# Patient Record
Sex: Female | Born: 1965 | Hispanic: No | Smoking: Current some day smoker
Health system: Southern US, Community
[De-identification: ages and names within clinical notes are randomized; demographics above are authoritative.]

## PROBLEM LIST (undated history)

## (undated) DIAGNOSIS — I2510 Atherosclerotic heart disease of native coronary artery without angina pectoris: Secondary | ICD-10-CM

## (undated) DIAGNOSIS — J45909 Unspecified asthma, uncomplicated: Secondary | ICD-10-CM

## (undated) DIAGNOSIS — F329 Major depressive disorder, single episode, unspecified: Secondary | ICD-10-CM

## (undated) DIAGNOSIS — F32A Depression, unspecified: Secondary | ICD-10-CM

## (undated) DIAGNOSIS — E049 Nontoxic goiter, unspecified: Secondary | ICD-10-CM

## (undated) DIAGNOSIS — M51369 Other intervertebral disc degeneration, lumbar region without mention of lumbar back pain or lower extremity pain: Secondary | ICD-10-CM

## (undated) DIAGNOSIS — Z87898 Personal history of other specified conditions: Secondary | ICD-10-CM

## (undated) DIAGNOSIS — M940 Chondrocostal junction syndrome [Tietze]: Secondary | ICD-10-CM

## (undated) DIAGNOSIS — E079 Disorder of thyroid, unspecified: Secondary | ICD-10-CM

## (undated) DIAGNOSIS — M5136 Other intervertebral disc degeneration, lumbar region: Secondary | ICD-10-CM

## (undated) HISTORY — DX: Depression, unspecified: F32.A

## (undated) HISTORY — DX: Atherosclerotic heart disease of native coronary artery without angina pectoris: I25.10

## (undated) HISTORY — PX: APPENDECTOMY: SHX54

## (undated) HISTORY — PX: TUBAL LIGATION: SHX77

## (undated) HISTORY — DX: Nontoxic goiter, unspecified: E04.9

## (undated) HISTORY — PX: THYROIDECTOMY: SHX17

## (undated) HISTORY — DX: Major depressive disorder, single episode, unspecified: F32.9

## (undated) HISTORY — DX: Other intervertebral disc degeneration, lumbar region without mention of lumbar back pain or lower extremity pain: M51.369

## (undated) HISTORY — DX: Chondrocostal junction syndrome (tietze): M94.0

## (undated) HISTORY — DX: Unspecified asthma, uncomplicated: J45.909

## (undated) HISTORY — DX: Personal history of other specified conditions: Z87.898

## (undated) HISTORY — DX: Disorder of thyroid, unspecified: E07.9

## (undated) HISTORY — DX: Other intervertebral disc degeneration, lumbar region: M51.36

---

## 2002-10-26 DIAGNOSIS — Z86718 Personal history of other venous thrombosis and embolism: Secondary | ICD-10-CM

## 2002-10-26 HISTORY — DX: Personal history of other venous thrombosis and embolism: Z86.718

## 2002-10-30 HISTORY — PX: VAGINAL HYSTERECTOMY: SUR661

## 2017-12-07 ENCOUNTER — Ambulatory Visit: Payer: Self-pay | Admitting: Urology

## 2017-12-07 VITALS — BP 106/70 | HR 72 | Temp 98.9°F

## 2017-12-07 DIAGNOSIS — M542 Cervicalgia: Secondary | ICD-10-CM

## 2017-12-07 DIAGNOSIS — J4520 Mild intermittent asthma, uncomplicated: Secondary | ICD-10-CM

## 2017-12-07 DIAGNOSIS — G8929 Other chronic pain: Secondary | ICD-10-CM

## 2017-12-07 DIAGNOSIS — H538 Other visual disturbances: Secondary | ICD-10-CM

## 2017-12-07 DIAGNOSIS — F319 Bipolar disorder, unspecified: Secondary | ICD-10-CM

## 2017-12-07 MED ORDER — IPRATROPIUM-ALBUTEROL 0.5-2.5 (3) MG/3ML IN SOLN: 3.0000 mL | Freq: Four times a day (QID) | RESPIRATORY_TRACT | 0 refills | Status: DC | PRN

## 2017-12-07 MED ORDER — CYCLOBENZAPRINE HCL 10 MG PO TABS: 10.0000 mg | ORAL_TABLET | Freq: Three times a day (TID) | ORAL | 0 refills | Status: DC

## 2017-12-07 MED ORDER — ALBUTEROL SULFATE HFA 108 (90 BASE) MCG/ACT IN AERS: 2.0000 | INHALATION_SPRAY | Freq: Four times a day (QID) | RESPIRATORY_TRACT | 2 refills | Status: DC | PRN

## 2017-12-07 NOTE — Addendum Note (Signed)
Addended by: Michiel CowboyMCGOWAN, Carlee Vonderhaar A on: 12/07/2017 08:19 PM   Modules accepted: Orders

## 2017-12-07 NOTE — Progress Notes (Signed)
Patient: Diana Galvan Female    DOB: 26-Jun-1966   51 y.o.   MRN: [id number] Visit Date: 12/07/2017  Today's Provider: Michiel CowboySHANNON Rockie Schnoor, PA-C   Chief Complaint  Patient presents with  . Headache    continuous for the past few days; concerned about vision    . Nausea   Subjective:    HPI HA started three days ago.  It starts in the lower part of her skull over the top of her skull to behind her eye on the right.  She has been dizzy.  Onset was gradual.  She is having nausea.  It is an ache.  Vision is blurry.  She has a history of migraines  Visual troubles needs to wear readers.  No loss of vision.    Neck feels like it is crooked and needs to be straightened.  She has bilateral arm numbness when she wakes up in the morning.   This has been occurring for the last two to three years.  It has been getting worse over the last 6 months.   She has pain in the upper trapezius bilaterally.  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  Been told she has scoliosis, but she doesn't believe it.    Has a history of asthma.  Has an expired inhaler and a nebulizer at home.  She has used the inhaler twice last week.  She had to use the nebulizer in 09/2017.    Has a temporary denture.  Would like to see dentist.    Has a history of bi-polar disease  Recently relocated from [place].      Allergies not on file This SmartLink is deprecated. Use AVSMEDLIST instead to display the medication list for a patient.  Review of Systems  Constitutional: Negative.   HENT: Negative.   Eyes: Positive for visual disturbance.  Respiratory: Negative.   Cardiovascular: Negative.   Gastrointestinal: Positive for nausea.  Endocrine: Negative.   Genitourinary: Negative.   Musculoskeletal: Positive for back pain, neck pain and neck stiffness.  Neurological: Positive for dizziness and headaches.  Hematological: Negative.   Psychiatric/Behavioral: Negative.     Social History   Tobacco Use  . Smoking status:  Current Every Day Smoker    Packs/day: 0.20    Types: Cigarettes  . Smokeless tobacco: Never Used  Substance Use Topics  . Alcohol use: Yes    Alcohol/week: 0.6 oz    Types: 1 Glasses of wine per week   Objective:   BP 106/70 (BP Location: Left Arm, Patient Position: Sitting, Cuff Size: Normal)   Pulse 72   Temp 98.9 F (37.2 C) (Oral)   Physical Exam Constitutional: Well nourished. Alert and oriented, No acute distress. HEENT: [place] AT, moist mucus membranes. Trachea midline, no masses. Cardiovascular: No clubbing, cyanosis, or edema. Respiratory: Normal respiratory effort, no increased work of breathing. GI: Abdomen is soft, non tender, non distended, no abdominal masses. Liver and spleen not palpable.  No hernias appreciated.  Stool sample for occult testing is not indicated.   GU: No CVA tenderness.  No bladder fullness or masses.   Skin: No rashes, bruises or suspicious lesions. Lymph: No cervical or inguinal adenopathy. Neurologic: Grossly intact, no focal deficits, moving all 4 extremities.  No severe curvature in spine.  Traps tender bilaterally.   Psychiatric: Normal mood and affect.      Assessment & Plan:   1. Visual changes  - needs an eye exam   2. Neck pain  - start  Flexeril 10 mg tid  - appointment with Dr. Justice RocherFossier  3. Headache  - ? Due to neck issues vs visual issues vs migraines  - will reassess once labs are resulted, appointment with Dr. Justice RocherFossier and eye exam  4. Asthma  - refilled albuterol inhaler and nebulizer medications  5. Bi-polar disease  - needs appointment with Heather  Labs drawn today - CBC, TSH, lipids, CMP and HbgA1c     Jakyren Fluegge, PA-C   [facility] [place]

## 2017-12-08 LAB — TSH: TSH: 0.833 u[IU]/mL (ref 0.450–4.500)

## 2017-12-08 LAB — HEMOGLOBIN A1C
Est. average glucose Bld gHb Est-mCnc: 103 mg/dL
Hgb A1c MFr Bld: 5.2 % (ref 4.8–5.6)

## 2017-12-08 LAB — CBC WITH DIFFERENTIAL/PLATELET
BASOS ABS: 0 10*3/uL (ref 0.0–0.2)
BASOS: 0 %
EOS (ABSOLUTE): 0.2 10*3/uL (ref 0.0–0.4)
Eos: 2 %
HEMOGLOBIN: 10.8 g/dL — AB (ref 11.1–15.9)
Hematocrit: 33.3 % — ABNORMAL LOW (ref 34.0–46.6)
IMMATURE GRANS (ABS): 0 10*3/uL (ref 0.0–0.1)
IMMATURE GRANULOCYTES: 0 %
LYMPHS: 50 %
Lymphocytes Absolute: 3.5 10*3/uL — ABNORMAL HIGH (ref 0.7–3.1)
MCH: 28.7 pg (ref 26.6–33.0)
MCHC: 32.4 g/dL (ref 31.5–35.7)
MCV: 89 fL (ref 79–97)
MONOCYTES: 5 %
Monocytes Absolute: 0.4 10*3/uL (ref 0.1–0.9)
NEUTROS ABS: 3.1 10*3/uL (ref 1.4–7.0)
NEUTROS PCT: 43 %
PLATELETS: 353 10*3/uL (ref 150–379)
RBC: 3.76 x10E6/uL — ABNORMAL LOW (ref 3.77–5.28)
RDW: 13.7 % (ref 12.3–15.4)
WBC: 7.1 10*3/uL (ref 3.4–10.8)

## 2017-12-08 LAB — LIPID PANEL
CHOLESTEROL TOTAL: 181 mg/dL (ref 100–199)
Chol/HDL Ratio: 2.9 ratio (ref 0.0–4.4)
HDL: 63 mg/dL (ref >39)
LDL CALC: 98 mg/dL (ref 0–99)
TRIGLYCERIDES: 100 mg/dL (ref 0–149)
VLDL CHOLESTEROL CAL: 20 mg/dL (ref 5–40)

## 2017-12-08 LAB — COMPREHENSIVE METABOLIC PANEL
ALBUMIN: 4.7 g/dL (ref 3.5–5.5)
ALT: 19 IU/L (ref 0–32)
AST: 23 IU/L (ref 0–40)
Albumin/Globulin Ratio: 1.9 (ref 1.2–2.2)
Alkaline Phosphatase: 79 IU/L (ref 39–117)
BUN / CREAT RATIO: 21 (ref 9–23)
BUN: 15 mg/dL (ref 6–24)
Bilirubin Total: <0.2 mg/dL (ref 0.0–1.2)
CALCIUM: 9.3 mg/dL (ref 8.7–10.2)
CHLORIDE: 106 mmol/L (ref 96–106)
CO2: 23 mmol/L (ref 20–29)
CREATININE: 0.71 mg/dL (ref 0.57–1.00)
GFR, EST AFRICAN AMERICAN: 113 mL/min/{1.73_m2} (ref >59)
GFR, EST NON AFRICAN AMERICAN: 98 mL/min/{1.73_m2} (ref >59)
GLUCOSE: 86 mg/dL (ref 65–99)
Globulin, Total: 2.5 g/dL (ref 1.5–4.5)
Potassium: 4.6 mmol/L (ref 3.5–5.2)
Sodium: 144 mmol/L (ref 134–144)
TOTAL PROTEIN: 7.2 g/dL (ref 6.0–8.5)

## 2017-12-20 ENCOUNTER — Ambulatory Visit: Payer: Self-pay | Admitting: Pharmacy Technician

## 2017-12-20 DIAGNOSIS — Z79899 Other long term (current) drug therapy: Secondary | ICD-10-CM

## 2017-12-20 NOTE — Progress Notes (Signed)
Completed [facility] application and contract.  Patient agreed to all terms of the [facility] contract.    Patient approved to receive medication assistance at [facility], as long as eligibility criteria continues to be met.    Provided patient with community resource material based on her particular needs.    [facility]

## 2017-12-21 ENCOUNTER — Ambulatory Visit: Payer: Self-pay | Admitting: Ophthalmology

## 2017-12-27 ENCOUNTER — Ambulatory Visit: Payer: Self-pay | Admitting: Chiropractor

## 2017-12-28 ENCOUNTER — Ambulatory Visit: Payer: Self-pay | Admitting: Ophthalmology

## 2018-01-05 ENCOUNTER — Telehealth: Payer: Self-pay

## 2018-01-05 NOTE — Telephone Encounter (Signed)
-----   Message from Harle BattiestShannon A McGowan, PA-C sent at 01/04/2018  6:53 PM EST ----- Please let Mrs. Antionette CharHayward that she is anemic.  Does she have a history of anemia?  If not, she needs a return appointment.

## 2018-01-05 NOTE — Telephone Encounter (Signed)
Left mess to call 

## 2018-01-08 NOTE — Telephone Encounter (Signed)
Made pt aware of lab results but pt is a [facility] pt. Pt had several questions about anemia and her past dx. Did not make pt a f/u appt with [facility].

## 2018-01-09 ENCOUNTER — Ambulatory Visit: Payer: Self-pay

## 2018-01-10 ENCOUNTER — Ambulatory Visit: Payer: Self-pay | Admitting: Chiropractor

## 2018-01-18 ENCOUNTER — Ambulatory Visit: Payer: Self-pay | Admitting: Ophthalmology

## 2018-01-24 ENCOUNTER — Encounter: Payer: Self-pay | Admitting: Chiropractor

## 2018-01-24 ENCOUNTER — Ambulatory Visit: Payer: Self-pay | Admitting: Ophthalmology

## 2018-01-24 ENCOUNTER — Ambulatory Visit: Payer: Self-pay | Admitting: Chiropractor

## 2018-01-24 ENCOUNTER — Ambulatory Visit: Payer: Self-pay | Admitting: Internal Medicine

## 2018-01-24 VITALS — BP 111/73 | HR 68 | Temp 98.3°F | Wt 245.8 lb

## 2018-01-24 DIAGNOSIS — M9906 Segmental and somatic dysfunction of lower extremity: Secondary | ICD-10-CM | POA: Insufficient documentation

## 2018-01-24 DIAGNOSIS — M9903 Segmental and somatic dysfunction of lumbar region: Secondary | ICD-10-CM | POA: Insufficient documentation

## 2018-01-24 DIAGNOSIS — M62830 Muscle spasm of back: Secondary | ICD-10-CM | POA: Insufficient documentation

## 2018-01-24 DIAGNOSIS — M9901 Segmental and somatic dysfunction of cervical region: Secondary | ICD-10-CM | POA: Insufficient documentation

## 2018-01-24 DIAGNOSIS — D649 Anemia, unspecified: Secondary | ICD-10-CM

## 2018-01-24 DIAGNOSIS — M25511 Pain in right shoulder: Secondary | ICD-10-CM

## 2018-01-24 NOTE — Progress Notes (Signed)
   Subjective:    Patient ID: Diana Galvan, female    DOB: 1966-12-06, 52 y.o.   MRN: [id number]  HPI   Pt is here for a f/u. She is concerned about weight gain and states that she has gained 25 lbs since Oct. 2018.   She also stated that she is having right shoulder pain. She had an xray done about a year ago and they found a bone spur.   She has a previous diagnosis of diverticulitis.   She had a colonoscopy done and had a polyp removed. She also has hemorrhoids.   Pt has urinary incontinence that was not fixed with her hysterectomy.  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 She has a family history of congestive heart failure.  Allergies as of 01/24/2018   Not on File     Medication List        Accurate as of 01/24/18 11:16 AM. Always use your most recent med list.          albuterol 108 (90 Base) MCG/ACT inhaler Commonly known as:  PROVENTIL HFA;VENTOLIN HFA Inhale 2 puffs into the lungs every 6 (six) hours as needed for wheezing or shortness of breath.   cyclobenzaprine 10 MG tablet Commonly known as:  FLEXERIL Take 1 tablet (10 mg total) by mouth 3 (three) times daily.   ipratropium-albuterol 0.5-2.5 (3) MG/3ML Soln Commonly known as:  DUONEB Take 3 mLs by nebulization every 6 (six) hours as needed.      Patient Active Problem List   Diagnosis Date Noted  . Segmental dysfunction of cervical region 01/24/2018  . Muscle spasm of back 01/24/2018  . Segmental dysfunction of lumbar region 01/24/2018  . Segmental dysfunction of lower extremity 01/24/2018     Review of Systems     Objective:   Physical Exam  Constitutional: She is oriented to person, place, and time.  Cardiovascular: Normal rate, regular rhythm and normal heart sounds.  Pulmonary/Chest: Effort normal and breath sounds normal.  Neurological: She is alert and oriented to person, place, and time.   BP 111/73   Pulse 68   Temp 98.3 F (36.8 C)   Wt 245 lb 12.8 oz (111.5 kg)       Assessment & Plan:   Labs ordered today: CBC, B12, TIBC, iron serum, folic acid, reticulocyte (retic), UA  Get [place] records from [place] (she recently moved here).   Order x-ray for right shoulder pain. Pt needs to fill out charity care application. Referral for orthopedic (Dr. Justice RocherFossier).

## 2018-01-24 NOTE — Progress Notes (Signed)
S: Patient presents with right sided neck pain. Patient states shes had it for years, however much worse over the last year. Pain is made worse by laying on back, looking down, and having sex. Made better by aromatherapy (doterra oils). Pain is achey and sharp. Pain radiates into right shoulder along the upper trap. Pain is an 8/10. Pain is constant and severe.   Patient also has right hip pain. Patient states she has also had this for years, made worse in the last year. Pain made worse by walking, sitting, standing up, twisting. Patient states sometimes she can twist and the pain will bring her to her knees. Pain made better by stretching. Pain is sharp. Pain does not radiate. Pain is a 3/10 usually, however becomes 10/10 when she moves wrong. Pain is constant and can be severe.   No loss of bowel or bladder control.   O:  Posture - Right head tilt, left high shoulder, right high hip, anterior head carriage, increased lumbar lordosis.   ROM - Csp and Lsp AROM WNL no pain.   Segmental - C2 L, C6R, T2 R, T8 R, L5 R, SI R, Hip R  TTF - Csp L, Tsp R, Lsp R paraspinals.   Pain - Pain at all segments listed above and in muscles listed above.   Ortho - SLR, braggards, kemps, Csp & Lsp valsalva, yeomans, hibbs, max foraminal csp compression, Csp distraction all negative. Skipper ClichePatrick FABERE positive on right.  Neuro -  Strength C5-T1 and L4-S1 were 5 Reflex C5-T1 and L4-S1 were 2 Sensory - C5-T1 and L4-S1 normal bilateral.  A: Prognosis is guarded. Patient's first visit. 1x EOW as available in clinic.  P: SMT at levels listed above. Ext. Manipulation of right hip. ROF completed prior to Tx. PARNB.

## 2018-01-25 LAB — URINALYSIS
BILIRUBIN UA: NEGATIVE
Glucose, UA: NEGATIVE
Ketones, UA: NEGATIVE
Leukocytes, UA: NEGATIVE
Nitrite, UA: NEGATIVE
PH UA: 6 (ref 5.0–7.5)
Protein, UA: NEGATIVE
Specific Gravity, UA: 1.023 (ref 1.005–1.030)
Urobilinogen, Ur: 0.2 mg/dL (ref 0.2–1.0)

## 2018-01-31 ENCOUNTER — Other Ambulatory Visit: Payer: Self-pay

## 2018-02-02 ENCOUNTER — Telehealth: Payer: Self-pay

## 2018-02-02 NOTE — Telephone Encounter (Signed)
Called to r/s pt lab appt for 2/13. Lm for pt to call back to confirm

## 2018-02-07 ENCOUNTER — Ambulatory Visit: Payer: Self-pay | Admitting: Chiropractor

## 2018-02-07 ENCOUNTER — Encounter: Payer: Self-pay | Admitting: Chiropractor

## 2018-02-07 ENCOUNTER — Other Ambulatory Visit: Payer: Self-pay

## 2018-02-07 DIAGNOSIS — M9906 Segmental and somatic dysfunction of lower extremity: Secondary | ICD-10-CM

## 2018-02-07 DIAGNOSIS — M9903 Segmental and somatic dysfunction of lumbar region: Secondary | ICD-10-CM

## 2018-02-07 DIAGNOSIS — M9901 Segmental and somatic dysfunction of cervical region: Secondary | ICD-10-CM

## 2018-02-07 DIAGNOSIS — M62830 Muscle spasm of back: Secondary | ICD-10-CM

## 2018-02-07 DIAGNOSIS — J45909 Unspecified asthma, uncomplicated: Secondary | ICD-10-CM

## 2018-02-07 MED ORDER — IPRATROPIUM-ALBUTEROL 0.5-2.5 (3) MG/3ML IN SOLN: 3.0000 mL | Freq: Four times a day (QID) | RESPIRATORY_TRACT | 1 refills | Status: DC | PRN

## 2018-02-07 MED ORDER — ALBUTEROL SULFATE HFA 108 (90 BASE) MCG/ACT IN AERS: 2.0000 | INHALATION_SPRAY | Freq: Four times a day (QID) | RESPIRATORY_TRACT | 2 refills | Status: DC | PRN

## 2018-02-07 NOTE — Progress Notes (Signed)
S: Patient presents with right sided neck pain. Patient states it is 50% better since LPV. Patient states shes had it for years, however much worse over the last year. Pain is made worse by laying on back, looking down, and having sex. Made better by aromatherapy (doterra oils). Pain is achey and sharp. Pain radiates into right shoulder along the upper trap. Pain is an 4/10. Pain is constant and severe.   Patient also has right hip pain. Patient states she has also had this for years, made worse in the last year. Patient states it is considerably better since LPV. Pain made worse by walking, sitting, standing up, twisting. Patient states sometimes she can twist and the pain will bring her to her knees. Pain made better by stretching. Pain is sharp. Pain does not radiate. Pain is a 2/10 usually, however becomes 6/10 when she moves wrong. Pain is constant and can be severe.   No loss of bowel or bladder control.   Patient states when she sweats or goes under how water, the exposed skin becomes extremely itchy. Patient also states that since she moved to this area, whenever she eats her tongue burns.  O:  Posture - Right head tilt, left high shoulder, right high hip, anterior head carriage, increased lumbar lordosis.   ROM - Csp and Lsp AROM WNL no pain.   Segmental - C2 L, C6R, T2 R, T8 R, L5 R, SI R, Hip R  TTF - Csp L, Tsp R, Lsp R paraspinals.   Pain - Pain at all segments listed above and in muscles listed above.   Ortho - SLR, braggards, kemps, Csp & Lsp valsalva, yeomans, hibbs, max foraminal csp compression, Csp distraction all negative. Skipper ClichePatrick FABERE positive on right.  Neuro -  Strength C5-T1 and L4-S1 were 5 Reflex C5-T1 and L4-S1 were 2 Sensory - C5-T1 and L4-S1 normal bilateral.  A: Prognosis is good. Patient's first visit. 1x EOW as available in clinic.  P: SMT at levels listed above. Ext. Manipulation of right hip. ROF completed prior to Tx. PARNB. Referred patient to Dr.  Candelaria Stagershaplin for skin itching and tongue burning.

## 2018-02-14 ENCOUNTER — Ambulatory Visit: Payer: Self-pay | Admitting: Internal Medicine

## 2018-02-14 ENCOUNTER — Ambulatory Visit: Payer: Self-pay | Admitting: Ophthalmology

## 2018-02-21 ENCOUNTER — Telehealth: Payer: Self-pay

## 2018-02-21 NOTE — Telephone Encounter (Signed)
Lm with next avail am appts for dr hatch and dr Candelaria Stagerschaplin. Nothing avail until 4/3. Asked for a call back to schedule

## 2018-03-05 ENCOUNTER — Telehealth: Payer: Self-pay | Admitting: Pharmacy Technician

## 2018-03-05 NOTE — Telephone Encounter (Signed)
Patient failed to provide 2019 financial documentation.  No additional medication assistance will be provided by [facility] without the required proof of income documentation.  Patient notified by letter.  [place] J. Geza Beranek Care Manager [facility] 

## 2018-03-29 ENCOUNTER — Ambulatory Visit: Payer: Self-pay

## 2018-04-05 ENCOUNTER — Ambulatory Visit: Payer: Self-pay

## 2018-04-12 ENCOUNTER — Ambulatory Visit: Payer: Self-pay

## 2018-08-22 DIAGNOSIS — F319 Bipolar disorder, unspecified: Secondary | ICD-10-CM | POA: Insufficient documentation

## 2018-08-22 DIAGNOSIS — Z87898 Personal history of other specified conditions: Secondary | ICD-10-CM | POA: Insufficient documentation

## 2018-08-22 DIAGNOSIS — Z972 Presence of dental prosthetic device (complete) (partial): Secondary | ICD-10-CM | POA: Insufficient documentation

## 2018-08-22 DIAGNOSIS — Z91419 Personal history of unspecified adult abuse: Secondary | ICD-10-CM | POA: Insufficient documentation

## 2018-08-22 DIAGNOSIS — F1991 Other psychoactive substance use, unspecified, in remission: Secondary | ICD-10-CM | POA: Insufficient documentation

## 2018-08-22 LAB — HM HIV SCREENING LAB: HM HIV Screening: NEGATIVE

## 2018-09-03 DIAGNOSIS — A539 Syphilis, unspecified: Secondary | ICD-10-CM | POA: Insufficient documentation

## 2018-10-25 ENCOUNTER — Other Ambulatory Visit: Payer: Self-pay

## 2018-10-25 DIAGNOSIS — Z0000 Encounter for general adult medical examination without abnormal findings: Secondary | ICD-10-CM

## 2018-12-05 ENCOUNTER — Ambulatory Visit: Payer: Self-pay | Admitting: Ophthalmology

## 2019-03-28 ENCOUNTER — Other Ambulatory Visit: Payer: Self-pay

## 2019-03-28 ENCOUNTER — Encounter: Payer: Self-pay | Admitting: Gerontology

## 2019-03-28 ENCOUNTER — Ambulatory Visit: Payer: Self-pay | Admitting: Gerontology

## 2019-03-28 DIAGNOSIS — Z8719 Personal history of other diseases of the digestive system: Secondary | ICD-10-CM

## 2019-03-28 DIAGNOSIS — K6289 Other specified diseases of anus and rectum: Secondary | ICD-10-CM

## 2019-03-28 DIAGNOSIS — R05 Cough: Secondary | ICD-10-CM

## 2019-03-28 DIAGNOSIS — R059 Cough, unspecified: Secondary | ICD-10-CM

## 2019-03-28 DIAGNOSIS — J4520 Mild intermittent asthma, uncomplicated: Secondary | ICD-10-CM

## 2019-03-28 DIAGNOSIS — M25511 Pain in right shoulder: Secondary | ICD-10-CM

## 2019-03-28 DIAGNOSIS — Z0000 Encounter for general adult medical examination without abnormal findings: Secondary | ICD-10-CM

## 2019-03-28 MED ORDER — BENZONATATE 100 MG PO CAPS: 100.0000 mg | ORAL_CAPSULE | Freq: Three times a day (TID) | ORAL | 0 refills | Status: DC | PRN

## 2019-03-28 MED ORDER — WITCH HAZEL-GLYCERIN EX PADS: 1.0000 "application " | MEDICATED_PAD | CUTANEOUS | 0 refills | Status: DC | PRN

## 2019-03-28 MED ORDER — CYCLOBENZAPRINE HCL 10 MG PO TABS: 10.0000 mg | ORAL_TABLET | Freq: Every day | ORAL | 0 refills | Status: DC

## 2019-03-28 NOTE — Progress Notes (Signed)
Established Patient Office Visit  Subjective:  Patient ID: Diana Galvan, female    DOB: October 25, 1966  Age: 53 y.o. MRN: [id number]  CC:  Chief Complaint  Patient presents with  . Follow-up    asthma, cough, shortness of breath    [facility] for follow up for cough and asthma. She states that she's being having non productive cough that has being going on for 2 weeks, and it keeps her up at night. She reports that she has not taking any otc medication. She states that her temperature was 98.69F during telephone visit. She denies chest pain, palpitation, chest tightness and wheezing. She reports having intermittent shortness of breath with activity such as walking 3 blocks. She states that she uses nebulizer treatment, but  has not being using albuterol inhaler because she read that steroid should not be used during a pandemic.    Right shoulder pain: She states that she continues to experience constant 6/10 non radiating sharp pain to right shoulder. She states that it has being going on for over 5 years. She states that she was out of 10 mg Flexeril  because she didn't follow up with her appointment and the prescription expired.  Rectal pain: She states that she had a history of hemmorrhoid and had colonoscopy 2-3 years ago with the removal of a benign polyps. Information was not found. She reports that 3 weeks ago she had 3 episodes of bright red blood after having a bowel movement. She states that since then she has being having intermittent 5/10 dull rectal pain when sitting. Per patient, she was informed after the colonoscopy that she has internal and external hemorrhoid. Currently, she denies constipation, diarrhea, abdominal pain, rectal bleed or dark tarry stool , and no further concern.   Past Medical History:  Diagnosis Date  . Asthma   . Depression     Past Surgical History:  Procedure Laterality Date  . VAGINAL HYSTERECTOMY  10/30/2002     Family History  Problem Relation Age of Onset  . Mental illness Mother   . Congestive Heart Failure Paternal Aunt     Social History   Socioeconomic History  . Marital status: Divorced    Spouse name: Not on file  . Number of children: Not on file  . Years of education: Not on file  . Highest education level: Not on file  Occupational History  . Not on file  Social Needs  . Financial resource strain: Not on file  . Food insecurity:    Worry: Not on file    Inability: Not on file  . Transportation needs:    Medical: Not on file    Non-medical: Not on file  Tobacco Use  . Smoking status: Current Some Day Smoker    Packs/day: 0.20    Types: Cigarettes  . Smokeless tobacco: Never Used  Substance and Sexual Activity  . Alcohol use: Yes    Alcohol/week: 1.0 standard drinks    Types: 1 Glasses of wine per week  . Drug use: No  . Sexual activity: Yes    Partners: Male  Lifestyle  . Physical activity:    Days per week: Not on file    Minutes per session: Not on file  . Stress: Not on file  Relationships  . Social connections:    Talks on phone: Not on file    Gets together: Not on file    Attends religious service: Not on file  Active member of club or organization: Not on file    Attends meetings of clubs or organizations: Not on file    Relationship status: Not on file  . Intimate partner violence:    Fear of current or ex partner: Not on file    Emotionally abused: Not on file    Physically abused: Not on file    Forced sexual activity: Not on file  Other Topics Concern  . Not on file  Social History Narrative  . Not on file    Outpatient Medications Prior to Visit  Medication Sig Dispense Refill  . albuterol (PROVENTIL HFA;VENTOLIN HFA) 108 (90 Base) MCG/ACT inhaler Inhale 2 puffs into the lungs every 6 (six) hours as needed for wheezing or shortness of breath. 1 Inhaler 2  . ipratropium-albuterol (DUONEB) 0.5-2.5 (3) MG/3ML SOLN Take 3 mLs by  nebulization every 6 (six) hours as needed. 360 mL 1  . cyclobenzaprine (FLEXERIL) 10 MG tablet Take 1 tablet (10 mg total) by mouth 3 (three) times daily. (Patient not taking: Reported on 03/28/2019) 30 tablet 0   No facility-administered medications prior to visit.     No Known Allergies  ROS Review of Systems  Constitutional: Negative for chills and fever.  HENT: Positive for sinus pressure. Negative for rhinorrhea, sneezing, sore throat and tinnitus.   Respiratory: Positive for cough (non productive cough for 2 weeks) and shortness of breath (intermittent with activity). Negative for chest tightness and wheezing.   Cardiovascular: Negative.   Musculoskeletal: Positive for arthralgias (chronic right shoulder pain).  Skin: Negative.   Neurological: Negative.   Psychiatric/Behavioral: Negative.       Objective:    Physical Exam   No vital signs and Physical exam was done, visit was via Telephone.  There were no vitals taken for this visit. Wt Readings from Last 3 Encounters:  01/24/18 245 lb 12.8 oz (111.5 kg)     Health Maintenance Due  Topic Date Due  . HIV Screening  11/14/1980  . TETANUS/TDAP  11/14/1984  . PAP SMEAR-Modifier  11/14/1986  . MAMMOGRAM  11/15/2015  . COLONOSCOPY  11/15/2015    There are no preventive care reminders to display for this patient.  Lab Results  Component Value Date   TSH 0.833 12/07/2017   Lab Results  Component Value Date   WBC 7.1 12/07/2017   HGB 10.8 (L) 12/07/2017   HCT 33.3 (L) 12/07/2017   MCV 89 12/07/2017   PLT 353 12/07/2017   Lab Results  Component Value Date   NA 144 12/07/2017   K 4.6 12/07/2017   CO2 23 12/07/2017   GLUCOSE 86 12/07/2017   BUN 15 12/07/2017   CREATININE 0.71 12/07/2017   BILITOT <0.2 12/07/2017   ALKPHOS 79 12/07/2017   AST 23 12/07/2017   ALT 19 12/07/2017   PROT 7.2 12/07/2017   ALBUMIN 4.7 12/07/2017   CALCIUM 9.3 12/07/2017   Lab Results  Component Value Date   CHOL 181  12/07/2017   Lab Results  Component Value Date   HDL 63 12/07/2017   Lab Results  Component Value Date   LDLCALC 98 12/07/2017   Lab Results  Component Value Date   TRIG 100 12/07/2017   Lab Results  Component Value Date   CHOLHDL 2.9 12/07/2017   Lab Results  Component Value Date   HGBA1C 5.2 12/07/2017      Assessment & Plan:      1. Cough - She will continue on benzonatate, she will notify  provider for worsening symptoms. - benzonatate (TESSALON PERLES) 100 MG capsule; Take 1 capsule (100 mg total) by mouth 3 (three) times daily as needed for cough.  Dispense: 20 capsule; Refill: 0  2. Preventative health care - Routine labs will be collected next week - Urinalysis; Future - TSH; Future - Lipid panel; Future - HgB A1c; Future - Comp Met (CMET); Future - CBC w/Diff; Future - Lipid panel; Future - B12; Future  3. Rectal pain - She was encouraged to complete charity care application for Ambulatory referral to Gastroenterology  4. Mild intermittent asthma without complication - She was encouraged to use Albuterol as needed.  5. Right shoulder pain, unspecified chronicity - She will follow up with Dr Vickki Hearing [facility] - cyclobenzaprine (FLEXERIL) 10 MG tablet; Take 1 tablet (10 mg total) by mouth at bedtime.  Dispense: 30 tablet; Refill: 0  6. History of hemorrhoids -She will continue witch hazel, and notify provider for worsening symptoms.  She was educated on medication side effect and advised to notify clinic. - witch hazel-glycerin (TUCKS) pad; Apply 1 application topically as needed for itching or hemorrhoids.  Dispense: 40 each; Refill: 0  Follow-up: Return in about 2 weeks (around 04/11/2019), or if symptoms worsen or fail to improve.    Junaid Wurzer Jerold Coombe, NP

## 2019-03-29 ENCOUNTER — Encounter: Payer: Self-pay | Admitting: Gastroenterology

## 2019-04-01 ENCOUNTER — Other Ambulatory Visit: Payer: Self-pay

## 2019-04-01 ENCOUNTER — Encounter: Payer: Self-pay | Admitting: *Deleted

## 2019-04-01 ENCOUNTER — Ambulatory Visit: Payer: Self-pay | Admitting: Gastroenterology

## 2019-04-02 ENCOUNTER — Other Ambulatory Visit: Payer: Self-pay

## 2019-04-02 DIAGNOSIS — Z8719 Personal history of other diseases of the digestive system: Secondary | ICD-10-CM

## 2019-04-02 DIAGNOSIS — J45909 Unspecified asthma, uncomplicated: Secondary | ICD-10-CM

## 2019-04-02 MED ORDER — IPRATROPIUM-ALBUTEROL 0.5-2.5 (3) MG/3ML IN SOLN: 3.0000 mL | Freq: Four times a day (QID) | RESPIRATORY_TRACT | 1 refills | Status: DC | PRN

## 2019-04-02 MED ORDER — ALBUTEROL SULFATE HFA 108 (90 BASE) MCG/ACT IN AERS: 2.0000 | INHALATION_SPRAY | Freq: Four times a day (QID) | RESPIRATORY_TRACT | 2 refills | Status: DC | PRN

## 2019-04-02 MED ORDER — WITCH HAZEL-GLYCERIN EX PADS: 1.0000 "application " | MEDICATED_PAD | CUTANEOUS | 0 refills | Status: DC | PRN

## 2019-04-02 NOTE — Progress Notes (Signed)
Pt asked for medication to be sent to different pharmacy.

## 2019-04-03 ENCOUNTER — Other Ambulatory Visit: Payer: Self-pay

## 2019-05-01 ENCOUNTER — Other Ambulatory Visit: Payer: Self-pay

## 2019-05-01 DIAGNOSIS — Z0000 Encounter for general adult medical examination without abnormal findings: Secondary | ICD-10-CM

## 2019-05-03 LAB — CBC WITH DIFFERENTIAL/PLATELET
Basophils Absolute: 0 10*3/uL (ref 0.0–0.2)
Basos: 0 %
EOS (ABSOLUTE): 0.1 10*3/uL (ref 0.0–0.4)
Eos: 1 %
Hematocrit: 35 % (ref 34.0–46.6)
Hemoglobin: 11.5 g/dL (ref 11.1–15.9)
Immature Grans (Abs): 0 10*3/uL (ref 0.0–0.1)
Immature Granulocytes: 0 %
Lymphocytes Absolute: 4 10*3/uL — ABNORMAL HIGH (ref 0.7–3.1)
Lymphs: 43 %
MCH: 28.7 pg (ref 26.6–33.0)
MCHC: 32.9 g/dL (ref 31.5–35.7)
MCV: 87 fL (ref 79–97)
Monocytes Absolute: 0.4 10*3/uL (ref 0.1–0.9)
Monocytes: 4 %
Neutrophils Absolute: 4.8 10*3/uL (ref 1.4–7.0)
Neutrophils: 52 %
Platelets: 409 10*3/uL (ref 150–450)
RBC: 4.01 x10E6/uL (ref 3.77–5.28)
RDW: 13.1 % (ref 11.7–15.4)
WBC: 9.3 10*3/uL (ref 3.4–10.8)

## 2019-05-03 LAB — COMPREHENSIVE METABOLIC PANEL
ALT: 13 IU/L (ref 0–32)
AST: 18 IU/L (ref 0–40)
Albumin/Globulin Ratio: 1.8 (ref 1.2–2.2)
Albumin: 4.6 g/dL (ref 3.8–4.9)
Alkaline Phosphatase: 87 IU/L (ref 39–117)
BUN/Creatinine Ratio: 21 (ref 9–23)
BUN: 20 mg/dL (ref 6–24)
Bilirubin Total: 0.2 mg/dL (ref 0.0–1.2)
CO2: 22 mmol/L (ref 20–29)
Calcium: 9.4 mg/dL (ref 8.7–10.2)
Chloride: 103 mmol/L (ref 96–106)
Creatinine, Ser: 0.96 mg/dL (ref 0.57–1.00)
GFR calc Af Amer: 78 mL/min/{1.73_m2} (ref >59)
GFR calc non Af Amer: 68 mL/min/{1.73_m2} (ref >59)
Globulin, Total: 2.5 g/dL (ref 1.5–4.5)
Glucose: 87 mg/dL (ref 65–99)
Potassium: 3.7 mmol/L (ref 3.5–5.2)
Sodium: 140 mmol/L (ref 134–144)
Total Protein: 7.1 g/dL (ref 6.0–8.5)

## 2019-05-03 LAB — URINALYSIS
Bilirubin, UA: NEGATIVE
Glucose, UA: NEGATIVE
Ketones, UA: NEGATIVE
Leukocytes,UA: NEGATIVE
Nitrite, UA: NEGATIVE
Protein,UA: NEGATIVE
Specific Gravity, UA: >=1.03 — AB (ref 1.005–1.030)
Urobilinogen, Ur: 1 mg/dL (ref 0.2–1.0)
pH, UA: 5 (ref 5.0–7.5)

## 2019-05-03 LAB — TSH: TSH: 2.69 u[IU]/mL (ref 0.450–4.500)

## 2019-05-03 LAB — LIPID PANEL
Chol/HDL Ratio: 3.5 ratio (ref 0.0–4.4)
Cholesterol, Total: 176 mg/dL (ref 100–199)
HDL: 51 mg/dL (ref >39)
LDL Calculated: 109 mg/dL — ABNORMAL HIGH (ref 0–99)
Triglycerides: 78 mg/dL (ref 0–149)
VLDL Cholesterol Cal: 16 mg/dL (ref 5–40)

## 2019-05-03 LAB — VITAMIN B12: Vitamin B-12: 898 pg/mL (ref 232–1245)

## 2019-05-03 LAB — HEMOGLOBIN A1C
Est. average glucose Bld gHb Est-mCnc: 108 mg/dL
Hgb A1c MFr Bld: 5.4 % (ref 4.8–5.6)

## 2019-05-09 ENCOUNTER — Ambulatory Visit: Payer: Self-pay | Admitting: Gerontology

## 2019-05-09 ENCOUNTER — Other Ambulatory Visit: Payer: Self-pay

## 2019-05-09 DIAGNOSIS — J45909 Unspecified asthma, uncomplicated: Secondary | ICD-10-CM

## 2019-05-09 DIAGNOSIS — R059 Cough, unspecified: Secondary | ICD-10-CM

## 2019-05-09 DIAGNOSIS — R05 Cough: Secondary | ICD-10-CM

## 2019-05-09 DIAGNOSIS — R899 Unspecified abnormal finding in specimens from other organs, systems and tissues: Secondary | ICD-10-CM

## 2019-05-09 MED ORDER — PREDNISONE 5 MG PO TABS: 20.0000 mg | ORAL_TABLET | Freq: Every day | ORAL | 0 refills | Status: DC

## 2019-05-09 MED ORDER — MONTELUKAST SODIUM 10 MG PO TABS: 10.0000 mg | ORAL_TABLET | Freq: Every day | ORAL | 1 refills | Status: DC

## 2019-05-09 MED ORDER — FLUTICASONE-SALMETEROL 250-50 MCG/DOSE IN AEPB: 1.0000 | INHALATION_SPRAY | Freq: Every day | RESPIRATORY_TRACT | 1 refills | Status: DC

## 2019-05-09 MED ORDER — BENZONATATE 100 MG PO CAPS: 100.0000 mg | ORAL_CAPSULE | Freq: Three times a day (TID) | ORAL | 0 refills | Status: DC | PRN

## 2019-05-09 NOTE — Progress Notes (Signed)
Established Patient Office Visit  Subjective:  Patient ID: Diana Galvan, female    DOB: Feb 27, 1966  Age: 53 y.o. MRN: [id number]  CC:  Chief Complaint  Patient presents with  . Follow-up    cough, voice is hoarse  Patient consents to telephone visit and 2 patient identifier was used to identify patient.  [facility] follow up for cough that has being going on since 03/15/19. She reports that sometimes she coughs up minimal amount of whitish phlegm, and most of the time her cough is dry. She reports experiencing mild intermittent wheezing and shortness of breath when walking more than 1 block, though she states that she has been sedentary for the past 2 months. She has a history of Asthma and states that she has being using her albuterol every 6 hours. She reports that she was using Advair 250 which relieves her symptoms, but she lost her insurance and has not used it for the past 4 years, and no record of medication was found. She checked her temperature during visit and it was 97.83F. She denies nasal and sinus congestion, rhinorrhea, fever, chills, sore throat, and no alteration in taste and sense of smell.   Her lab results done on 05/01/19 was reviewed, LDL was 109 mg/dl, and 2+ RBC in urine. She reports making life style modifications. She denies chest pain, palpitation and no further concerns.     Past Medical History:  Diagnosis Date  . Asthma   . Depression     Past Surgical History:  Procedure Laterality Date  . VAGINAL HYSTERECTOMY  10/30/2002    Family History  Problem Relation Age of Onset  . Mental illness Mother   . Congestive Heart Failure Paternal Aunt     Social History   Socioeconomic History  . Marital status: Divorced    Spouse name: Not on file  . Number of children: Not on file  . Years of education: Not on file  . Highest education level: Not on file  Occupational History  . Not on file  Social Needs  .  Financial resource strain: Not on file  . Food insecurity:    Worry: Not on file    Inability: Not on file  . Transportation needs:    Medical: Not on file    Non-medical: Not on file  Tobacco Use  . Smoking status: Current Some Day Smoker    Packs/day: 0.20    Types: Cigarettes  . Smokeless tobacco: Never Used  Substance and Sexual Activity  . Alcohol use: Yes    Alcohol/week: 1.0 standard drinks    Types: 1 Glasses of wine per week  . Drug use: No  . Sexual activity: Yes    Partners: Male  Lifestyle  . Physical activity:    Days per week: Not on file    Minutes per session: Not on file  . Stress: Not on file  Relationships  . Social connections:    Talks on phone: Not on file    Gets together: Not on file    Attends religious service: Not on file    Active member of club or organization: Not on file    Attends meetings of clubs or organizations: Not on file    Relationship status: Not on file  . Intimate partner violence:    Fear of current or ex partner: Not on file    Emotionally abused: Not on file    Physically abused: Not on file  Forced sexual activity: Not on file  Other Topics Concern  . Not on file  Social History Narrative  . Not on file    Outpatient Medications Prior to Visit  Medication Sig Dispense Refill  . albuterol (PROVENTIL HFA;VENTOLIN HFA) 108 (90 Base) MCG/ACT inhaler Inhale 2 puffs into the lungs every 6 (six) hours as needed for wheezing or shortness of breath. 1 Inhaler 2  . cyclobenzaprine (FLEXERIL) 10 MG tablet Take 1 tablet (10 mg total) by mouth at bedtime. 30 tablet 0  . ipratropium-albuterol (DUONEB) 0.5-2.5 (3) MG/3ML SOLN Take 3 mLs by nebulization every 6 (six) hours as needed. 360 mL 1  . witch hazel-glycerin (TUCKS) pad Apply 1 application topically as needed for itching or hemorrhoids. 40 each 0  . benzonatate (TESSALON PERLES) 100 MG capsule Take 1 capsule (100 mg total) by mouth 3 (three) times daily as needed for cough. 20  capsule 0   No facility-administered medications prior to visit.     No Known Allergies  ROS Review of Systems  Constitutional: Negative.   HENT: Negative.   Respiratory: Positive for cough, shortness of breath and wheezing (intermittent slight wheezing). Negative for chest tightness.   Cardiovascular: Negative.   Gastrointestinal: Negative.   Neurological: Negative.   Psychiatric/Behavioral: Negative.       Objective:    Physical Exam No vital sign and PE was done. There were no vitals taken for this visit. Wt Readings from Last 3 Encounters:  01/24/18 245 lb 12.8 oz (111.5 kg)     Health Maintenance Due  Topic Date Due  . HIV Screening  11/14/1980  . TETANUS/TDAP  11/14/1984  . PAP SMEAR-Modifier  11/14/1986  . MAMMOGRAM  11/15/2015  . COLONOSCOPY  11/15/2015    There are no preventive care reminders to display for this patient.  Lab Results  Component Value Date   TSH 2.690 05/01/2019   Lab Results  Component Value Date   WBC 9.3 05/01/2019   HGB 11.5 05/01/2019   HCT 35.0 05/01/2019   MCV 87 05/01/2019   PLT 409 05/01/2019   Lab Results  Component Value Date   NA 140 05/01/2019   K 3.7 05/01/2019   CO2 22 05/01/2019   GLUCOSE 87 05/01/2019   BUN 20 05/01/2019   CREATININE 0.96 05/01/2019   BILITOT 0.2 05/01/2019   ALKPHOS 87 05/01/2019   AST 18 05/01/2019   ALT 13 05/01/2019   PROT 7.1 05/01/2019   ALBUMIN 4.6 05/01/2019   CALCIUM 9.4 05/01/2019   Lab Results  Component Value Date   CHOL 176 05/01/2019   Lab Results  Component Value Date   HDL 51 05/01/2019   Lab Results  Component Value Date   LDLCALC 109 (H) 05/01/2019   Lab Results  Component Value Date   TRIG 78 05/01/2019   Lab Results  Component Value Date   CHOLHDL 3.5 05/01/2019   Lab Results  Component Value Date   HGBA1C 5.4 05/01/2019      Assessment & Plan:     1. Cough Ddx COVID 19? She was advised to go to the emergency room for worsening symptoms. -  She was advised to continue using Muccinex during the day. - benzonatate (TESSALON PERLES) 100 MG capsule; Take 1 capsule (100 mg total) by mouth 3 (three) times daily as needed for cough.  Dispense: 20 capsule; Refill: 0  2. Asthma, unspecified asthma severity, unspecified whether complicated, unspecified whether persistent - Ddx  Moderate Persistent Asthma exacerbation? COVID 19? -  montelukast (SINGULAIR) 10 MG tablet; Take 1 tablet (10 mg total) by mouth at bedtime.  Dispense: 30 tablet; Refill: 1 - predniSONE (DELTASONE) 5 MG tablet; Take 4 tablets (20 mg total) by mouth daily with breakfast.  Dispense: 10 tablet; Refill: 0 - Fluticasone-Salmeterol (ADVAIR) 250-50 MCG/DOSE AEPB; Inhale 1 puff into the lungs daily.  Dispense: 60 each; Refill: 1  3. Abnormal laboratory test -  Her LDL was 109 mg/dl and goal is <185 mg/dl.She was advised to continue on low fat low cholesterol diet, exercise as tolerated. - Urinalysis will be rechecked in 1 month, presence of hematuria, will refer to Urology.  Follow-up: Return in about 2 weeks (around 05/23/2019), or if symptoms worsen or fail to improve.    Lawanna Cecere Trellis Paganini, NP

## 2019-05-29 ENCOUNTER — Other Ambulatory Visit: Payer: Self-pay

## 2019-05-30 ENCOUNTER — Ambulatory Visit: Payer: Self-pay | Admitting: Gerontology

## 2019-05-30 ENCOUNTER — Other Ambulatory Visit: Payer: Self-pay

## 2019-05-30 DIAGNOSIS — J45909 Unspecified asthma, uncomplicated: Secondary | ICD-10-CM

## 2019-05-31 LAB — URINALYSIS
Bilirubin, UA: NEGATIVE
Glucose, UA: NEGATIVE
Ketones, UA: NEGATIVE
Leukocytes,UA: NEGATIVE
Nitrite, UA: NEGATIVE
Protein,UA: NEGATIVE
Specific Gravity, UA: >=1.03 — AB (ref 1.005–1.030)
Urobilinogen, Ur: 1 mg/dL (ref 0.2–1.0)
pH, UA: 5 (ref 5.0–7.5)

## 2019-06-06 ENCOUNTER — Ambulatory Visit: Payer: Self-pay | Admitting: Gerontology

## 2019-06-06 ENCOUNTER — Other Ambulatory Visit: Payer: Self-pay

## 2019-06-06 DIAGNOSIS — R0609 Other forms of dyspnea: Secondary | ICD-10-CM | POA: Insufficient documentation

## 2019-06-06 DIAGNOSIS — R3129 Other microscopic hematuria: Secondary | ICD-10-CM

## 2019-06-06 DIAGNOSIS — M79601 Pain in right arm: Secondary | ICD-10-CM | POA: Insufficient documentation

## 2019-06-06 DIAGNOSIS — R05 Cough: Secondary | ICD-10-CM | POA: Insufficient documentation

## 2019-06-06 DIAGNOSIS — R059 Cough, unspecified: Secondary | ICD-10-CM

## 2019-06-06 NOTE — Progress Notes (Signed)
Established Patient Office Visit  Subjective:  Patient ID: Diana Galvan, female    DOB: September 19, 1966  Age: 53 y.o. MRN: [id number]  CC:  Chief Complaint  Patient presents with  . Follow-up    pain in arm after labs drawn; cough  Patient consents to telephone visit and two patient identifiers was used to identify patient.  [facility] cough and lab review. She reports that she's going through some hard time because she's looking for an apartment after her fiance left with out notifying her. She also c/o having intermittent 4/10 sharp pain to right shoulder that radiates to her arm, and per patient, she states it might be due to her diagnosis of bone spur two years ago. She states that the pain started after blood sample was drawn from her right arm on 05/30/19.  She reports that the pain wakes her up at night and she has not taking any medication because she packed up her belongins in her car. She denies paresthesia, motor weakness, chest pain and palpitation.  She reports that she continues to experience intermittent shortness of breath when walking more than 2 blocks, but using Advair and albuterol, relieve symptoms. She denies wheezing, fever, chills and she states that her cough has greatly improved, though she experiences occasional cough with minimal amount of whitish phlegm. 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 Otherwise she has no further concern.  Past Medical History:  Diagnosis Date  . Asthma   . Depression     Past Surgical History:  Procedure Laterality Date  . VAGINAL HYSTERECTOMY  10/30/2002    Family History  Problem Relation Age of Onset  . Mental illness Mother   . Congestive Heart Failure Paternal Aunt     Social History   Socioeconomic History  . Marital status: Divorced    Spouse name: Not on file  . Number of children: Not on file  . Years of education: Not on file  .  Highest education level: Not on file  Occupational History  . Not on file  Social Needs  . Financial resource strain: Not on file  . Food insecurity    Worry: Not on file    Inability: Not on file  . Transportation needs    Medical: Not on file    Non-medical: Not on file  Tobacco Use  . Smoking status: Current Some Day Smoker    Packs/day: 0.20    Types: Cigarettes  . Smokeless tobacco: Never Used  Substance and Sexual Activity  . Alcohol use: Yes    Alcohol/week: 1.0 standard drinks    Types: 1 Glasses of wine per week  . Drug use: No  . Sexual activity: Yes    Partners: Male  Lifestyle  . Physical activity    Days per week: Not on file    Minutes per session: Not on file  . Stress: Not on file  Relationships  . Social Herbalist on phone: Not on file    Gets together: Not on file    Attends religious service: Not on file    Active member of club or organization: Not on file    Attends meetings of clubs or organizations: Not on file    Relationship status: Not on file  . Intimate partner violence    Fear of current or ex partner: Not on file    Emotionally abused: Not on file  Physically abused: Not on file    Forced sexual activity: Not on file  Other Topics Concern  . Not on file  Social History Narrative  . Not on file    Outpatient Medications Prior to Visit  Medication Sig Dispense Refill  . albuterol (PROVENTIL HFA;VENTOLIN HFA) 108 (90 Base) MCG/ACT inhaler Inhale 2 puffs into the lungs every 6 (six) hours as needed for wheezing or shortness of breath. 1 Inhaler 2  . benzonatate (TESSALON PERLES) 100 MG capsule Take 1 capsule (100 mg total) by mouth 3 (three) times daily as needed for cough. 20 capsule 0  . cyclobenzaprine (FLEXERIL) 10 MG tablet Take 1 tablet (10 mg total) by mouth at bedtime. 30 tablet 0  . Fluticasone-Salmeterol (ADVAIR) 250-50 MCG/DOSE AEPB Inhale 1 puff into the lungs daily. 60 each 1  . ipratropium-albuterol (DUONEB)  0.5-2.5 (3) MG/3ML SOLN Take 3 mLs by nebulization every 6 (six) hours as needed. 360 mL 1  . montelukast (SINGULAIR) 10 MG tablet Take 1 tablet (10 mg total) by mouth at bedtime. 30 tablet 1  . witch hazel-glycerin (TUCKS) pad Apply 1 application topically as needed for itching or hemorrhoids. 40 each 0  . predniSONE (DELTASONE) 5 MG tablet Take 4 tablets (20 mg total) by mouth daily with breakfast. 10 tablet 0   No facility-administered medications prior to visit.     No Known Allergies  ROS Review of Systems  Constitutional: Negative.   HENT: Negative.   Respiratory: Positive for cough (intermittent cough) and shortness of breath (intermittent sob with walking 2 blocks). Negative for wheezing.   Cardiovascular: Negative.   Musculoskeletal: Positive for arthralgias (acute right arm pain).  Skin: Negative.   Neurological: Negative.   Psychiatric/Behavioral: The patient is not nervous/anxious.       Objective:    Physical Exam No vital sign or PE done There were no vitals taken for this visit. Wt Readings from Last 3 Encounters:  01/24/18 245 lb 12.8 oz (111.5 kg)     Health Maintenance Due  Topic Date Due  . HIV Screening  11/14/1980  . TETANUS/TDAP  11/14/1984  . PAP SMEAR-Modifier  11/14/1986  . MAMMOGRAM  11/15/2015  . COLONOSCOPY  11/15/2015    There are no preventive care reminders to display for this patient.  Lab Results  Component Value Date   TSH 2.690 05/01/2019   Lab Results  Component Value Date   WBC 9.3 05/01/2019   HGB 11.5 05/01/2019   HCT 35.0 05/01/2019   MCV 87 05/01/2019   PLT 409 05/01/2019   Lab Results  Component Value Date   NA 140 05/01/2019   K 3.7 05/01/2019   CO2 22 05/01/2019   GLUCOSE 87 05/01/2019   BUN 20 05/01/2019   CREATININE 0.96 05/01/2019   BILITOT 0.2 05/01/2019   ALKPHOS 87 05/01/2019   AST 18 05/01/2019   ALT 13 05/01/2019   PROT 7.1 05/01/2019   ALBUMIN 4.6 05/01/2019   CALCIUM 9.4 05/01/2019   Lab  Results  Component Value Date   CHOL 176 05/01/2019   Lab Results  Component Value Date   HDL 51 05/01/2019   Lab Results  Component Value Date   LDLCALC 109 (H) 05/01/2019   Lab Results  Component Value Date   TRIG 78 05/01/2019   Lab Results  Component Value Date   CHOLHDL 3.5 05/01/2019   Lab Results  Component Value Date   HGBA1C 5.4 05/01/2019      Assessment & Plan:  1. Right arm pain - She was advised to use ice, and heat packs, take otc tylenol and notify clinic or go to the ED for worsening pain.  2. Hematuria, microscopic - She was advised to complete charity care application for  Ambulatory referral to Urology  3. Cough - Her cough is improving,she was advised to increase fluid intake and notify clinic for worsening cough.  4. Dyspnea on exertion - She will continue on current treatment regimen and notify the clinic or go to the ED for worsening symptoms.   Follow-up: Return in about 6 weeks (around 07/16/2019), or if symptoms worsen or fail to improve.    Umaiza Matusik Trellis PaganiniE Ivannia Willhelm, NP

## 2019-06-11 ENCOUNTER — Other Ambulatory Visit: Payer: Self-pay

## 2019-06-11 ENCOUNTER — Ambulatory Visit: Payer: Self-pay | Admitting: Licensed Clinical Social Worker

## 2019-06-11 DIAGNOSIS — Z789 Other specified health status: Secondary | ICD-10-CM

## 2019-06-11 NOTE — BH Specialist Note (Signed)
Clinician asked the patient what kinds of resources does she need assistance with. She explained that in order for her to get a pair of eye glasses through [employer] [facility], that they would need a valid eye glasses prescription to have on file and she could pick out her glasses to order them. She explained that in terms of eligibility not only for [facility] and [facility] through [facility]; they need a copy of her driver's license, a food stamp award letter, and unemployment letter. She explained that she may need something from [employer] in terms that she will be residing in a residence in the next week. She explained to the patient that she would need to speak with Carlyon Shadow, NP and Leda Min CMA in regards to if they can accommodate writing a letter for her.   Patient stated she needed assistance with eye glasses and charity care.

## 2019-07-01 ENCOUNTER — Emergency Department: Payer: Self-pay

## 2019-07-01 ENCOUNTER — Emergency Department
Admission: EM | Admit: 2019-07-01 | Discharge: 2019-07-01 | Disposition: A | Discharge status: Home / Self Care | Payer: Self-pay | Attending: Emergency Medicine | Admitting: Emergency Medicine

## 2019-07-01 ENCOUNTER — Other Ambulatory Visit: Payer: Self-pay

## 2019-07-01 DIAGNOSIS — R079 Chest pain, unspecified: Secondary | ICD-10-CM | POA: Insufficient documentation

## 2019-07-01 DIAGNOSIS — R0789 Other chest pain: Secondary | ICD-10-CM

## 2019-07-01 DIAGNOSIS — F17210 Nicotine dependence, cigarettes, uncomplicated: Secondary | ICD-10-CM | POA: Insufficient documentation

## 2019-07-01 DIAGNOSIS — J45909 Unspecified asthma, uncomplicated: Secondary | ICD-10-CM | POA: Insufficient documentation

## 2019-07-01 LAB — CBC
HCT: 35 % — ABNORMAL LOW (ref 36.0–46.0)
Hemoglobin: 11.3 g/dL — ABNORMAL LOW (ref 12.0–15.0)
MCH: 28.8 pg (ref 26.0–34.0)
MCHC: 32.3 g/dL (ref 30.0–36.0)
MCV: 89.1 fL (ref 80.0–100.0)
Platelets: 368 10*3/uL (ref 150–400)
RBC: 3.93 MIL/uL (ref 3.87–5.11)
RDW: 13.3 % (ref 11.5–15.5)
WBC: 6.6 10*3/uL (ref 4.0–10.5)
nRBC: 0 % (ref 0.0–0.2)

## 2019-07-01 LAB — FIBRIN DERIVATIVES D-DIMER (ARMC ONLY): Fibrin derivatives D-dimer (ARMC): 492.56 ng/mL (FEU) (ref 0.00–499.00)

## 2019-07-01 LAB — TROPONIN I (HIGH SENSITIVITY)
Troponin I (High Sensitivity): <2 ng/L (ref <18)
Troponin I (High Sensitivity): <2 ng/L (ref <18)

## 2019-07-01 LAB — BASIC METABOLIC PANEL
Anion gap: 8 (ref 5–15)
BUN: 15 mg/dL (ref 6–20)
CO2: 27 mmol/L (ref 22–32)
Calcium: 9.4 mg/dL (ref 8.9–10.3)
Chloride: 109 mmol/L (ref 98–111)
Creatinine, Ser: 0.77 mg/dL (ref 0.44–1.00)
GFR calc Af Amer: >60 mL/min (ref >60)
GFR calc non Af Amer: >60 mL/min (ref >60)
Glucose, Bld: 98 mg/dL (ref 70–99)
Potassium: 3.6 mmol/L (ref 3.5–5.1)
Sodium: 144 mmol/L (ref 135–145)

## 2019-07-01 MED ORDER — FAMOTIDINE 20 MG PO TABS
20.0000 mg | ORAL_TABLET | Freq: Once | ORAL | Status: AC
  Administered 2019-07-01: 20 mg via ORAL
  Filled 2019-07-01: qty 1

## 2019-07-01 MED ORDER — ASPIRIN 81 MG PO CHEW
324.0000 mg | CHEWABLE_TABLET | Freq: Once | ORAL | Status: AC
  Administered 2019-07-01: 324 mg via ORAL
  Filled 2019-07-01: qty 4

## 2019-07-01 NOTE — Discharge Instructions (Addendum)

## 2019-07-01 NOTE — ED Triage Notes (Signed)
Chest pain to center that radiates into bilateral ribs, intermittently "spasms" X 2 days. Pt alert and oriented X4, active, cooperative, pt in NAD. RR even and unlabored, color WNL.  Hx of costochondritis, but reports this feels different.

## 2019-07-01 NOTE — ED Provider Notes (Signed)
[facility] Emergency Department Provider Note   ____________________________________________   First MD Initiated Contact with Patient 07/01/19 1851     (approximate)  I have reviewed the triage vital signs and the nursing notes.   HISTORY  Chief Complaint Chest Pain    HPI Diana SheffieldDonna Michele' Antionette Galvan is a 53 y.o. female here for evaluation of chest discomfort.   Patient has noted since the weekend that she feels like she is having spasms over the left side of her lower chest.  They come and go.  Not associate with exercise.  Sometimes last seconds sometimes last minutes.  No shortness of breath.  No history of heart disease except for first-degree AV block that she describes.  No history of coronary disease.  Reports no early onset family history except for a cousin potentially who had heart disease early  She does smoke.  Denies abdominal pain.  No right side abdominal pain.  No nausea vomiting.  No sweating.  Pain seems to just come and go on its own.  She reports is not really "pain" rather just feels like a spasm not a heaviness or pressure and it does not radiate  No leg swelling.  No shortness of breath.  Does report a previous history of a blood clot in the left leg which he attributes to having had surgery.  She is had no other clots.  She reports it was years ago.  She denies any leg swelling or calf pain or tenderness.  Past Medical History:  Diagnosis Date   Asthma    Depression     Patient Active Problem List   Diagnosis Date Noted   Right arm pain 06/06/2019   Hematuria, microscopic 06/06/2019   Cough 06/06/2019   Dyspnea on exertion 06/06/2019   Segmental dysfunction of cervical region 01/24/2018   Muscle spasm of back 01/24/2018   Segmental dysfunction of lumbar region 01/24/2018   Segmental dysfunction of lower extremity 01/24/2018    Past Surgical History:  Procedure Laterality Date   THYROIDECTOMY     VAGINAL  HYSTERECTOMY  10/30/2002    Prior to Admission medications   Medication Sig Start Date End Date Taking? Authorizing Provider  albuterol (PROVENTIL HFA;VENTOLIN HFA) 108 (90 Base) MCG/ACT inhaler Inhale 2 puffs into the lungs every 6 (six) hours as needed for wheezing or shortness of breath. 04/02/19   Iloabachie, Chioma E, NP  benzonatate (TESSALON PERLES) 100 MG capsule Take 1 capsule (100 mg total) by mouth 3 (three) times daily as needed for cough. 05/09/19   Iloabachie, Chioma E, NP  cyclobenzaprine (FLEXERIL) 10 MG tablet Take 1 tablet (10 mg total) by mouth at bedtime. 03/28/19   Iloabachie, Chioma E, NP  Fluticasone-Salmeterol (ADVAIR) 250-50 MCG/DOSE AEPB Inhale 1 puff into the lungs daily. 05/09/19   Iloabachie, Chioma E, NP  ipratropium-albuterol (DUONEB) 0.5-2.5 (3) MG/3ML SOLN Take 3 mLs by nebulization every 6 (six) hours as needed. 04/02/19   Iloabachie, Chioma E, NP  montelukast (SINGULAIR) 10 MG tablet Take 1 tablet (10 mg total) by mouth at bedtime. 05/09/19   Iloabachie, Chioma E, NP  witch hazel-glycerin (TUCKS) pad Apply 1 application topically as needed for itching or hemorrhoids. 04/02/19   Iloabachie, Chioma E, NP    Allergies Patient has no known allergies.  Family History  Problem Relation Age of Onset   Mental illness Mother    Congestive Heart Failure Paternal Aunt     Social History Social History   Tobacco Use  Smoking status: Current Some Day Smoker    Packs/day: 0.20    Types: Cigarettes   Smokeless tobacco: Never Used  Substance Use Topics   Alcohol use: Yes    Alcohol/week: 1.0 standard drinks    Types: 1 Glasses of wine per week   Drug use: No    Review of Systems Constitutional: No fever/chills Eyes: No visual changes. ENT: No sore throat. Cardiovascular: Denies chest pain.  See HPI, she describes as "spasm" Respiratory: Denies shortness of breath. Gastrointestinal: No abdominal pain.   Genitourinary: Negative for dysuria. Musculoskeletal:  Negative for back pain. Skin: Negative for rash. Neurological: Negative for headaches, areas of focal weakness or numbness.    ____________________________________________   PHYSICAL EXAM:  VITAL SIGNS: ED Triage Vitals [07/01/19 1518]  Enc Vitals Group     BP 103/63     Pulse Rate 82     Resp 18     Temp 98.1 F (36.7 C)     Temp Source Oral     SpO2 99 %     Weight 230 lb (104.3 kg)     Height 5\' 7"  (1.702 m)     Head Circumference      Peak Flow      Pain Score 4     Pain Loc      Pain Edu?      Excl. in GC?     Constitutional: Alert and oriented. Well appearing and in no acute distress. Eyes: Conjunctivae are normal. Head: Atraumatic. Nose: No congestion/rhinnorhea. Mouth/Throat: Mucous membranes are moist. Neck: No stridor.  Cardiovascular: Normal rate, regular rhythm. Grossly normal heart sounds.  Good peripheral circulation. Respiratory: Normal respiratory effort.  No retractions. Lungs CTAB. Gastrointestinal: Soft and nontender. No distention. Musculoskeletal: No lower extremity tenderness nor edema. Neurologic:  Normal speech and language. No gross focal neurologic deficits are appreciated.  Skin:  Skin is warm, dry and intact. No rash noted. Psychiatric: Mood and affect are normal. Speech and behavior are normal.  ____________________________________________   LABS (all labs ordered are listed, but only abnormal results are displayed)  Labs Reviewed  CBC - Abnormal; Notable for the following components:      Result Value   Hemoglobin 11.3 (*)    HCT 35.0 (*)    All other components within normal limits  BASIC METABOLIC PANEL  TROPONIN I (HIGH SENSITIVITY)  FIBRIN DERIVATIVES D-DIMER ([facility] ONLY)  TROPONIN I (HIGH SENSITIVITY)  TROPONIN I (HIGH SENSITIVITY)  POC URINE PREG, ED   ____________________________________________  EKG  ED ECG REPORT I, Sharyn CreamerMark Aarushi Hemric, the attending physician, personally viewed and interpreted this ECG.  Date:  07/01/2019 EKG Time: 1520 Rate: 75 Rhythm: normal sinus rhythm QRS Axis: normal Intervals: normal ST/T Wave abnormalities: normal Narrative Interpretation: no evidence of acute ischemia except for first-degree AV block  ____________________________________________  RADIOLOGY  Dg Chest 2 View  Result Date: 07/01/2019 CLINICAL DATA:  RIGHT side chest pain and rib pain radiating to RIGHT arm, history of costochondritis EXAM: CHEST - 2 VIEW COMPARISON:  None FINDINGS: Normal heart size, mediastinal contours, and pulmonary vascularity. Mild bronchitic changes with subsegmental atelectasis at LEFT base. Remaining lungs clear. No pleural effusion or pneumothorax. Bones unremarkable. IMPRESSION: Mild bronchitic changes with subsegmental atelectasis at LEFT base. Electronically Signed   By: Ulyses SouthwardMark  Boles M.D.   On: 07/01/2019 17:30     ____________________________________________   PROCEDURES  Procedure(s) performed: None  Procedures  Critical Care performed: No  ____________________________________________   INITIAL IMPRESSION / ASSESSMENT AND  PLAN / ED COURSE  Pertinent labs & imaging results that were available during my care of the patient were reviewed by me and considered in my medical decision making (see chart for details).   Differential diagnosis includes, but is not limited to, ACS, aortic dissection, pulmonary embolism, cardiac tamponade, pneumothorax, pneumonia, pericarditis, myocarditis, GI-related causes including esophagitis/gastritis, and musculoskeletal chest wall pain.  Work-up thus far reassuring.  Atypical discomfort, no significant risk factors noted other than age and history of smoking.  Denies history of coronary disease.  Discussed with the patient and we discussed that I would like to observe her and also obtain added testing including a repeat heart test and a test to make sure she has no blood clot which if found would be a life-threatening cause and to assure  she is not at high risk for heart attack.  However, the patient reports she just left the shelter, and a man who supposed to deliver furniture to her house has arrived and she needs to go now.  She cannot stay, but she did give me her cell phone number and I will call her with results and she reports she will return right away if her symptoms come back or if any of her test results come back returning concerning she will come back to ER.  She understands the risk of leaving, she with shared medical decision making and unfortunately has to go at this point but I will send d-dimer and troponin and call her with the result.  Patient agreeable to scheduling a follow-up with cardiology, careful return precautions, and also I placed a referral to cardiology as well.  Overall my impression is unlikely to be a life-threatening etiology of chest pain, low pretest probability for PE with a low Wells score appropriate for d-dimer.  No signs or symptoms that dissection, pneumothorax, pneumonia, infectious etiology or acute coronary disease denoted   Called back patient with results D-Dimer < 500 Trop < 2 at 920p. Patient agreeable with plan to see cardiology and return precautions again stressed.   Return precautions and treatment recommendations and follow-up discussed with the patient who is agreeable with the plan.       ____________________________________________   FINAL CLINICAL IMPRESSION(S) / ED DIAGNOSES  Final diagnoses:  Chest pain, atypical  Chest pain with low risk for cardiac etiology        Note:  This document was prepared using Dragon voice recognition software and may include unintentional dictation errors       Delman Kitten, MD 07/01/19 2120

## 2019-07-11 ENCOUNTER — Other Ambulatory Visit: Payer: Self-pay

## 2019-07-11 ENCOUNTER — Ambulatory Visit: Payer: Self-pay | Admitting: Gerontology

## 2019-07-11 DIAGNOSIS — K219 Gastro-esophageal reflux disease without esophagitis: Secondary | ICD-10-CM

## 2019-07-11 DIAGNOSIS — Z8709 Personal history of other diseases of the respiratory system: Secondary | ICD-10-CM

## 2019-07-11 DIAGNOSIS — R0789 Other chest pain: Secondary | ICD-10-CM | POA: Insufficient documentation

## 2019-07-11 MED ORDER — OMEPRAZOLE 20 MG PO CPDR: 20.0000 mg | DELAYED_RELEASE_CAPSULE | Freq: Every day | ORAL | 0 refills | Status: DC

## 2019-07-11 NOTE — Progress Notes (Signed)
Established Patient Office Visit  Subjective:  Patient ID: Diana Galvan, female    DOB: 15-Apr-1966  Age: 53 y.o. MRN: [id number]  CC:  Chief Complaint  Patient presents with  . Follow-up    chest pains, spasms, painful    [facility] presents for follow up of chest pain. She was evaluated at the ED on 07/01/2019 for left chest pain. Her EKG had no evidence of acute ischemia except for first-degree AV block and a Cardiology referral was ordered. Chest x ray showed mild bronchitic changes with subsegmental atelectasis at left base. Currently, she continues to complain of intermittent non radiating sharp spasms to her left chest. She states that it occurs 3- 4 times during the day and resolves within 2-3 minutes. She states that chest spasm is associated with spiting up medium amount of saliva and acidic liquid. She reports no aggravating factor and states that taking Tums or Mylanta relieves symptoms briefly but it occurs again. She denies diaphoresis, light headedness and shortness of breath during episodes of chest spasms.  She has a history of Asthma and endorses experiencing mild intermittent shortness of breath wit exertion and uses albuterol inhaler as needed. She states that she experienced an episode of waking up from sleep gasping for air saying that "she can't breath", but it resolved immediately. She denies fever, chills and offers no further concerns.  Past Medical History:  Diagnosis Date  . Asthma   . Depression     Past Surgical History:  Procedure Laterality Date  . THYROIDECTOMY    . VAGINAL HYSTERECTOMY  10/30/2002    Family History  Problem Relation Age of Onset  . Mental illness Mother   . Congestive Heart Failure Paternal Aunt     Social History   Socioeconomic History  . Marital status: Divorced    Spouse name: Not on file  . Number of children: Not on file  . Years of education: Not on file  . Highest education level: Not on file   Occupational History  . Not on file  Social Needs  . Financial resource strain: Not on file  . Food insecurity    Worry: Not on file    Inability: Not on file  . Transportation needs    Medical: Not on file    Non-medical: Not on file  Tobacco Use  . Smoking status: Current Some Day Smoker    Packs/day: 0.20    Types: Cigarettes  . Smokeless tobacco: Never Used  Substance and Sexual Activity  . Alcohol use: Yes    Alcohol/week: 1.0 standard drinks    Types: 1 Glasses of wine per week  . Drug use: No  . Sexual activity: Yes    Partners: Male  Lifestyle  . Physical activity    Days per week: Not on file    Minutes per session: Not on file  . Stress: Not on file  Relationships  . Social Musicianconnections    Talks on phone: Not on file    Gets together: Not on file    Attends religious service: Not on file    Active member of club or organization: Not on file    Attends meetings of clubs or organizations: Not on file    Relationship status: Not on file  . Intimate partner violence    Fear of current or ex partner: Not on file    Emotionally abused: Not on file    Physically abused: Not on file  Forced sexual activity: Not on file  Other Topics Concern  . Not on file  Social History Narrative  . Not on file    Outpatient Medications Prior to Visit  Medication Sig Dispense Refill  . albuterol (PROVENTIL HFA;VENTOLIN HFA) 108 (90 Base) MCG/ACT inhaler Inhale 2 puffs into the lungs every 6 (six) hours as needed for wheezing or shortness of breath. 1 Inhaler 2  . cyclobenzaprine (FLEXERIL) 10 MG tablet Take 1 tablet (10 mg total) by mouth at bedtime. 30 tablet 0  . Fluticasone-Salmeterol (ADVAIR) 250-50 MCG/DOSE AEPB Inhale 1 puff into the lungs daily. 60 each 1  . ipratropium-albuterol (DUONEB) 0.5-2.5 (3) MG/3ML SOLN Take 3 mLs by nebulization every 6 (six) hours as needed. 360 mL 1  . montelukast (SINGULAIR) 10 MG tablet Take 1 tablet (10 mg total) by mouth at bedtime. 30  tablet 1  . witch hazel-glycerin (TUCKS) pad Apply 1 application topically as needed for itching or hemorrhoids. 40 each 0  . benzonatate (TESSALON PERLES) 100 MG capsule Take 1 capsule (100 mg total) by mouth 3 (three) times daily as needed for cough. 20 capsule 0   No facility-administered medications prior to visit.     No Known Allergies  ROS Review of Systems  Constitutional: Negative.  Negative for chills, diaphoresis and fever.  HENT: Negative.   Respiratory: Positive for shortness of breath (intermittent). Negative for cough, chest tightness and wheezing.   Cardiovascular: Positive for chest pain (intermittent spasm). Negative for leg swelling.  Gastrointestinal: Negative.   Skin: Negative.   Neurological: Negative.   Psychiatric/Behavioral: Negative.       Objective:    Physical Exam No vital sign or PE was done. There were no vitals taken for this visit. Wt Readings from Last 3 Encounters:  07/01/19 230 lb (104.3 kg)  01/24/18 245 lb 12.8 oz (111.5 kg)     Health Maintenance Due  Topic Date Due  . HIV Screening  11/14/1980  . TETANUS/TDAP  11/14/1984  . PAP SMEAR-Modifier  11/14/1986  . MAMMOGRAM  11/15/2015  . COLONOSCOPY  11/15/2015    There are no preventive care reminders to display for this patient.  Lab Results  Component Value Date   TSH 2.690 05/01/2019   Lab Results  Component Value Date   WBC 6.6 07/01/2019   HGB 11.3 (L) 07/01/2019   HCT 35.0 (L) 07/01/2019   MCV 89.1 07/01/2019   PLT 368 07/01/2019   Lab Results  Component Value Date   NA 144 07/01/2019   K 3.6 07/01/2019   CO2 27 07/01/2019   GLUCOSE 98 07/01/2019   BUN 15 07/01/2019   CREATININE 0.77 07/01/2019   BILITOT 0.2 05/01/2019   ALKPHOS 87 05/01/2019   AST 18 05/01/2019   ALT 13 05/01/2019   PROT 7.1 05/01/2019   ALBUMIN 4.6 05/01/2019   CALCIUM 9.4 07/01/2019   ANIONGAP 8 07/01/2019   Lab Results  Component Value Date   CHOL 176 05/01/2019   Lab Results   Component Value Date   HDL 51 05/01/2019   Lab Results  Component Value Date   LDLCALC 109 (H) 05/01/2019   Lab Results  Component Value Date   TRIG 78 05/01/2019   Lab Results  Component Value Date   CHOLHDL 3.5 05/01/2019   Lab Results  Component Value Date   HGBA1C 5.4 05/01/2019      Assessment & Plan:     1. Gastroesophageal reflux disease, esophagitis presence not specified - Ddx of possible  acid reflux, she will try omeprazole, was educated on side effect and advised to notify clinic. - omeprazole (PRILOSEC) 20 MG capsule; Take 1 capsule (20 mg total) by mouth daily.  Dispense: 30 capsule; Refill: 0  2. History of asthma - Possible sleep apnea, she was encouraged to complete charity care application for Seep study and Pulmonology referral due to mild bronchitic changes with subsegmental atelectasis at left base seen on chest x ray. - Ambulatory referral to Pulmonology - PSG Sleep Study; Future  3. Chest discomfort - Possible acid reflux, she will follow up with Cardiologist Dr Rogue Jury A on 08/30/2019. She was advised to go to the ED for worsening symptoms.   Follow-up: Return in about 5 days (around 07/16/2019), or if symptoms worsen or fail to improve.    Dareon Nunziato Jerold Coombe, NP

## 2019-07-16 ENCOUNTER — Ambulatory Visit: Payer: Self-pay | Admitting: Gerontology

## 2019-07-24 ENCOUNTER — Ambulatory Visit: Payer: Self-pay | Admitting: Urology

## 2019-08-06 ENCOUNTER — Ambulatory Visit: Payer: Self-pay | Admitting: Gerontology

## 2019-08-09 DIAGNOSIS — Z972 Presence of dental prosthetic device (complete) (partial): Secondary | ICD-10-CM

## 2019-08-09 DIAGNOSIS — A539 Syphilis, unspecified: Secondary | ICD-10-CM

## 2019-08-09 DIAGNOSIS — Z87898 Personal history of other specified conditions: Secondary | ICD-10-CM

## 2019-08-09 DIAGNOSIS — Z91419 Personal history of unspecified adult abuse: Secondary | ICD-10-CM

## 2019-08-09 DIAGNOSIS — F1991 Other psychoactive substance use, unspecified, in remission: Secondary | ICD-10-CM

## 2019-08-12 ENCOUNTER — Ambulatory Visit: Payer: Self-pay

## 2019-08-13 ENCOUNTER — Ambulatory Visit: Payer: Self-pay | Admitting: Gerontology

## 2019-08-15 ENCOUNTER — Other Ambulatory Visit: Payer: Self-pay

## 2019-08-15 ENCOUNTER — Telehealth: Payer: Self-pay | Admitting: Pharmacist

## 2019-08-15 ENCOUNTER — Ambulatory Visit: Payer: Self-pay | Admitting: Pharmacy Technician

## 2019-08-15 ENCOUNTER — Ambulatory Visit: Payer: Self-pay | Admitting: Gerontology

## 2019-08-15 VITALS — BP 111/75 | HR 72 | Temp 97.0°F | Wt 231.0 lb

## 2019-08-15 DIAGNOSIS — Z79899 Other long term (current) drug therapy: Secondary | ICD-10-CM

## 2019-08-15 DIAGNOSIS — Z0000 Encounter for general adult medical examination without abnormal findings: Secondary | ICD-10-CM

## 2019-08-15 DIAGNOSIS — R200 Anesthesia of skin: Secondary | ICD-10-CM

## 2019-08-15 DIAGNOSIS — G8929 Other chronic pain: Secondary | ICD-10-CM | POA: Insufficient documentation

## 2019-08-15 DIAGNOSIS — J45909 Unspecified asthma, uncomplicated: Secondary | ICD-10-CM

## 2019-08-15 MED ORDER — IPRATROPIUM-ALBUTEROL 0.5-2.5 (3) MG/3ML IN SOLN: 3.0000 mL | Freq: Four times a day (QID) | RESPIRATORY_TRACT | 1 refills | Status: DC | PRN

## 2019-08-15 NOTE — Progress Notes (Signed)
Established Patient Office Visit  Subjective:  Patient ID: Diana Galvan, female    DOB: Nov 05, 1966  Age: 53 y.o. MRN: [id number]  CC: No chief complaint on file.   [facility] [place] presents for c/o chronic intermittent right shoulder pain and intermittent numbness to right thumb, index and middle finger that has being going on for 2 years. She states that the numbness started when she resumed work 2 months ago. She works at a call center, typing constantly with a computer and she denies motor weakness. She has a history of asthma and reports that she continues to experience intermittent dyspnea with walking more than 5 blocks. She denies wheezing, shortness of breath and cough. She also states that she hasn't had mammogram, pap smear and colonoscopy. She is compliant with her medications, denies chest pain, palpitation, fever, chills and no further concerns.  Past Medical History:  Diagnosis Date  . Asthma   . Depression     Past Surgical History:  Procedure Laterality Date  . APPENDECTOMY    . THYROIDECTOMY    . TUBAL LIGATION    . VAGINAL HYSTERECTOMY  10/30/2002    Family History  Problem Relation Age of Onset  . Mental illness Mother   . Congestive Heart Failure Paternal Aunt     Social History   Socioeconomic History  . Marital status: Divorced    Spouse name: Not on file  . Number of children: Not on file  . Years of education: Not on file  . Highest education level: Not on file  Occupational History  . Not on file  Social Needs  . Financial resource strain: Not on file  . Food insecurity    Worry: Not on file    Inability: Not on file  . Transportation needs    Medical: Not on file    Non-medical: Not on file  Tobacco Use  . Smoking status: Current Some Day Smoker    Packs/day: 0.20    Types: Cigarettes  . Smokeless tobacco: Never Used  Substance and Sexual Activity  . Alcohol use: Yes    Alcohol/week: 1.0 standard drinks   Types: 1 Glasses of wine per week  . Drug use: No  . Sexual activity: Yes    Partners: Male  Lifestyle  . Physical activity    Days per week: Not on file    Minutes per session: Not on file  . Stress: Not on file  Relationships  . Social Musicianconnections    Talks on phone: Not on file    Gets together: Not on file    Attends religious service: Not on file    Active member of club or organization: Not on file    Attends meetings of clubs or organizations: Not on file    Relationship status: Not on file  . Intimate partner violence    Fear of current or ex partner: Not on file    Emotionally abused: Not on file    Physically abused: Not on file    Forced sexual activity: Not on file  Other Topics Concern  . Not on file  Social History Narrative  . Not on file    Outpatient Medications Prior to Visit  Medication Sig Dispense Refill  . albuterol (PROVENTIL HFA;VENTOLIN HFA) 108 (90 Base) MCG/ACT inhaler Inhale 2 puffs into the lungs every 6 (six) hours as needed for wheezing or shortness of breath. 1 Inhaler 2  . cyclobenzaprine (FLEXERIL) 10 MG tablet Take 1 tablet (  10 mg total) by mouth at bedtime. 30 tablet 0  . Fluticasone-Salmeterol (ADVAIR) 250-50 MCG/DOSE AEPB Inhale 1 puff into the lungs daily. 60 each 1  . montelukast (SINGULAIR) 10 MG tablet Take 1 tablet (10 mg total) by mouth at bedtime. 30 tablet 1  . omeprazole (PRILOSEC) 20 MG capsule Take 1 capsule (20 mg total) by mouth daily. 30 capsule 0  . witch hazel-glycerin (TUCKS) pad Apply 1 application topically as needed for itching or hemorrhoids. 40 each 0  . ipratropium-albuterol (DUONEB) 0.5-2.5 (3) MG/3ML SOLN Take 3 mLs by nebulization every 6 (six) hours as needed. 360 mL 1   No facility-administered medications prior to visit.     No Known Allergies  ROS Review of Systems  Constitutional: Negative.   HENT: Negative.   Respiratory: Negative.   Cardiovascular: Negative.   Musculoskeletal: Positive for  arthralgias (chronic right shoulder pain).  Skin: Negative.   Neurological: Positive for numbness (intermittent numbness to right thumb, index and middle fingers).  Psychiatric/Behavioral: Negative.       Objective:    Physical Exam  Constitutional: She is oriented to person, place, and time. She appears well-developed and well-nourished.  HENT:  Head: Normocephalic and atraumatic.  Eyes: Pupils are equal, round, and reactive to light. EOM are normal.  Neck: Normal range of motion.  Cardiovascular: Normal rate and regular rhythm.  Pulmonary/Chest: Effort normal and breath sounds normal.  Abdominal: Soft. Bowel sounds are normal.  Musculoskeletal: Normal range of motion.  Neurological: She is alert and oriented to person, place, and time. She has normal reflexes.  Skin: Skin is warm and dry.  Psychiatric: She has a normal mood and affect. Her behavior is normal. Judgment and thought content normal.    BP 111/75 (BP Location: Right Arm, Patient Position: Sitting, Cuff Size: Large)   Pulse 72   Temp (!) 97 F (36.1 C)   Wt 231 lb (104.8 kg)   BMI 36.18 kg/m  Wt Readings from Last 3 Encounters:  08/15/19 231 lb (104.8 kg)  07/01/19 230 lb (104.3 kg)  01/24/18 245 lb 12.8 oz (111.5 kg)   She was encouraged to continue on her weight loss regimen.  Health Maintenance Due  Topic Date Due  . TETANUS/TDAP  11/14/1984  . PAP SMEAR-Modifier  11/14/1986  . MAMMOGRAM  11/15/2015  . COLONOSCOPY  11/15/2015  . INFLUENZA VACCINE  07/27/2019    There are no preventive care reminders to display for this patient.  Lab Results  Component Value Date   TSH 2.690 05/01/2019   Lab Results  Component Value Date   WBC 6.6 07/01/2019   HGB 11.3 (L) 07/01/2019   HCT 35.0 (L) 07/01/2019   MCV 89.1 07/01/2019   PLT 368 07/01/2019   Lab Results  Component Value Date   NA 144 07/01/2019   K 3.6 07/01/2019   CO2 27 07/01/2019   GLUCOSE 98 07/01/2019   BUN 15 07/01/2019   CREATININE  0.77 07/01/2019   BILITOT 0.2 05/01/2019   ALKPHOS 87 05/01/2019   AST 18 05/01/2019   ALT 13 05/01/2019   PROT 7.1 05/01/2019   ALBUMIN 4.6 05/01/2019   CALCIUM 9.4 07/01/2019   ANIONGAP 8 07/01/2019   Lab Results  Component Value Date   CHOL 176 05/01/2019   Lab Results  Component Value Date   HDL 51 05/01/2019   Lab Results  Component Value Date   LDLCALC 109 (H) 05/01/2019   Lab Results  Component Value Date   TRIG  78 05/01/2019   Lab Results  Component Value Date   CHOLHDL 3.5 05/01/2019   Lab Results  Component Value Date   HGBA1C 5.4 05/01/2019      Assessment & Plan:    1. Asthma, unspecified asthma severity, unspecified whether complicated, unspecified whether persistent - She was advised to continue current treatment regimen and advised to complete charity care application for Pulmonology referral. - ipratropium-albuterol (DUONEB) 0.5-2.5 (3) MG/3ML SOLN; Take 3 mLs by nebulization every 6 (six) hours as needed.  Dispense: 360 mL; Refill: 1 - Ambulatory referral to Pulmonology  2. Chronic right shoulder pain - She will continue to take Flexeril 10 mg at bedtime and to notify clinic for worsening symptoms.  3. Health care maintenance  - MM Digital Screening; Future - Cytology - PAP( [place]) - Ambulatory referral to Gastroenterology   4. Numbness of fingers - She was advised to wear hand splints when at work and notify clinic for worsening symptoms.   Follow-up: Return in about 9 weeks (around 10/17/2019), or if symptoms worsen or fail to improve.    Teryn Gust Jerold Coombe, NP

## 2019-08-15 NOTE — Patient Instructions (Signed)
Shoulder Pain Many things can cause shoulder pain, including:  An injury.  Moving the shoulder in the same way again and again (overuse).  Joint pain (arthritis). Pain can come from:  Swelling and irritation (inflammation) of any part of the shoulder.  An injury to the shoulder joint.  An injury to: ? Tissues that connect muscle to bone (tendons). ? Tissues that connect bones to each other (ligaments). ? Bones. Follow these instructions at home: Watch for changes in your symptoms. Let your doctor know about them. Follow these instructions to help with your pain. If you have a sling:  Wear the sling as told by your doctor. Remove it only as told by your doctor.  Loosen the sling if your fingers: ? Tingle. ? Become numb. ? Turn cold and blue.  Keep the sling clean.  If the sling is not waterproof: ? Do not let it get wet. ? Take the sling off when you shower or bathe. Managing pain, stiffness, and swelling   If told, put ice on the painful area: ? Put ice in a plastic bag. ? Place a towel between your skin and the bag. ? Leave the ice on for 20 minutes, 2-3 times a day. Stop putting ice on if it does not help with the pain.  Squeeze a soft ball or a foam pad as much as possible. This prevents swelling in the shoulder. It also helps to strengthen the arm. General instructions  Take over-the-counter and prescription medicines only as told by your doctor.  Keep all follow-up visits as told by your doctor. This is important. Contact a doctor if:  Your pain gets worse.  Medicine does not help your pain.  You have new pain in your arm, hand, or fingers. Get help right away if:  Your arm, hand, or fingers: ? Tingle. ? Are numb. ? Are swollen. ? Are painful. ? Turn white or blue. Summary  Shoulder pain can be caused by many things. These include injury, moving the shoulder in the same away again and again, and joint pain.  Watch for changes in your symptoms.  Let your doctor know about them.  This condition may be treated with a sling, ice, and pain medicine.  Contact your doctor if the pain gets worse or you have new pain. Get help right away if your arm, hand, or fingers tingle or get numb, swollen, or painful.  Keep all follow-up visits as told by your doctor. This is important. This information is not intended to replace advice given to you by your health care provider. Make sure you discuss any questions you have with your health care provider. Document Released: 05/30/2008 Document Revised: 06/26/2018 Document Reviewed: 06/26/2018 Elsevier Patient Education  2020 Elsevier [employer].  

## 2019-08-15 NOTE — Telephone Encounter (Signed)
08/15/2019 9:41:36 AM - Note to Jennifer/[facility] on patient elig.  08/15/2019 Bailey Mech in our clinic today seeing patient's--this patient has appt. with them at 10:00. Leanna Battles a note that patient has not returned her Recertification packet to [place]. Delos Haring

## 2019-08-16 DIAGNOSIS — R200 Anesthesia of skin: Secondary | ICD-10-CM | POA: Insufficient documentation

## 2019-08-19 ENCOUNTER — Other Ambulatory Visit: Payer: Self-pay

## 2019-08-19 NOTE — Progress Notes (Signed)
Completed [facility] application and contract.  Patient agreed to all terms of the [facility] contract.    Patient approved to receive medication assistance at [facility] as long as eligibility criteria continues to be met.    Provided patient with community resource material based on her particular needs.    [facility]

## 2019-08-22 ENCOUNTER — Telehealth: Payer: Self-pay | Admitting: Pharmacist

## 2019-08-22 NOTE — Telephone Encounter (Signed)
08/22/2019 10:55:32 AM - Ventolin & Advair faxed to [facility]  1/61/0960 Faxed [employer] application for enrollment for Ventolin HFA 17mcg Inhale 2 puffs into the lungs every 6 hours as needed for wheezing or shortness of breath, & Advair 250/50 Inhale 1 puff into the lungs two times a day, rinse mouth and spit after each use.Delos Haring

## 2019-08-26 ENCOUNTER — Ambulatory Visit: Payer: Self-pay | Admitting: Physician Assistant

## 2019-08-26 ENCOUNTER — Encounter: Payer: Self-pay | Admitting: Physician Assistant

## 2019-08-26 ENCOUNTER — Telehealth: Payer: Self-pay | Admitting: Pharmacy Technician

## 2019-08-26 ENCOUNTER — Other Ambulatory Visit: Payer: Self-pay

## 2019-08-26 DIAGNOSIS — Z113 Encounter for screening for infections with a predominantly sexual mode of transmission: Secondary | ICD-10-CM

## 2019-08-26 LAB — WET PREP FOR TRICH, YEAST, CLUE
Trichomonas Exam: NEGATIVE
Yeast Exam: NEGATIVE
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
## 2019-08-27 ENCOUNTER — Telehealth: Payer: Self-pay

## 2019-08-27 NOTE — Telephone Encounter (Signed)
I spoke with Diana Galvan again about eligibility.  I told her we did not have her charity care application.  I told her that it looks similar to our application.  She looked through her paperwork and found the application that [place] sent her.  She never turned it in. She stated she would fill it out and have her supervisor fax it to me today.

## 2019-08-27 NOTE — Telephone Encounter (Signed)
I spoke with Diana Galvan regarding charity care.  She stated that she turned in her application to "doctor Benjamine Mola"  I explained that we still needed paystubs and bank statements for June, July and August. She said she had received unemployment while she was out of work.  I told her that she could turn in her unemployment benefits letter for verification.  She said it would show on her bank statements.  According to [facility] at [facility], Diana Galvan told her she did not have bank statements.  Diana Galvan dropped off copies of bank statements for May, June and July.  According to Benjamine Mola, the only thing that Diana Galvan gave her was her 2019 taxes.  We have a March 24 pay stub and a printout from [facility] that says "Unemployment [name]" which shows May benefits.  We do not have the application.  Diana Galvan has mailed her an application more than once.  I am mailing another one to [place].  I explained to her that it looks similar to our application and [facility]'s application and that maybe she was mistaken about turning in the one for charity care.  I also stressed to her that she must be on time for her appointments or we have to reschedule.  I told her that I was sorry that she was caught in traffic and understood it was not her fault, but we cannot get behind on appointments.

## 2019-08-30 ENCOUNTER — Ambulatory Visit: Payer: Self-pay | Admitting: Cardiovascular Disease

## 2019-09-04 ENCOUNTER — Encounter: Payer: Self-pay | Admitting: Urology

## 2019-09-04 ENCOUNTER — Ambulatory Visit: Payer: Self-pay | Admitting: Urology

## 2019-09-09 ENCOUNTER — Telehealth: Payer: Self-pay | Admitting: Family Medicine

## 2019-09-09 NOTE — Telephone Encounter (Signed)
TC with patient.  Verified ID via password. Discussed negative STD screen results with patient. Aileen Fass, RN

## 2019-09-09 NOTE — Telephone Encounter (Signed)
WANTS TR °

## 2019-09-11 ENCOUNTER — Ambulatory Visit: Payer: Self-pay | Attending: Oncology

## 2019-09-11 ENCOUNTER — Ambulatory Visit
Admission: RE | Admit: 2019-09-11 | Discharge: 2019-09-11 | Disposition: A | Discharge status: Home / Self Care | Payer: Self-pay | Source: Ambulatory Visit | Attending: Oncology | Admitting: Oncology

## 2019-09-11 ENCOUNTER — Other Ambulatory Visit: Payer: Self-pay

## 2019-09-11 VITALS — BP 124/84 | HR 70 | Temp 97.5°F | Ht 67.5 in | Wt 232.9 lb

## 2019-09-11 DIAGNOSIS — N63 Unspecified lump in unspecified breast: Secondary | ICD-10-CM

## 2019-09-11 NOTE — Progress Notes (Signed)
  Subjective:     Patient ID: Diana Galvan, female   DOB: 1966/04/01, 53 y.o.   MRN: [id number]  HPI   Review of Systems     Objective:   Physical Exam Chest:     Breasts: Breasts are asymmetrical.        Right: Mass present. No swelling, bleeding, inverted nipple, nipple discharge, skin change or tenderness.        Left: No swelling, bleeding, inverted nipple, mass, nipple discharge, skin change or tenderness.       Comments: Right breast larger than left; right axillary soft tissue mass       Assessment:     53 year old patient patient presents for [place] clinic visit.  Patient screened, and meets [facility] eligibility.  Patient does not have insurance, Medicare or Medicaid. Instructed patient on breast self awareness using teach back methodClinical breast exam reveals a right axillary soft mass .  Patient reports it has been there for more than 2 years.  Stats she is having upper body, and bilateral arm pain that started today.  Encouraged her to follow with Primary provider or Emergency department if pain as sever as she describes.  Rates a 9-10.  Non specific.    Plan:     Sent for bilateral diagnostic mammogram, and ultrasound.

## 2019-09-12 NOTE — Progress Notes (Signed)
[facility]   DOB: 03-06-1966, 53 y.o.   MRN: [id number] Letter mailed from [facility] to notify of normal mammogram results.  Patient to return in one year for annual screening.  Copy to [facility].

## 2019-09-13 ENCOUNTER — Encounter: Payer: Self-pay | Admitting: Internal Medicine

## 2019-09-23 ENCOUNTER — Other Ambulatory Visit: Payer: Self-pay

## 2019-09-23 ENCOUNTER — Ambulatory Visit (INDEPENDENT_AMBULATORY_CARE_PROVIDER_SITE_OTHER): Payer: Self-pay | Admitting: Cardiology

## 2019-09-23 ENCOUNTER — Encounter: Payer: Self-pay | Admitting: Cardiology

## 2019-09-23 VITALS — BP 108/80 | HR 66 | Ht 67.0 in | Wt 236.2 lb

## 2019-09-23 DIAGNOSIS — R072 Precordial pain: Secondary | ICD-10-CM

## 2019-09-23 DIAGNOSIS — R0609 Other forms of dyspnea: Secondary | ICD-10-CM

## 2019-09-23 NOTE — Progress Notes (Signed)
Cardiology Office Note:    Date:  09/23/2019   ID:  Diana SheffieldDonna [facility]heleThe [facility], DOB June 20, 1966, MRN [id number]  PCP:  Virl Axehaplin, Don C, MD  Cardiologist:  Debbe OdeaBrian Agbor-Etang, MD  Electrophysiologist:  None   Referring MD: Sharyn CreamerQuale, Mark, MD   Chief Complaint  Patient presents with  . other    F/u ED chest pain and shoulder arm/right arm pain/numbness finger. Meds reviewed verbally with pt.    History of Present Illness:    Diana Galvan is a 53 y.o. female with a hx of asthma, current smoker for over 30 years, who presents as a follow-up from the emergency room due to chest pain.  Patient had symptoms of CVA chest pain 3 months ago while she was at home.  She rates the pain as a 10 out of 10, located in the mid sternum, associated with shortness of breath.  Pain lasted all 3 days she states, she drove herself to the emergency room.  In the emergency room she was evaluated with blood work, and was told it was negative.  She was given aspirin.  She states having chest tightness upon going upstairs and sometimes walking in the grocery store.  She usually thinks the chest pain and or shortness of breath is due to asthma since she has a long history of asthma.  She uses her albuterol rescue inhaler at least once or twice every day.  She denies any history of heart attacks.  She states having occasional chest discomfort since the incident that took her to the ED, but was not as severe.  Past Medical History:  Diagnosis Date  . Asthma   . Costochondritis   . DDD (degenerative disc disease), lumbar   . Depression   . Thyroid disease   . Thyroid goiter     Past Surgical History:  Procedure Laterality Date  . APPENDECTOMY    . THYROIDECTOMY    . TUBAL LIGATION    . VAGINAL HYSTERECTOMY  10/30/2002    Current Medications: Current Meds  Medication Sig  . albuterol (PROVENTIL HFA;VENTOLIN HFA) 108 (90 Base) MCG/ACT inhaler Inhale 2 puffs into the lungs every 6 (six) hours as needed for  wheezing or shortness of breath.  . Fluticasone-Salmeterol (ADVAIR) 250-50 MCG/DOSE AEPB Inhale 1 puff into the lungs daily.  Marland Kitchen. ipratropium-albuterol (DUONEB) 0.5-2.5 (3) MG/3ML SOLN Take 3 mLs by nebulization every 6 (six) hours as needed.  . montelukast (SINGULAIR) 10 MG tablet Take 1 tablet (10 mg total) by mouth at bedtime.  Marland Kitchen. omeprazole (PRILOSEC) 20 MG capsule Take 1 capsule (20 mg total) by mouth daily.  Marland Kitchen. witch hazel-glycerin (TUCKS) pad Apply 1 application topically as needed for itching or hemorrhoids.     Allergies:   Adhesive [tape] and Wool alcohol [lanolin]   Social History   Socioeconomic History  . Marital status: Divorced    Spouse name: Not on file  . Number of children: Not on file  . Years of education: Not on file  . Highest education level: Not on file  Occupational History  . Not on file  Social Needs  . Financial resource strain: Not on file  . Food insecurity    Worry: Not on file    Inability: Not on file  . Transportation needs    Medical: Not on file    Non-medical: Not on file  Tobacco Use  . Smoking status: Current Some Day Smoker    Packs/day: 0.25    Years: 38.00  Pack years: 9.50    Types: Cigarettes  . Smokeless tobacco: Never Used  Substance and Sexual Activity  . Alcohol use: Yes    Alcohol/week: 1.0 standard drinks    Types: 1 Glasses of wine per week  . Drug use: No    Types: "Crack" cocaine    Comment: Recovered 2001  . Sexual activity: Yes    Partners: Male  Lifestyle  . Physical activity    Days per week: Not on file    Minutes per session: Not on file  . Stress: Not on file  Relationships  . Social Musician on phone: Not on file    Gets together: Not on file    Attends religious service: Not on file    Active member of club or organization: Not on file    Attends meetings of clubs or organizations: Not on file    Relationship status: Not on file  Other Topics Concern  . Not on file  Social History  Narrative  . Not on file     Family History: The patient's family history includes Breast cancer in her cousin; Congestive Heart Failure in her paternal aunt; Heart Problems in her mother; Heart failure in her paternal aunt and paternal grandmother; Mental illness in her mother.  ROS:   Please see the history of present illness.     All other systems reviewed and are negative.  EKGs/Labs/Other Studies Reviewed:    The following studies were reviewed today:   EKG:  EKG is  ordered today.  The ekg ordered today demonstrates normal sinus rhythm, first-degree AV block.  Recent Labs: 05/01/2019: ALT 13; TSH 2.690 07/01/2019: BUN 15; Creatinine, Ser 0.77; Hemoglobin 11.3; Platelets 368; Potassium 3.6; Sodium 144  Recent Lipid Panel    Component Value Date/Time   CHOL 176 05/01/2019 1049   TRIG 78 05/01/2019 1049   HDL 51 05/01/2019 1049   CHOLHDL 3.5 05/01/2019 1049   LDLCALC 109 (H) 05/01/2019 1049    Physical Exam:    VS:  BP 108/80 (BP Location: Right Arm, Patient Position: Sitting, Cuff Size: Large)   Pulse 66   Ht  (1.702 m)   Wt 236 lb 4 oz (107.2 kg)   SpO2 98%   BMI 37.00 kg/m     Wt Readings from Last 3 Encounters:  09/23/19 236 lb 4 oz (107.2 kg)  09/11/19 232 lb 14.4 oz (105.6 kg)  08/15/19 231 lb (104.8 kg)     GEN:  Well nourished, well developed in no acute distress, obese HEENT: Normal NECK: No JVD; No carotid bruits LYMPHATICS: No lymphadenopathy CARDIAC: RRR, no murmurs, rubs, gallops RESPIRATORY:  Clear to auscultation without rales, wheezing or rhonchi  ABDOMEN: Soft, non-tender, non-distended MUSCULOSKELETAL:  No edema; No deformity  SKIN: Warm and dry NEUROLOGIC:  Alert and oriented x 3 PSYCHIATRIC:  Normal affect   ASSESSMENT:   Patient with symptoms concerning for angina.  Her risk factor is mainly long history of smoking.  In light of her history of asthma, requiring daily rescue inhalers, will avoid Lexiscan stress testing. 1.  Precordial pain   2. Dyspnea on exertion    PLAN:    In order of problems listed above:  1. We will obtain coronary CTA. 2. We will get echocardiogram. 3. Smoking cessation advised.  Total encounter time more than 40 minutes  Greater than 50% was spent in counseling and coordination of care with the patient   Follow-up after above tests.  Medication Adjustments/Labs and Tests Ordered: Current medicines are reviewed at length with the patient today.  Concerns regarding medicines are outlined above.  Orders Placed This Encounter  Procedures  . CT CORONARY MORPH W/CTA COR W/SCORE W/CA W/CM &/OR WO/CM  . CT CORONARY FRACTIONAL FLOW RESERVE DATA PREP  . CT CORONARY FRACTIONAL FLOW RESERVE FLUID ANALYSIS  . Basic metabolic panel  . EKG 12-Lead  . ECHOCARDIOGRAM COMPLETE   No orders of the defined types were placed in this encounter.   Patient Instructions  Medication Instructions:  Your physician recommends that you continue on your current medications as directed. Please refer to the Current Medication list given to you today.  If you need a refill on your cardiac medications before your next appointment, please call your pharmacy.   Lab work: Bmet  1-2 days prior to your Cardiac CT.  Please have your labwork drawn at [facility] medical mall. You do not need an appointment. Their hour are Mon-Fri 7am-5:30pm If you have labs (blood work) drawn today and your tests are completely normal, you will receive your results only by: Marland Kitchen MyChart Message (if you have MyChart) OR . A paper copy in the mail If you have any lab test that is abnormal or we need to change your treatment, we will call you to review the results.  Testing/Procedures: Your physician has requested that you have an echocardiogram. Echocardiography is a painless test that uses sound waves to create images of your heart. It provides your doctor with information about the size and shape of your heart and how well your  heart's chambers and valves are working. This procedure takes approximately one hour. There are no restrictions for this procedure.  Your physician has requested that you have cardiac CT. Cardiac computed tomography (CT) is a painless test that uses an x-ray machine to take clear, detailed pictures of your heart. For further information please visit HugeFiesta.tn. Please follow instruction sheet as given. (You will contacted by a scheduler to schedule)    Follow-Up: At [facility], you and your health needs are our priority.  As part of our continuing mission to provide you with exceptional heart care, we have created designated Provider Care Teams.  These Care Teams include your primary Cardiologist (physician) and Advanced Practice Providers (APPs -  Physician Assistants and Nurse Practitioners) who all work together to provide you with the care you need, when you need it. You will need a follow up appointment in 6 weeks.  You may see Kate Sable, MD or one of the following Advanced Practice Providers on your designated Care Team:   Murray Hodgkins, NP Christell Faith, PA-C . Marrianne Mood, PA-C  Any Other Special Instructions Will Be Listed Below (If Applicable).  Your cardiac CT will be scheduled at one of the below locations:   [facility] [address] [place], [place] [address] (336) [place] [address] [place], [place] [address] [phone]  If scheduled at [facility], please arrive at the [facility] main entrance of [facility] 30-45 minutes prior to test start time. Proceed to the [facility] Radiology Department (first floor) to check-in and test prep.  If scheduled at [facility], please arrive 15 mins early for check-in and test prep.  Please follow these instructions carefully (unless otherwise directed):    On the Night Before  the Test: . Be sure to Drink plenty of water. Marland Kitchen  Do not consume any caffeinated/decaffeinated beverages or chocolate 12 hours prior to your test. . Do not take any antihistamines 12 hours prior to your test.  On the Day of the Test: . Drink plenty of water. Do not drink any water within one hour of the test. . Do not eat any food 4 hours prior to the test. . You may take your regular medications prior to the test.  . FEMALES- please wear underwire-free bra if available        After the Test: . Drink plenty of water. . After receiving IV contrast, you may experience a mild flushed feeling. This is normal. . On occasion, you may experience a mild rash up to 24 hours after the test. This is not dangerous. If this occurs, you can take Benadryl 25 mg and increase your fluid intake. . If you experience trouble breathing, this can be serious. If it is severe call 911 IMMEDIATELY. If it is mild, please call our office. . If you take any of these medications: Glipizide/Metformin, Avandament, Glucavance, please do not take 48 hours after completing test unless otherwise instructed.    Please contact the cardiac imaging nurse navigator should you have any questions/concerns Rockwell Alexandria, RN Navigator Cardiac Imaging [facility] Heart and Vascular Services [phone] Office  [phone] Cell        Signed, Debbe Odea, MD  09/23/2019 12:42 PM    [facility]

## 2019-09-23 NOTE — Patient Instructions (Addendum)
Medication Instructions:  Your physician recommends that you continue on your current medications as directed. Please refer to the Current Medication list given to you today.  If you need a refill on your cardiac medications before your next appointment, please call your pharmacy.   Lab work: Bmet  1-2 days prior to your Cardiac CT.  Please have your labwork drawn at [facility] medical mall. You do not need an appointment. Their hour are Mon-Fri 7am-5:30pm If you have labs (blood work) drawn today and your tests are completely normal, you will receive your results only by: Marland Kitchen MyChart Message (if you have MyChart) OR . A paper copy in the mail If you have any lab test that is abnormal or we need to change your treatment, we will call you to review the results.  Testing/Procedures: Your physician has requested that you have an echocardiogram. Echocardiography is a painless test that uses sound waves to create images of your heart. It provides your doctor with information about the size and shape of your heart and how well your heart's chambers and valves are working. This procedure takes approximately one hour. There are no restrictions for this procedure.  Your physician has requested that you have cardiac CT. Cardiac computed tomography (CT) is a painless test that uses an x-ray machine to take clear, detailed pictures of your heart. For further information please visit HugeFiesta[facility]tn. Please follow instruction sheet as given. (You will contacted by a scheduler to schedule)    Follow-Up: At [facility], you and your health needs are our priority.  As part of our continuing mission to provide you with exceptional heart care, we have created designated Provider Care Teams.  These Care Teams include your primary Cardiologist (physician) and Advanced Practice Providers (APPs -  Physician Assistants and Nurse Practitioners) who all work together to provide you with the care you need, when you need  it. You will need a follow up appointment in 6 weeks.  You may see Kate Sable, MD or one of the following Advanced Practice Providers on your designated Care Team:   Murray Hodgkins, NP Christell Faith, PA-C . Marrianne Mood, PA-C  Any Other Special Instructions Will Be Listed Below (If Applicable).  Your cardiac CT will be scheduled at one of the below locations:   [facility] [address] [place], [place] [address] [phone]) [place] [address] [place], [place] [address] [phone]  If scheduled at [facility], please arrive at the [facility] main entrance of [facility] 30-45 minutes prior to test start time. Proceed to the [facility] Radiology Department (first floor) to check-in and test prep.  If scheduled at [facility], please arrive 15 mins early for check-in and test prep.  Please follow these instructions carefully (unless otherwise directed):    On the Night Before the Test: . Be sure to Drink plenty of water. . Do not consume any caffeinated/decaffeinated beverages or chocolate 12 hours prior to your test. . Do not take any antihistamines 12 hours prior to your test.  On the Day of the Test: . Drink plenty of water. Do not drink any water within one hour of the test. . Do not eat any food 4 hours prior to the test. . You may take your regular medications prior to the test.  . FEMALES- please wear underwire-free bra if available        After the Test: .  Drink plenty of water. . After receiving IV contrast, you may experience a mild flushed feeling. This is normal. . On occasion, you may experience a mild rash up to 24 hours after the test. This is not dangerous. If this occurs, you can take Benadryl 25 mg and increase your fluid intake. . If you experience trouble breathing, this can be serious. If it is severe call 911 IMMEDIATELY.  If it is mild, please call our office. . If you take any of these medications: Glipizide/Metformin, Avandament, Glucavance, please do not take 48 hours after completing test unless otherwise instructed.    Please contact the cardiac imaging nurse navigator should you have any questions/concerns Rockwell Alexandria, RN Navigator Cardiac Imaging [facility] Heart and Vascular Services [phone] Office  [phone] Cell

## 2019-09-24 ENCOUNTER — Ambulatory Visit: Payer: Self-pay | Admitting: Gerontology

## 2019-09-24 VITALS — BP 121/80 | HR 66 | Temp 97.2°F | Wt 237.2 lb

## 2019-09-24 DIAGNOSIS — Z0000 Encounter for general adult medical examination without abnormal findings: Secondary | ICD-10-CM

## 2019-09-24 DIAGNOSIS — R200 Anesthesia of skin: Secondary | ICD-10-CM

## 2019-09-24 DIAGNOSIS — K219 Gastro-esophageal reflux disease without esophagitis: Secondary | ICD-10-CM

## 2019-09-24 DIAGNOSIS — G8929 Other chronic pain: Secondary | ICD-10-CM

## 2019-09-24 MED ORDER — MELOXICAM 7.5 MG PO TABS: 7.5000 mg | ORAL_TABLET | Freq: Every day | ORAL | 0 refills | Status: DC

## 2019-09-24 MED ORDER — OMEPRAZOLE 20 MG PO CPDR: 20.0000 mg | DELAYED_RELEASE_CAPSULE | Freq: Every day | ORAL | 2 refills | Status: DC

## 2019-09-24 NOTE — Progress Notes (Signed)
Established Patient Office Visit  Subjective:  Patient ID: Diana Galvan, female    DOB: 1966/02/26  Age: 53 y.o. MRN: [id number]  CC: No chief complaint on file.   HPI [facility] [place] presents for complaint of right shoulder pain and numbness to right thumb, index and middle fingers that has being going on for 2 months. She states that the pain is constant dull to sharp that increases in intensity from 4-7 when using her right hand. She also states that the pain radiates to her right upper arm She reports being told of having bone spur to right shoulder 3 years ago. She states that taking tylenol and cyclobenzaprine offered no relief. She also reports of intermittent paresthesia to 3 of her right fingers. She states that numbness could occur anytime of the day, but resolves within less than 30 seconds. She reports experiencing intermittent shortness of breath when walking more than 4 blocks, and using her albuterol inhaler relieves her symptoms. She denies chest pain, palpitation, and offers no further complaint.  Past Medical History:  Diagnosis Date  . Asthma   . Costochondritis   . DDD (degenerative disc disease), lumbar   . Depression   . Thyroid disease   . Thyroid goiter     Past Surgical History:  Procedure Laterality Date  . APPENDECTOMY    . THYROIDECTOMY    . TUBAL LIGATION    . VAGINAL HYSTERECTOMY  10/30/2002    Family History  Problem Relation Age of Onset  . Mental illness Mother   . Heart Problems Mother   . Congestive Heart Failure Paternal Aunt   . Heart failure Paternal Aunt   . Breast cancer Cousin   . Heart failure Paternal Grandmother     Social History   Socioeconomic History  . Marital status: Divorced    Spouse name: Not on file  . Number of children: Not on file  . Years of education: Not on file  . Highest education level: Not on file  Occupational History  . Not on file  Social Needs  . Financial resource strain: Not on  file  . Food insecurity    Worry: Not on file    Inability: Not on file  . Transportation needs    Medical: Not on file    Non-medical: Not on file  Tobacco Use  . Smoking status: Current Some Day Smoker    Packs/day: 0.25    Years: 38.00    Pack years: 9.50    Types: Cigarettes  . Smokeless tobacco: Never Used  Substance and Sexual Activity  . Alcohol use: Yes    Alcohol/week: 1.0 standard drinks    Types: 1 Glasses of wine per week  . Drug use: No    Types: "Crack" cocaine    Comment: Recovered 2001  . Sexual activity: Yes    Partners: Male  Lifestyle  . Physical activity    Days per week: Not on file    Minutes per session: Not on file  . Stress: Not on file  Relationships  . Social Musicianconnections    Talks on phone: Not on file    Gets together: Not on file    Attends religious service: Not on file    Active member of club or organization: Not on file    Attends meetings of clubs or organizations: Not on file    Relationship status: Not on file  . Intimate partner violence    Fear of current or ex  partner: Not on file    Emotionally abused: Not on file    Physically abused: Not on file    Forced sexual activity: Not on file  Other Topics Concern  . Not on file  Social History Narrative  . Not on file    Outpatient Medications Prior to Visit  Medication Sig Dispense Refill  . albuterol (PROVENTIL HFA;VENTOLIN HFA) 108 (90 Base) MCG/ACT inhaler Inhale 2 puffs into the lungs every 6 (six) hours as needed for wheezing or shortness of breath. 1 Inhaler 2  . ipratropium-albuterol (DUONEB) 0.5-2.5 (3) MG/3ML SOLN Take 3 mLs by nebulization every 6 (six) hours as needed. 360 mL 1  . omeprazole (PRILOSEC) 20 MG capsule Take 1 capsule (20 mg total) by mouth daily. 30 capsule 0  . aspirin EC 81 MG tablet Take 81 mg by mouth daily.    . Fluticasone-Salmeterol (ADVAIR) 250-50 MCG/DOSE AEPB Inhale 1 puff into the lungs daily. 60 each 1  . montelukast (SINGULAIR) 10 MG tablet  Take 1 tablet (10 mg total) by mouth at bedtime. 30 tablet 1  . witch hazel-glycerin (TUCKS) pad Apply 1 application topically as needed for itching or hemorrhoids. (Patient not taking: Reported on 09/24/2019) 40 each 0   No facility-administered medications prior to visit.     Allergies  Allergen Reactions  . Adhesive [Tape] Other (See Comments)    Blisters and burning  . Wool Alcohol [Lanolin] Itching    ROS Review of Systems  Constitutional: Negative.   Respiratory: Positive for shortness of breath (intermittent sob).   Cardiovascular: Negative.   Musculoskeletal: Positive for arthralgias.  Skin: Negative.   Neurological: Positive for numbness.  Psychiatric/Behavioral: Negative.       Objective:    Physical Exam  Constitutional: She is oriented to person, place, and time. She appears well-developed and well-nourished.  HENT:  Head: Normocephalic and atraumatic.  Cardiovascular: Normal rate and regular rhythm.  Pulmonary/Chest: Effort normal and breath sounds normal.  Musculoskeletal:        General: Tenderness (on palpation to right shoulder.) present.  Neurological: She is alert and oriented to person, place, and time. She has normal reflexes.  Skin: Skin is warm and dry.  Psychiatric: She has a normal mood and affect. Her behavior is normal. Judgment and thought content normal.    BP 121/80 (BP Location: Right Arm, Patient Position: Sitting, Cuff Size: Large)   Pulse 66   Temp (!) 97.2 F (36.2 C)   Wt 237 lb 3.2 oz (107.6 kg)   BMI 37.15 kg/m  Wt Readings from Last 3 Encounters:  09/24/19 237 lb 3.2 oz (107.6 kg)  09/23/19 236 lb 4 oz (107.2 kg)  09/11/19 232 lb 14.4 oz (105.6 kg)  She was encouraged to continue on weight loss regimen.  Health Maintenance Due  Topic Date Due  . TETANUS/TDAP  11/14/1984  . PAP SMEAR-Modifier  11/14/1986  . MAMMOGRAM  11/15/2015  . COLONOSCOPY  11/15/2015    There are no preventive care reminders to display for this  patient.  Lab Results  Component Value Date   TSH 2.690 05/01/2019   Lab Results  Component Value Date   WBC 6.6 07/01/2019   HGB 11.3 (L) 07/01/2019   HCT 35.0 (L) 07/01/2019   MCV 89.1 07/01/2019   PLT 368 07/01/2019   Lab Results  Component Value Date   NA 144 07/01/2019   K 3.6 07/01/2019   CO2 27 07/01/2019   GLUCOSE 98 07/01/2019   BUN 15  07/01/2019   CREATININE 0.77 07/01/2019   BILITOT 0.2 05/01/2019   ALKPHOS 87 05/01/2019   AST 18 05/01/2019   ALT 13 05/01/2019   PROT 7.1 05/01/2019   ALBUMIN 4.6 05/01/2019   CALCIUM 9.4 07/01/2019   ANIONGAP 8 07/01/2019   Lab Results  Component Value Date   CHOL 176 05/01/2019   Lab Results  Component Value Date   HDL 51 05/01/2019   Lab Results  Component Value Date   LDLCALC 109 (H) 05/01/2019   Lab Results  Component Value Date   TRIG 78 05/01/2019   Lab Results  Component Value Date   CHOLHDL 3.5 05/01/2019   Lab Results  Component Value Date   HGBA1C 5.4 05/01/2019      Assessment & Plan:    1. Gastroesophageal reflux disease, esophagitis presence not specified -Acid reflux is controlled, she will continue on current treatment regimen. -Avoid spicy, fatty and fried food -Avoid sodas and sour juices -Avoid heavy meals -Avoid eating 4 hours before bedtime -Elevate head of bed at night - omeprazole (PRILOSEC) 20 MG capsule; Take 1 capsule (20 mg total) by mouth daily.  Dispense: 30 capsule; Refill: 2  2. Chronic right shoulder pain - She will continue on Mobic and will follow up with - Ambulatory referral to Orthopedic Surgery Dr Justice Rocher - meloxicam (MOBIC) 7.5 MG tablet; Take 1 tablet (7.5 mg total) by mouth daily.  Dispense: 40 tablet; Refill: 0  3. Numbness of fingers - She was advised to wear hand brace while at work, negative Tinel and Phalen's test. She was advised to notify clinic for worsening symptoms.  4. Health care maintenance  - Flu Vaccine QUAD 6+ mos PF IM (Fluarix Quad PF) was  administered.    Follow-up: Return in about 7 weeks (around 11/12/2019), or if symptoms worsen or fail to improve.    Diana Lynn Trellis Paganini, NP

## 2019-09-26 ENCOUNTER — Other Ambulatory Visit
Admission: RE | Admit: 2019-09-26 | Discharge: 2019-09-26 | Disposition: A | Discharge status: Home / Self Care | Payer: Self-pay | Source: Ambulatory Visit | Attending: Cardiology | Admitting: Cardiology

## 2019-09-26 DIAGNOSIS — R072 Precordial pain: Secondary | ICD-10-CM | POA: Insufficient documentation

## 2019-09-26 LAB — BASIC METABOLIC PANEL
Anion gap: 9 (ref 5–15)
BUN: 11 mg/dL (ref 6–20)
CO2: 27 mmol/L (ref 22–32)
Calcium: 9.5 mg/dL (ref 8.9–10.3)
Chloride: 105 mmol/L (ref 98–111)
Creatinine, Ser: 0.68 mg/dL (ref 0.44–1.00)
GFR calc Af Amer: >60 mL/min (ref >60)
GFR calc non Af Amer: >60 mL/min (ref >60)
Glucose, Bld: 92 mg/dL (ref 70–99)
Potassium: 4.1 mmol/L (ref 3.5–5.1)
Sodium: 141 mmol/L (ref 135–145)

## 2019-09-30 ENCOUNTER — Telehealth (HOSPITAL_COMMUNITY): Payer: Self-pay | Admitting: Emergency Medicine

## 2019-09-30 NOTE — Telephone Encounter (Signed)
Left message on voicemail with name and callback number Lailyn Appelbaum RN Navigator Cardiac Imaging [place] [facility] [phone] Office [phone] Cell  

## 2019-10-02 ENCOUNTER — Other Ambulatory Visit: Payer: Self-pay

## 2019-10-02 ENCOUNTER — Ambulatory Visit
Admission: RE | Admit: 2019-10-02 | Discharge: 2019-10-02 | Disposition: A | Discharge status: Home / Self Care | Payer: Self-pay | Source: Ambulatory Visit | Attending: Cardiology | Admitting: Cardiology

## 2019-10-02 DIAGNOSIS — R072 Precordial pain: Secondary | ICD-10-CM | POA: Insufficient documentation

## 2019-10-02 MED ORDER — NITROGLYCERIN 0.4 MG SL SUBL
0.8000 mg | SUBLINGUAL_TABLET | Freq: Once | SUBLINGUAL | Status: AC
  Administered 2019-10-02: 0.8 mg via SUBLINGUAL

## 2019-10-02 MED ORDER — IOHEXOL 350 MG/ML SOLN
75.0000 mL | Freq: Once | INTRAVENOUS | Status: AC | PRN
  Administered 2019-10-02: 11:00:00 75 mL via INTRAVENOUS

## 2019-10-03 ENCOUNTER — Telehealth: Payer: Self-pay

## 2019-10-03 NOTE — Telephone Encounter (Signed)
Returned the patient's call. Previous message was left on the patient's voicemail with her Cardiac CT results. Patient is to cal back if there are questions or concerns regarding the results given.

## 2019-10-03 NOTE — Telephone Encounter (Signed)
Patient returning call to go over results.  

## 2019-10-03 NOTE — Telephone Encounter (Signed)
DPR on file with ok to lmom. lmom with CTA results. Patient is to contact the office if any questions or concerns.

## 2019-10-03 NOTE — Telephone Encounter (Signed)
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

## 2019-10-03 NOTE — Telephone Encounter (Signed)
Patient returning call .  Will check vm and call if she has questions

## 2019-10-07 ENCOUNTER — Other Ambulatory Visit: Payer: Self-pay

## 2019-10-07 ENCOUNTER — Ambulatory Visit (INDEPENDENT_AMBULATORY_CARE_PROVIDER_SITE_OTHER): Payer: Self-pay | Admitting: Urology

## 2019-10-07 ENCOUNTER — Encounter: Payer: Self-pay | Admitting: Urology

## 2019-10-07 VITALS — BP 142/80 | Ht 67.0 in | Wt 232.0 lb

## 2019-10-07 DIAGNOSIS — N3281 Overactive bladder: Secondary | ICD-10-CM

## 2019-10-07 DIAGNOSIS — R3129 Other microscopic hematuria: Secondary | ICD-10-CM

## 2019-10-07 LAB — URINALYSIS, COMPLETE
Bilirubin, UA: NEGATIVE
Glucose, UA: NEGATIVE
Ketones, UA: NEGATIVE
Leukocytes,UA: NEGATIVE
Nitrite, UA: NEGATIVE
Protein,UA: NEGATIVE
Specific Gravity, UA: 1.025 (ref 1.005–1.030)
Urobilinogen, Ur: 0.2 mg/dL (ref 0.2–1.0)
pH, UA: 7 (ref 5.0–7.5)

## 2019-10-07 LAB — MICROSCOPIC EXAMINATION: Epithelial Cells (non renal): >10 /hpf — AB (ref 0–10)

## 2019-10-07 NOTE — Progress Notes (Signed)
10/07/19 3:03 PM   Martie Muhlbauer' [facility]ard [birth year] [id number]  Referring provider: Tawni Millers, MD [place],  [place] [address]  CC: Microscopic hematuria  HPI: I saw Ms. Carvalho in urology clinic in consultation for asymptomatic microscopic hematuria from Dr. Mable Fill.  She is a 53 year old female with a lifelong history of urinary symptoms of urgency, frequency, and occasional urge incontinence who was found to have 3 episodes of microscopic hematuria in January, May, and June this year.  On microscopic analysis there were "2+ RBCs."  She denies any gross hematuria or history of UTIs.  She denies any flank pain or history of nephrolithiasis.  She has a minimal smoking history and may smoke 1 cigarette a day when she is stressed out.  She denies any carcinogenic exposures.  She drinks water and juice during the day.  She does not have any bothersome urinary symptoms overnight.  Her past medical history is notable for asthma and occasional constipation.  There are no aggravating or alleviating factors.  Severity is mild.  She does not want to try any medications for her overactive bladder, and prefers a holistic approach.   PMH: Past Medical History:  Diagnosis Date  . Asthma   . Costochondritis   . DDD (degenerative disc disease), lumbar   . Depression   . Thyroid disease   . Thyroid goiter     Surgical History: Past Surgical History:  Procedure Laterality Date  . APPENDECTOMY    . THYROIDECTOMY    . TUBAL LIGATION    . VAGINAL HYSTERECTOMY  10/30/2002   Allergies:  Allergies  Allergen Reactions  . Adhesive [Tape] Other (See Comments)    Blisters and burning  . Wool Alcohol [Lanolin] Itching    Family History: Family History  Problem Relation Age of Onset  . Mental illness Mother   . Heart Problems Mother   . Congestive Heart Failure Paternal Aunt   . Heart failure Paternal Aunt   . Breast cancer Cousin   . Heart failure Paternal  Grandmother     Social History:  reports that she has been smoking cigarettes. She has a 9.50 pack-year smoking history. She has never used smokeless tobacco. She reports current alcohol use of about 1.0 standard drinks of alcohol per week. She reports that she does not use drugs.  ROS: Please see flowsheet from today's date for complete review of systems.  Physical Exam: BP (!) 142/80   Ht 5\' 7"  (1.702 m)   Wt 232 lb (105.2 kg)   BMI 36.34 kg/m    Constitutional:  Alert and oriented, No acute distress. Cardiovascular: No clubbing, cyanosis, or edema. Respiratory: Normal respiratory effort, no increased work of breathing. GI: Abdomen is soft, nontender, nondistended, no abdominal masses Lymph: No cervical or inguinal lymphadenopathy. Skin: No rashes, bruises or suspicious lesions. Neurologic: Grossly intact, no focal deficits, moving all 4 extremities. Psychiatric: Normal mood and affect.  Laboratory Data: Urinalysis today 0-5 WBCs, 0-2 RBCs, greater than 10 epithelial cells, moderate bacteria, nitrite negative  Pertinent Imaging: None to review  Assessment & Plan:   In summary, the patient is a 53 year old female with a lifelong history of urinary symptoms and what sounds like pelvic floor dysfunction with urinary urgency, frequency, and urge incontinence.  She has 3 episodes of reported microscopic hematuria from outside urinalyses, however urinalysis is benign appearing today.  She would like to proceed with further work-up.  We discussed common possible etiologies of hematuria including idiopathic,  malignancy, urolithiasis, and medical renal disease.  We reviewed the new AUA guidelines regarding micro hematuria evaluation, and the low, intermediate, and high risk categories.    Renal ultrasound and RTC for cystoscopy Referral placed to pelvic floor physical therapy  Legrand Rams, MD 10/07/2019  [facility] [address], [address]  [place], [place] [address] [phone]

## 2019-10-07 NOTE — Patient Instructions (Signed)

## 2019-10-09 ENCOUNTER — Telehealth: Payer: Self-pay

## 2019-10-09 NOTE — Telephone Encounter (Signed)
Called Ms. Veltre to discuss her shoulder pain and Mobic is not helping.  Patient states she is having severe pain in her shoulder, neck and it radiates down her arm into her fingers.  She has requested a different medication.  Jetty Peeks, NP will call her to discuss other medication options.

## 2019-10-10 ENCOUNTER — Other Ambulatory Visit: Payer: Self-pay | Admitting: Gerontology

## 2019-10-10 DIAGNOSIS — M25511 Pain in right shoulder: Secondary | ICD-10-CM

## 2019-10-10 DIAGNOSIS — G8929 Other chronic pain: Secondary | ICD-10-CM

## 2019-10-10 MED ORDER — IBUPROFEN 800 MG PO TABS: 800.0000 mg | ORAL_TABLET | Freq: Two times a day (BID) | ORAL | 0 refills | Status: DC

## 2019-10-21 ENCOUNTER — Other Ambulatory Visit: Payer: Self-pay

## 2019-10-21 ENCOUNTER — Ambulatory Visit
Admission: RE | Admit: 2019-10-21 | Discharge: 2019-10-21 | Disposition: A | Discharge status: Home / Self Care | Payer: Self-pay | Source: Ambulatory Visit | Attending: Urology | Admitting: Urology

## 2019-10-21 DIAGNOSIS — R3129 Other microscopic hematuria: Secondary | ICD-10-CM | POA: Insufficient documentation

## 2019-10-24 ENCOUNTER — Other Ambulatory Visit: Payer: Self-pay | Admitting: Urology

## 2019-10-29 ENCOUNTER — Ambulatory Visit: Payer: Self-pay | Admitting: Specialist

## 2019-10-31 ENCOUNTER — Ambulatory Visit (INDEPENDENT_AMBULATORY_CARE_PROVIDER_SITE_OTHER): Payer: Self-pay

## 2019-10-31 ENCOUNTER — Other Ambulatory Visit: Payer: Self-pay

## 2019-10-31 DIAGNOSIS — R072 Precordial pain: Secondary | ICD-10-CM

## 2019-11-04 ENCOUNTER — Encounter: Payer: Self-pay | Admitting: Cardiology

## 2019-11-04 ENCOUNTER — Other Ambulatory Visit: Payer: Self-pay

## 2019-11-04 ENCOUNTER — Ambulatory Visit (INDEPENDENT_AMBULATORY_CARE_PROVIDER_SITE_OTHER): Payer: Self-pay | Admitting: Cardiology

## 2019-11-04 VITALS — BP 120/80 | HR 72 | Ht 67.0 in | Wt 237.2 lb

## 2019-11-04 DIAGNOSIS — I2510 Atherosclerotic heart disease of native coronary artery without angina pectoris: Secondary | ICD-10-CM

## 2019-11-04 DIAGNOSIS — F17200 Nicotine dependence, unspecified, uncomplicated: Secondary | ICD-10-CM

## 2019-11-04 MED ORDER — ASPIRIN EC 81 MG PO TBEC: 81.0000 mg | DELAYED_RELEASE_TABLET | Freq: Every day | ORAL | 2 refills | Status: DC

## 2019-11-04 MED ORDER — ATORVASTATIN CALCIUM 40 MG PO TABS: 40.0000 mg | ORAL_TABLET | Freq: Every day | ORAL | 3 refills | Status: DC

## 2019-11-04 NOTE — Progress Notes (Signed)
Cardiology Office Note:    Date:  11/04/2019   [facility] DOB [birth year], MRN [id number]  PCP:  Virl Axe, MD  Cardiologist:  Debbe Odea, MD  Electrophysiologist:  None   Referring MD: Virl Axe, MD   Chief Complaint  Patient presents with  . office visit    6 week F/U-precordial pain and DOE; Meds verbally reviewed with patient.    History of Present Illness:    Diana Galvan is a 53 y.o. female with a hx of asthma, current smoker for over 30 years, who presents for follow-up.  She was originally seen as a follow-up from the emergency room due to chest pain.    At the time, patient had symptoms of chest pain while she was at home.  She rated the pain as a 10 out of 10, located in the mid sternum, associated with shortness of breath.  Pain lasted  3 days, she drove herself to the emergency room.  In the emergency room she was evaluated with blood work, and was told it was negative.  She was given aspirin.  She states having chest tightness upon going upstairs and sometimes walking in the grocery store.  She usually thinks the chest pain and or shortness of breath is due to asthma since she has a long history of asthma.  She uses her albuterol rescue inhaler at least once or twice every day.  She denies any history of heart attacks.  She states having occasional chest discomfort since the incident that took her to the ED, but was not as severe.  After last visit, a coronary CTA and echocardiogram was ordered.  Past Medical History:  Diagnosis Date  . Asthma   . Coronary artery disease    mild cad in mLAD on coronary CTA  . Costochondritis   . DDD (degenerative disc disease), lumbar   . Depression   . Thyroid disease   . Thyroid goiter     Past Surgical History:  Procedure Laterality Date  . APPENDECTOMY    . THYROIDECTOMY    . TUBAL LIGATION    . VAGINAL HYSTERECTOMY  10/30/2002    Current Medications: Current Meds  Medication  Sig  . albuterol (PROVENTIL HFA;VENTOLIN HFA) 108 (90 Base) MCG/ACT inhaler Inhale 2 puffs into the lungs every 6 (six) hours as needed for wheezing or shortness of breath.  . Fluticasone-Salmeterol (ADVAIR) 250-50 MCG/DOSE AEPB Inhale 1 puff into the lungs daily.  Marland Kitchen ibuprofen (ADVIL) 800 MG tablet Take 1 tablet (800 mg total) by mouth 2 (two) times daily.  Marland Kitchen ipratropium-albuterol (DUONEB) 0.5-2.5 (3) MG/3ML SOLN Take 3 mLs by nebulization every 6 (six) hours as needed.  . montelukast (SINGULAIR) 10 MG tablet Take 1 tablet (10 mg total) by mouth at bedtime.  . NON FORMULARY Sea moss 2 tbs daily  . omeprazole (PRILOSEC) 20 MG capsule Take 1 capsule (20 mg total) by mouth daily. (Patient taking differently: Take 20 mg by mouth daily as needed. )     Allergies:   Adhesive [tape] and Wool alcohol [lanolin]   Social History   Socioeconomic History  . Marital status: Divorced    Spouse name: Not on file  . Number of children: Not on file  . Years of education: Not on file  . Highest education level: Not on file  Occupational History  . Not on file  Social Needs  . Financial resource strain: Not on file  . Food insecurity  Worry: Not on file    Inability: Not on file  . Transportation needs    Medical: Not on file    Non-medical: Not on file  Tobacco Use  . Smoking status: Current Some Day Smoker    Packs/day: 0.25    Years: 38.00    Pack years: 9.50    Types: Cigarettes  . Smokeless tobacco: Never Used  Substance and Sexual Activity  . Alcohol use: Yes    Alcohol/week: 1.0 standard drinks    Types: 1 Glasses of wine per week  . Drug use: No    Types: "Crack" cocaine    Comment: Recovered 2001  . Sexual activity: Yes    Partners: Male  Lifestyle  . Physical activity    Days per week: Not on file    Minutes per session: Not on file  . Stress: Not on file  Relationships  . Social Herbalist on phone: Not on file    Gets together: Not on file    Attends  religious service: Not on file    Active member of club or organization: Not on file    Attends meetings of clubs or organizations: Not on file    Relationship status: Not on file  Other Topics Concern  . Not on file  Social History Narrative  . Not on file     Family History: The patient's family history includes Breast cancer in her cousin; Congestive Heart Failure in her paternal aunt; Heart Problems in her mother; Heart failure in her paternal aunt and paternal grandmother; Mental illness in her mother.  ROS:   Please see the history of present illness.     All other systems reviewed and are negative.  EKGs/Labs/Other Studies Reviewed:    The following studies were reviewed today:  TTE 10/31/2019 1. Left ventricular ejection fraction, by visual estimation, is 55 to 60%. The left ventricle has normal function. There is no left ventricular hypertrophy.  2. Global right ventricle has normal systolic function.The right ventricular size is normal. No increase in right ventricular wall thickness.  3. Left atrial size was normal.  4. TR signal is inadequate for assessing pulmonary artery systolic pressure.  Coronary CTA date 10/02/2019 IMPRESSION: 1. Coronary calcium score of 0.  2. Normal coronary origin with left dominance.  3. Mild (25-49%) mLAD non-calcified stenosis vs artifact as described above.  4. Mid LAD myocardial bridge (normal variant).  5. No evidence of obstructive CAD.  RECOMMENDATIONS: 1. Mild non-obstructive CAD (25-49%). Consider non-atherosclerotic causes of chest pain. Consider preventive therapy and risk factor modification.  EKG:  EKG is  ordered today.  The ekg ordered today demonstrates normal sinus rhythm, first-degree AV block.  Recent Labs: 05/01/2019: ALT 13; TSH 2.690 07/01/2019: Hemoglobin 11.3; Platelets 368 09/26/2019: BUN 11; Creatinine, Ser 0.68; Potassium 4.1; Sodium 141  Recent Lipid Panel    Component Value Date/Time   CHOL 176  05/01/2019 1049   TRIG 78 05/01/2019 1049   HDL 51 05/01/2019 1049   CHOLHDL 3.5 05/01/2019 1049   LDLCALC 109 (H) 05/01/2019 1049    Physical Exam:    VS:  BP 120/80 (BP Location: Left Arm, Patient Position: Sitting, Cuff Size: Normal)   Pulse 72   Ht 5\' 7"  (1.702 m)   Wt 237 lb 4 oz (107.6 kg)   SpO2 97%   BMI 37.16 kg/m     Wt Readings from Last 3 Encounters:  11/04/19 237 lb 4 oz (107.6 kg)  10/07/19 232 lb (105.2 kg)  09/24/19 237 lb 3.2 oz (107.6 kg)     GEN:  Well nourished, well developed in no acute distress, obese HEENT: Normal NECK: No JVD; No carotid bruits LYMPHATICS: No lymphadenopathy CARDIAC: RRR, no murmurs, rubs, gallops RESPIRATORY:  Clear to auscultation without rales, wheezing or rhonchi  ABDOMEN: Soft, non-tender, non-distended MUSCULOSKELETAL:  No edema; No deformity  SKIN: Warm and dry NEUROLOGIC:  Alert and oriented x 3 PSYCHIATRIC:  Normal affect   ASSESSMENT:   The patient's echocardiogram showed normal ejection fraction, normal diastolic function.  Coronary CTA showed mild (25-49%) noncalcified plaque in the mid LAD.  Chest pain not likely of cardiac origin. 1. Coronary artery disease involving native coronary artery of native heart without angina pectoris   2. Smoking    PLAN:    In order of problems listed above:  1. Recommend aspirin 81 mg daily, Lipitor 40 mg daily.  Low-cholesterol diet advised. 2. Smoking cessation advised.  Total encounter time more than 40 minutes  Greater than 50% was spent in counseling and coordination of care with the patient   Follow-up  in about a year   Medication Adjustments/Labs and Tests Ordered: Current medicines are reviewed at length with the patient today.  Concerns regarding medicines are outlined above.  Orders Placed This Encounter  Procedures  . EKG 12-Lead   Meds ordered this encounter  Medications  . aspirin EC 81 MG tablet    Sig: Take 1 tablet (81 mg total) by mouth daily.     Dispense:  90 tablet    Refill:  2  . atorvastatin (LIPITOR) 40 MG tablet    Sig: Take 1 tablet (40 mg total) by mouth daily.    Dispense:  90 tablet    Refill:  3    There are no Patient Instructions on file for this visit.   Signed, Debbe OdeaBrian Agbor-Etang, MD  11/04/2019 10:46 AM    [facility]

## 2019-11-04 NOTE — Patient Instructions (Signed)
Medication Instructions:  Your physician has recommended you make the following change in your medication:  1- Aspirin 81 mg by mouth once a day. 2- START Lipitor 40 mg (1 tablet) by mouth once a day.  *If you need a refill on your cardiac medications before your next appointment, please call your pharmacy*  Lab Work: none If you have labs (blood work) drawn today and your tests are completely normal, you will receive your results only by: Marland Kitchen MyChart Message (if you have MyChart) OR . A paper copy in the mail If you have any lab test that is abnormal or we need to change your treatment, we will call you to review the results.  Testing/Procedures: none  Follow-Up: At [facility], you and your health needs are our priority.  As part of our continuing mission to provide you with exceptional heart care, we have created designated Provider Care Teams.  These Care Teams include your primary Cardiologist (physician) and Advanced Practice Providers (APPs -  Physician Assistants and Nurse Practitioners) who all work together to provide you with the care you need, when you need it.  Your next appointment:   12 months  The format for your next appointment:   In Person  Provider:    You may see Kate Sable, MD or one of the following Advanced Practice Providers on your designated Care Team:    Murray Hodgkins, NP  Christell Faith, PA-C  Marrianne Mood, PA-C

## 2019-11-05 ENCOUNTER — Ambulatory Visit: Payer: Self-pay | Admitting: Gastroenterology

## 2019-11-05 ENCOUNTER — Encounter: Payer: Self-pay | Admitting: Gastroenterology

## 2019-11-05 ENCOUNTER — Ambulatory Visit: Payer: Self-pay | Admitting: Specialist

## 2019-11-05 NOTE — Progress Notes (Deleted)
°  Subjective:     Patient ID: Diana Galvan, female   DOB: 1966/12/14, 53 y.o.   MRN: [id number]  HPI   Review of Systems     Objective:   Physical Exam     Assessment:     ***    Plan:     ***

## 2019-11-12 ENCOUNTER — Ambulatory Visit: Payer: Self-pay | Admitting: Gerontology

## 2019-11-18 ENCOUNTER — Ambulatory Visit: Payer: Self-pay | Admitting: Gerontology

## 2019-11-18 ENCOUNTER — Other Ambulatory Visit: Payer: Self-pay

## 2019-11-18 DIAGNOSIS — M25511 Pain in right shoulder: Secondary | ICD-10-CM

## 2019-11-18 DIAGNOSIS — R200 Anesthesia of skin: Secondary | ICD-10-CM

## 2019-11-18 DIAGNOSIS — G8929 Other chronic pain: Secondary | ICD-10-CM

## 2019-11-18 MED ORDER — GABAPENTIN 100 MG PO CAPS: 100.0000 mg | ORAL_CAPSULE | Freq: Every day | ORAL | 30 refills | Status: DC

## 2019-11-18 NOTE — Progress Notes (Signed)
Established Patient Office Visit  Subjective:  Patient ID: Diana Galvan, female    DOB: 1966/11/18  Age: 53 y.o. MRN: [id number]  CC: No chief complaint on file. Patient consents to telephone visit and 2 patient identifiers was used to identify patient.  [facility] complaint of worsening right shoulder pain, numbness to right fore arm and fingers. She reports that she had a history of bone spur to her right shoulder 5 years ago, but she continues to experience intermittent sharp non radiating 8/10 pain. She states that it " feels like her arm is being removed from her shoulder", and the pain wakes her up at night sometimes. She states that taking Aleve twice daily minimally relieves her symptoms. She also states that she continues to experience intermittent numbness to her right fore arm and fingers.She states that the numbness has being going on for 4 years, but it's worsening. She reports that she hasn't taking any medication, but shaking her hand relieves numbness. She states that numbness affects her ADL and IADL.   She was seen by the Urologist  Dr Bonnita Hollow on 10/07/2019 for recurrent hematuria and she was referred for pelvic floor physical therapy. Renal / Urinary tract ultrasound which was done on 10/21/2019 was normal. She also was evaluated by the Cardiologist Dr Garen Lah B for precordial pain and DOE on 11/04/2019, TTE and Coronary CTA were done and it showed Mild non-obstructive CAD (25-49%). EKG that was done demonstrates normal sinus rhythm and first-degree AV block. 81 mg Aspirin and Atorvastatin 40 mg was prescribed. She denies chest pain, palpitation, myalgia and muscle weakness. She states that she's doing well and offers no further complaint.  Past Medical History:  Diagnosis Date  . Asthma   . Coronary artery disease    mild cad in mLAD on coronary CTA  . Costochondritis   . DDD (degenerative disc disease), lumbar   . Depression    . Thyroid disease   . Thyroid goiter     Past Surgical History:  Procedure Laterality Date  . APPENDECTOMY    . THYROIDECTOMY    . TUBAL LIGATION    . VAGINAL HYSTERECTOMY  10/30/2002    Family History  Problem Relation Age of Onset  . Mental illness Mother   . Heart Problems Mother   . Congestive Heart Failure Paternal Aunt   . Heart failure Paternal Aunt   . Breast cancer Cousin   . Heart failure Paternal Grandmother     Social History   Socioeconomic History  . Marital status: Divorced    Spouse name: Not on file  . Number of children: Not on file  . Years of education: Not on file  . Highest education level: Not on file  Occupational History  . Not on file  Social Needs  . Financial resource strain: Not on file  . Food insecurity    Worry: Not on file    Inability: Not on file  . Transportation needs    Medical: Not on file    Non-medical: Not on file  Tobacco Use  . Smoking status: Current Some Day Smoker    Packs/day: 0.25    Years: 38.00    Pack years: 9.50    Types: Cigarettes  . Smokeless tobacco: Never Used  Substance and Sexual Activity  . Alcohol use: Yes    Alcohol/week: 1.0 standard drinks    Types: 1 Glasses of wine per week  . Drug use: No  Types: "Crack" cocaine    Comment: Recovered 2001  . Sexual activity: Yes    Partners: Male  Lifestyle  . Physical activity    Days per week: Not on file    Minutes per session: Not on file  . Stress: Not on file  Relationships  . Social Musician on phone: Not on file    Gets together: Not on file    Attends religious service: Not on file    Active member of club or organization: Not on file    Attends meetings of clubs or organizations: Not on file    Relationship status: Not on file  . Intimate partner violence    Fear of current or ex partner: Not on file    Emotionally abused: Not on file    Physically abused: Not on file    Forced sexual activity: Not on file  Other  Topics Concern  . Not on file  Social History Narrative  . Not on file    Outpatient Medications Prior to Visit  Medication Sig Dispense Refill  . albuterol (PROVENTIL HFA;VENTOLIN HFA) 108 (90 Base) MCG/ACT inhaler Inhale 2 puffs into the lungs every 6 (six) hours as needed for wheezing or shortness of breath. 1 Inhaler 2  . aspirin EC 81 MG tablet Take 1 tablet (81 mg total) by mouth daily. 90 tablet 2  . atorvastatin (LIPITOR) 40 MG tablet Take 1 tablet (40 mg total) by mouth daily. 90 tablet 3  . Fluticasone-Salmeterol (ADVAIR) 250-50 MCG/DOSE AEPB Inhale 1 puff into the lungs daily. 60 each 1  . ipratropium-albuterol (DUONEB) 0.5-2.5 (3) MG/3ML SOLN Take 3 mLs by nebulization every 6 (six) hours as needed. 360 mL 1  . montelukast (SINGULAIR) 10 MG tablet Take 1 tablet (10 mg total) by mouth at bedtime. 30 tablet 1  . NON FORMULARY Sea moss 2 tbs daily    . ibuprofen (ADVIL) 800 MG tablet Take 1 tablet (800 mg total) by mouth 2 (two) times daily. 40 tablet 0  . omeprazole (PRILOSEC) 20 MG capsule Take 1 capsule (20 mg total) by mouth daily. (Patient taking differently: Take 20 mg by mouth daily as needed. ) 30 capsule 2  . witch hazel-glycerin (TUCKS) pad Apply 1 application topically as needed for itching or hemorrhoids. (Patient not taking: Reported on 11/04/2019) 40 each 0   No facility-administered medications prior to visit.     Allergies  Allergen Reactions  . Adhesive [Tape] Other (See Comments)    Blisters and burning  . Wool Alcohol [Lanolin] Itching    ROS Review of Systems  Constitutional: Negative.   Respiratory: Negative.   Cardiovascular: Negative.   Musculoskeletal: Positive for arthralgias (right shoulder pain).  Skin: Negative.   Neurological: Positive for numbness (right forearm and fingers.).  Psychiatric/Behavioral: Negative.       Objective:    Physical Exam No PE was done There were no vitals taken for this visit. Wt Readings from Last 3  Encounters:  11/04/19 237 lb 4 oz (107.6 kg)  10/07/19 232 lb (105.2 kg)  09/24/19 237 lb 3.2 oz (107.6 kg)   She gained 5 pounds and was encouraged to continue on her weight loss regimen.  Health Maintenance Due  Topic Date Due  . TETANUS/TDAP  11/14/1984  . PAP SMEAR-Modifier  11/14/1986  . MAMMOGRAM  11/15/2015  . COLONOSCOPY  11/15/2015    There are no preventive care reminders to display for this patient.  Lab Results  Component  Value Date   TSH 2.690 05/01/2019   Lab Results  Component Value Date   WBC 6.6 07/01/2019   HGB 11.3 (L) 07/01/2019   HCT 35.0 (L) 07/01/2019   MCV 89.1 07/01/2019   PLT 368 07/01/2019   Lab Results  Component Value Date   NA 141 09/26/2019   K 4.1 09/26/2019   CO2 27 09/26/2019   GLUCOSE 92 09/26/2019   BUN 11 09/26/2019   CREATININE 0.68 09/26/2019   BILITOT 0.2 05/01/2019   ALKPHOS 87 05/01/2019   AST 18 05/01/2019   ALT 13 05/01/2019   PROT 7.1 05/01/2019   ALBUMIN 4.6 05/01/2019   CALCIUM 9.5 09/26/2019   ANIONGAP 9 09/26/2019   Lab Results  Component Value Date   CHOL 176 05/01/2019   Lab Results  Component Value Date   HDL 51 05/01/2019   Lab Results  Component Value Date   LDLCALC 109 (H) 05/01/2019   Lab Results  Component Value Date   TRIG 78 05/01/2019   Lab Results  Component Value Date   CHOLHDL 3.5 05/01/2019   Lab Results  Component Value Date   HGBA1C 5.4 05/01/2019      Assessment & Plan:     1. Chronic right shoulder pain - She was advised to take otc Aleve and to notify clinic or go to the ED for worsening pain. She missed an appointment with Orthopedic Surgery Dr Justice RocherFossier and was encouraged to keep her 12/31/2019 appointment.  2. Numbness of fingers - She will continue on 100 mg gabapentin and was educated on medication side effect and advised to notify clinic. She was encouraged to go to the ED with worsening numbness. - Ambulatory referral to Neurology - gabapentin (NEURONTIN) 100 MG  capsule; Take 1 capsule (100 mg total) by mouth at bedtime.  Dispense: 100 capsule; Refill: 30   Follow-up: Return in about 4 weeks (around 12/16/2019), or if symptoms worsen or fail to improve.    Diana Eckstein Trellis PaganiniE Alekai Pocock, NP

## 2019-11-25 ENCOUNTER — Other Ambulatory Visit: Payer: Self-pay

## 2019-11-25 ENCOUNTER — Ambulatory Visit: Payer: Self-pay | Admitting: Urology

## 2019-11-25 ENCOUNTER — Encounter: Payer: Self-pay | Admitting: Urology

## 2019-11-25 VITALS — BP 138/89 | Ht 70.0 in | Wt 250.0 lb

## 2019-11-25 DIAGNOSIS — R3129 Other microscopic hematuria: Secondary | ICD-10-CM

## 2019-11-25 MED ORDER — LIDOCAINE HCL URETHRAL/MUCOSAL 2 % EX GEL: 1.0000 "application " | Freq: Once | CUTANEOUS | Status: DC

## 2019-11-25 NOTE — Progress Notes (Signed)
Cystoscopy Procedure Note:  Indication: Microscopic hematuria  After informed consent and discussion of the procedure and its risks, [facility] the standard fashion. Cystoscopy was performed with a flexible cystoscope. The urethra, bladder neck and entire bladder was visualized in a standard fashion. The ureteral orifices were visualized in their normal location and orientation. No abnormalities on retroflexion, bladder mucosa normal throughout.  Imaging: Renal US 10/26 with no hydronephrosis, masses, or urolithiasis  Findings: Normal cystoscopy  Assessment and Plan: Follow up as needed  Nickolas Madrid, MD 11/25/2019

## 2019-12-02 ENCOUNTER — Other Ambulatory Visit: Payer: Self-pay | Admitting: Urology

## 2019-12-03 ENCOUNTER — Encounter: Payer: Self-pay | Admitting: Internal Medicine

## 2019-12-03 ENCOUNTER — Encounter: Payer: Self-pay | Admitting: Physician Assistant

## 2019-12-16 ENCOUNTER — Ambulatory Visit: Payer: Self-pay | Admitting: Gerontology

## 2019-12-31 ENCOUNTER — Ambulatory Visit: Payer: Self-pay | Admitting: Specialist

## 2020-01-20 ENCOUNTER — Ambulatory Visit: Payer: Self-pay | Admitting: Diagnostic Neuroimaging

## 2020-01-22 ENCOUNTER — Encounter: Payer: Self-pay | Admitting: Gastroenterology

## 2020-01-22 ENCOUNTER — Other Ambulatory Visit: Payer: Self-pay

## 2020-01-22 ENCOUNTER — Ambulatory Visit (INDEPENDENT_AMBULATORY_CARE_PROVIDER_SITE_OTHER): Payer: Self-pay | Admitting: Gastroenterology

## 2020-01-22 VITALS — BP 111/76 | HR 55 | Temp 98.0°F | Ht 67.0 in | Wt 239.2 lb

## 2020-01-22 DIAGNOSIS — Z86010 Personal history of colonic polyps: Secondary | ICD-10-CM

## 2020-01-22 MED ORDER — GOLYTELY 236 G PO SOLR: ORAL | 0 refills | Status: DC

## 2020-01-22 NOTE — H&P (View-Only) (Signed)
  Gastroenterology Consultation  Referring Provider:     Iloabachie, Chioma E, NP Primary Care Physician:  Iloabachie, Chioma E, NP Primary Gastroenterologist:  Dr. Sharonica Kraszewski     Reason for Consultation:     Abdominal pain        HPI:   Diana Galvan is a 54 y.o. y/o female referred for consultation & management of abdominal pain by Dr. Iloabachie, Chioma E, NP.  This patient comes in today stating that she has a history of diverticulitis.  The patient on further questioning appears to have had diverticulosis at her last colonoscopy and reports that she had polyps at her last colonoscopy.  She is quite concerned because she has second-degree relatives who had colon cancer including her grandmother.  The patient reports that she has been losing weight but she has been trying.  There is no report of any unexplained weight loss fevers chills nausea or vomiting.  The patient denies any black stools or bloody stools.  She also denies any change in bowel habits.  She appears quite concerned and voices that she is 2 years overdue from what it was recommended to have a repeat colonoscopy so she wanted to get that done.  The patient seems to be concerned about being proactive and catching anything earlier than later.  Past Medical History:  Diagnosis Date  . Asthma   . Coronary artery disease    mild cad in mLAD on coronary CTA  . Costochondritis   . DDD (degenerative disc disease), lumbar   . Depression   . Thyroid disease   . Thyroid goiter     Past Surgical History:  Procedure Laterality Date  . APPENDECTOMY    . THYROIDECTOMY    . TUBAL LIGATION    . VAGINAL HYSTERECTOMY  10/30/2002    Prior to Admission medications   Medication Sig Start Date End Date Taking? Authorizing Provider  albuterol (PROVENTIL HFA;VENTOLIN HFA) 108 (90 Base) MCG/ACT inhaler Inhale 2 puffs into the lungs every 6 (six) hours as needed for wheezing or shortness of breath. 04/02/19  Yes Iloabachie, Chioma E, NP    aspirin EC 81 MG tablet Take 1 tablet (81 mg total) by mouth daily. 11/04/19  Yes Agbor-Etang, Brian, MD  atorvastatin (LIPITOR) 40 MG tablet Take 1 tablet (40 mg total) by mouth daily. 11/04/19 02/02/20 Yes Agbor-Etang, Brian, MD  Fluticasone-Salmeterol (ADVAIR) 250-50 MCG/DOSE AEPB Inhale 1 puff into the lungs daily. 05/09/19  Yes Iloabachie, Chioma E, NP  gabapentin (NEURONTIN) 100 MG capsule Take 1 capsule (100 mg total) by mouth at bedtime. 11/18/19  Yes Iloabachie, Chioma E, NP  ipratropium-albuterol (DUONEB) 0.5-2.5 (3) MG/3ML SOLN Take 3 mLs by nebulization every 6 (six) hours as needed. 08/15/19  Yes Iloabachie, Chioma E, NP  montelukast (SINGULAIR) 10 MG tablet Take 1 tablet (10 mg total) by mouth at bedtime. 05/09/19  Yes Iloabachie, Chioma E, NP  NON FORMULARY Sea moss 2 tbs daily   Yes [provider]  polyethylene glycol (GOLYTELY) 236 g solution Drink one 8 oz glass every 20 mins until entire container is finished starting at 5:00pm on 02/02/20 01/22/20   Stpehanie Montroy, MD    Family History  Problem Relation Age of Onset  . Mental illness Mother   . Heart Problems Mother   . Congestive Heart Failure Paternal Aunt   . Heart failure Paternal Aunt   . Breast cancer Cousin   . Heart failure Paternal Grandmother      Social History     Tobacco Use  . Smoking status: Current Some Day Smoker    Packs/day: 0.25    Years: 38.00    Pack years: 9.50    Types: Cigarettes  . Smokeless tobacco: Never Used  Substance Use Topics  . Alcohol use: Yes    Alcohol/week: 1.0 standard drinks    Types: 1 Glasses of wine per week  . Drug use: No    Types: "Crack" cocaine    Comment: Recovered 2001    Allergies as of 01/22/2020 - Review Complete 01/22/2020  Allergen Reaction Noted  . Adhesive [tape] Other (See Comments) 09/11/2019  . Wool alcohol [lanolin] Itching 09/11/2019    Review of Systems:    All systems reviewed and negative except where noted in HPI.   Physical Exam:  BP  111/76   Pulse (!) 55   Temp 98 F (36.7 C) (Oral)   Ht 5\' 7"  (1.702 m)   Wt 239 lb 3.2 oz (108.5 kg)   BMI 37.46 kg/m  No LMP recorded. Patient has had a hysterectomy. General:   Alert,  Well-developed, well-nourished, pleasant and cooperative in NAD Head:  Normocephalic and atraumatic. Eyes:  Sclera clear, no icterus.   Conjunctiva pink. Ears:  Normal auditory acuity. Neck:  Supple; no masses or thyromegaly. Lungs:  Respirations even and unlabored.  Clear throughout to auscultation.   No wheezes, crackles, or rhonchi. No acute distress. Heart:  Regular rate and rhythm; no murmurs, clicks, rubs, or gallops. Abdomen:  Normal bowel sounds.  No bruits.  Soft, non-tender and non-distended without masses, hepatosplenomegaly or hernias noted.  No guarding or rebound tenderness.  Negative Carnett sign.   Rectal:  Deferred.  Pulses:  Normal pulses noted. Extremities:  No clubbing or edema.  No cyanosis. Neurologic:  Alert and oriented x3;  grossly normal neurologically. Skin:  Intact without significant lesions or rashes.  No jaundice. Lymph Nodes:  No significant cervical adenopathy. Psych:  Alert and cooperative. Normal mood and affect.  Imaging Studies: No results found.  Assessment and Plan:   Diana Galvan is a 54 y.o. y/o female who comes in today with a history of intermittent bloating and abdominal discomfort with diverticulosis at her last colonoscopy and recommendation to have a repeat colonoscopy in 3 years.  The patient reports that was 5 years ago.  The patient will be set up for a colonoscopy due to her history of colon polyps. I have discussed risks & benefits which include, but are not limited to, bleeding, infection, perforation & drug reaction.  The patient agrees with this plan & written consent will be obtained.       56, MD. Midge Minium    Note: This dictation was prepared with Dragon dictation along with smaller phrase technology. Any transcriptional  errors that result from this process are unintentional.

## 2020-01-22 NOTE — Progress Notes (Signed)
Gastroenterology Consultation  Referring Provider:     Langston Reusing, NP Primary Care Physician:  Langston Reusing, NP Primary Gastroenterologist:  Dr. Allen Norris     Reason for Consultation:     Abdominal pain        HPI:   Diana Galvan is a 55 y.o. y/o female referred for consultation & management of abdominal pain by Dr. Hattie Perch, Lonny Prude, NP.  This patient comes in today stating that she has a history of diverticulitis.  The patient on further questioning appears to have had diverticulosis at her last colonoscopy and reports that she had polyps at her last colonoscopy.  She is quite concerned because she has second-degree relatives who had colon cancer including her grandmother.  The patient reports that she has been losing weight but she has been trying.  There is no report of any unexplained weight loss fevers chills nausea or vomiting.  The patient denies any black stools or bloody stools.  She also denies any change in bowel habits.  She appears quite concerned and voices that she is 2 years overdue from what it was recommended to have a repeat colonoscopy so she wanted to get that done.  The patient seems to be concerned about being proactive and catching anything earlier than later.  Past Medical History:  Diagnosis Date  . Asthma   . Coronary artery disease    mild cad in mLAD on coronary CTA  . Costochondritis   . DDD (degenerative disc disease), lumbar   . Depression   . Thyroid disease   . Thyroid goiter     Past Surgical History:  Procedure Laterality Date  . APPENDECTOMY    . THYROIDECTOMY    . TUBAL LIGATION    . VAGINAL HYSTERECTOMY  10/30/2002    Prior to Admission medications   Medication Sig Start Date End Date Taking? Authorizing Provider  albuterol (PROVENTIL HFA;VENTOLIN HFA) 108 (90 Base) MCG/ACT inhaler Inhale 2 puffs into the lungs every 6 (six) hours as needed for wheezing or shortness of breath. 04/02/19  Yes Iloabachie, Chioma E, NP    aspirin EC 81 MG tablet Take 1 tablet (81 mg total) by mouth daily. 11/04/19  Yes Agbor-Etang, Aaron Edelman, MD  atorvastatin (LIPITOR) 40 MG tablet Take 1 tablet (40 mg total) by mouth daily. 11/04/19 02/02/20 Yes Agbor-Etang, Aaron Edelman, MD  Fluticasone-Salmeterol (ADVAIR) 250-50 MCG/DOSE AEPB Inhale 1 puff into the lungs daily. 05/09/19  Yes Iloabachie, Chioma E, NP  gabapentin (NEURONTIN) 100 MG capsule Take 1 capsule (100 mg total) by mouth at bedtime. 11/18/19  Yes Iloabachie, Chioma E, NP  ipratropium-albuterol (DUONEB) 0.5-2.5 (3) MG/3ML SOLN Take 3 mLs by nebulization every 6 (six) hours as needed. 08/15/19  Yes Iloabachie, Chioma E, NP  montelukast (SINGULAIR) 10 MG tablet Take 1 tablet (10 mg total) by mouth at bedtime. 05/09/19  Yes Iloabachie, Chioma E, NP  NON FORMULARY Sea moss 2 tbs daily   Yes [provider]  polyethylene glycol (GOLYTELY) 236 g solution Drink one 8 oz glass every 20 mins until entire container is finished starting at 5:00pm on 02/02/20 01/22/20   Lucilla Lame, MD    Family History  Problem Relation Age of Onset  . Mental illness Mother   . Heart Problems Mother   . Congestive Heart Failure Paternal Aunt   . Heart failure Paternal Aunt   . Breast cancer Cousin   . Heart failure Paternal Grandmother      Social History  Tobacco Use  . Smoking status: Current Some Day Smoker    Packs/day: 0.25    Years: 38.00    Pack years: 9.50    Types: Cigarettes  . Smokeless tobacco: Never Used  Substance Use Topics  . Alcohol use: Yes    Alcohol/week: 1.0 standard drinks    Types: 1 Glasses of wine per week  . Drug use: No    Types: "Crack" cocaine    Comment: Recovered 2001    Allergies as of 01/22/2020 - Review Complete 01/22/2020  Allergen Reaction Noted  . Adhesive [tape] Other (See Comments) 09/11/2019  . Wool alcohol [lanolin] Itching 09/11/2019    Review of Systems:    All systems reviewed and negative except where noted in HPI.   Physical Exam:  BP  111/76   Pulse (!) 55   Temp 98 F (36.7 C) (Oral)   Ht 5\' 7"  (1.702 m)   Wt 239 lb 3.2 oz (108.5 kg)   BMI 37.46 kg/m  No LMP recorded. Patient has had a hysterectomy. General:   Alert,  Well-developed, well-nourished, pleasant and cooperative in NAD Head:  Normocephalic and atraumatic. Eyes:  Sclera clear, no icterus.   Conjunctiva pink. Ears:  Normal auditory acuity. Neck:  Supple; no masses or thyromegaly. Lungs:  Respirations even and unlabored.  Clear throughout to auscultation.   No wheezes, crackles, or rhonchi. No acute distress. Heart:  Regular rate and rhythm; no murmurs, clicks, rubs, or gallops. Abdomen:  Normal bowel sounds.  No bruits.  Soft, non-tender and non-distended without masses, hepatosplenomegaly or hernias noted.  No guarding or rebound tenderness.  Negative Carnett sign.   Rectal:  Deferred.  Pulses:  Normal pulses noted. Extremities:  No clubbing or edema.  No cyanosis. Neurologic:  Alert and oriented x3;  grossly normal neurologically. Skin:  Intact without significant lesions or rashes.  No jaundice. Lymph Nodes:  No significant cervical adenopathy. Psych:  Alert and cooperative. Normal mood and affect.  Imaging Studies: No results found.  Assessment and Plan:   Diana Galvan is a 54 y.o. y/o female who comes in today with a history of intermittent bloating and abdominal discomfort with diverticulosis at her last colonoscopy and recommendation to have a repeat colonoscopy in 3 years.  The patient reports that was 5 years ago.  The patient will be set up for a colonoscopy due to her history of colon polyps. I have discussed risks & benefits which include, but are not limited to, bleeding, infection, perforation & drug reaction.  The patient agrees with this plan & written consent will be obtained.       56, MD. Midge Minium    Note: This dictation was prepared with Dragon dictation along with smaller phrase technology. Any transcriptional  errors that result from this process are unintentional.

## 2020-01-28 ENCOUNTER — Ambulatory Visit: Payer: Self-pay | Admitting: Gerontology

## 2020-01-28 ENCOUNTER — Ambulatory Visit: Payer: Self-pay | Admitting: Specialist

## 2020-01-28 ENCOUNTER — Other Ambulatory Visit: Payer: Self-pay

## 2020-01-28 ENCOUNTER — Telehealth: Payer: Self-pay

## 2020-01-28 ENCOUNTER — Encounter: Payer: Self-pay | Admitting: Gerontology

## 2020-01-28 VITALS — BP 120/81 | HR 64 | Wt 245.0 lb

## 2020-01-28 DIAGNOSIS — J45909 Unspecified asthma, uncomplicated: Secondary | ICD-10-CM

## 2020-01-28 DIAGNOSIS — G8929 Other chronic pain: Secondary | ICD-10-CM

## 2020-01-28 DIAGNOSIS — R0981 Nasal congestion: Secondary | ICD-10-CM

## 2020-01-28 DIAGNOSIS — R200 Anesthesia of skin: Secondary | ICD-10-CM

## 2020-01-28 DIAGNOSIS — E785 Hyperlipidemia, unspecified: Secondary | ICD-10-CM

## 2020-01-28 MED ORDER — FLUTICASONE PROPIONATE 50 MCG/ACT NA SUSP: 1.0000 | Freq: Every day | NASAL | 0 refills | Status: DC

## 2020-01-28 MED ORDER — GABAPENTIN 100 MG PO CAPS: 100.0000 mg | ORAL_CAPSULE | Freq: Every day | ORAL | 3 refills | Status: DC

## 2020-01-28 MED ORDER — MONTELUKAST SODIUM 10 MG PO TABS: 10.0000 mg | ORAL_TABLET | Freq: Every day | ORAL | 1 refills | Status: DC

## 2020-01-28 NOTE — Telephone Encounter (Signed)
Patients call has been returned.  She has informed me that [facility] is out of SuPrep Bowel Kit.  I asked if there was another pharmacy she preferred me to sent the prep to, and she said she didn't know if she could afford it the rx because she is on charity care.  Patient has been asked to come by the office to pick up a SuPrep Bowel Kit.  Thanks , CMA 

## 2020-01-28 NOTE — Patient Instructions (Signed)
Fat and Cholesterol Restricted Eating Plan Getting too much fat and cholesterol in your diet may cause health problems. Choosing the right foods helps keep your fat and cholesterol at normal levels. This can keep you from getting certain diseases. Your doctor may recommend an eating plan that includes:  Total fat: ______% or less of total calories a day.  Saturated fat: ______% or less of total calories a day.  Cholesterol: less than _________mg a day.  Fiber: ______g a day. What are tips for following this plan? Meal planning  At meals, divide your plate into four equal parts: ? Fill one-half of your plate with vegetables and green salads. ? Fill one-fourth of your plate with whole grains. ? Fill one-fourth of your plate with low-fat (lean) protein foods.  Eat fish that is high in omega-3 fats at least two times a week. This includes mackerel, tuna, sardines, and salmon.  Eat foods that are high in fiber, such as whole grains, beans, apples, broccoli, carrots, peas, and barley. General tips   Work with your doctor to lose weight if you need to.  Avoid: ? Foods with added sugar. ? Fried foods. ? Foods with partially hydrogenated oils.  Limit alcohol intake to no more than 1 drink a day for nonpregnant women and 2 drinks a day for men. One drink equals 12 oz of beer, 5 oz of wine, or 1 oz of hard liquor. Reading food labels  Check food labels for: ? Trans fats. ? Partially hydrogenated oils. ? Saturated fat (g) in each serving. ? Cholesterol (mg) in each serving. ? Fiber (g) in each serving.  Choose foods with healthy fats, such as: ? Monounsaturated fats. ? Polyunsaturated fats. ? Omega-3 fats.  Choose grain products that have whole grains. Look for the word "whole" as the first word in the ingredient list. Cooking  Cook foods using low-fat methods. These include baking, boiling, grilling, and broiling.  Eat more home-cooked foods. Eat at restaurants and buffets  less often.  Avoid cooking using saturated fats, such as butter, cream, palm oil, palm kernel oil, and coconut oil. Recommended foods  Fruits  All fresh, canned (in natural juice), or frozen fruits. Vegetables  Fresh or frozen vegetables (raw, steamed, roasted, or grilled). Green salads. Grains  Whole grains, such as whole wheat or whole grain breads, crackers, cereals, and pasta. Unsweetened oatmeal, bulgur, barley, quinoa, or brown rice. Corn or whole wheat flour tortillas. Meats and other protein foods  Ground beef (85% or leaner), grass-fed beef, or beef trimmed of fat. Skinless chicken or turkey. Ground chicken or turkey. Pork trimmed of fat. All fish and seafood. Egg whites. Dried beans, peas, or lentils. Unsalted nuts or seeds. Unsalted canned beans. Nut butters without added sugar or oil. Dairy  Low-fat or nonfat dairy products, such as skim or 1% milk, 2% or reduced-fat cheeses, low-fat and fat-free ricotta or cottage cheese, or plain low-fat and nonfat yogurt. Fats and oils  Tub margarine without trans fats. Light or reduced-fat mayonnaise and salad dressings. Avocado. Olive, canola, sesame, or safflower oils. The items listed above may not be a complete list of foods and beverages you can eat. Contact a dietitian for more information. Foods to avoid Fruits  Canned fruit in heavy syrup. Fruit in cream or butter sauce. Fried fruit. Vegetables  Vegetables cooked in cheese, cream, or butter sauce. Fried vegetables. Grains  White bread. White pasta. White rice. Cornbread. Bagels, pastries, and croissants. Crackers and snack foods that contain trans fat   and hydrogenated oils. Meats and other protein foods  Fatty cuts of meat. Ribs, chicken wings, bacon, sausage, bologna, salami, chitterlings, fatback, hot dogs, bratwurst, and packaged lunch meats. Liver and organ meats. Whole eggs and egg yolks. Chicken and turkey with skin. Fried meat. Dairy  Whole or 2% milk, cream,  half-and-half, and cream cheese. Whole milk cheeses. Whole-fat or sweetened yogurt. Full-fat cheeses. Nondairy creamers and whipped toppings. Processed cheese, cheese spreads, and cheese curds. Beverages  Alcohol. Sugar-sweetened drinks such as sodas, lemonade, and fruit drinks. Fats and oils  Butter, stick margarine, lard, shortening, ghee, or bacon fat. Coconut, palm kernel, and palm oils. Sweets and desserts  Corn syrup, sugars, honey, and molasses. Candy. Jam and jelly. Syrup. Sweetened cereals. Cookies, pies, cakes, donuts, muffins, and ice cream. The items listed above may not be a complete list of foods and beverages you should avoid. Contact a dietitian for more information. Summary  Choosing the right foods helps keep your fat and cholesterol at normal levels. This can keep you from getting certain diseases.  At meals, fill one-half of your plate with vegetables and green salads.  Eat high-fiber foods, like whole grains, beans, apples, carrots, peas, and barley.  Limit added sugar, saturated fats, alcohol, and fried foods. This information is not intended to replace advice given to you by your health care provider. Make sure you discuss any questions you have with your health care provider. Document Revised: 08/15/2018 Document Reviewed: 08/29/2017 Elsevier Patient Education  2020 [employer]  

## 2020-01-28 NOTE — Progress Notes (Signed)
   Subjective:    Patient ID: Diana Galvan, female    DOB: [birth year], 54 y.o.   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 So her RUE is dominant. She has a long standing history of shoulder pain. She's had x-rays years ago and was told she has a bone spur. Now has intermittent pain along with occasional numbness of the fingers. She is unable to sleep on her R side. Because of the LUE, she has to hook her bra in the front.    Review of Systems     Objective:   Physical Exam R shoulder:  FF is 110 degrees; she has 40 degrees of EXT. She has 30 degrees of ADD. ABD is 100 degrees. IR/ER are 90 degrees.  Tinel's is neg. but Durkin's test is pos. For median nerve tingling.  2 point discrimination is variable, but is 7 MM in most fingers.  RC strength is in the RUE is 5/5  She says that she has a winging scapula on the L but I was not able to reproduce it today.  DTR's 1+ on the R side.  FROM of C-spine.    Assessment & Plan:  X-ray of R shoulder. Imp. : probable impingement R shoulder Return after x-ray

## 2020-01-28 NOTE — Progress Notes (Signed)
Established Patient Office Visit  Subjective:  Patient ID: Diana Galvan, female    DOB: 31-Aug-1966  Age: 54 y.o. MRN: [id number]  CC:  Chief Complaint  Patient presents with  . right shoulder pain    HPI Diana Galvan presents for right shoulder pain and numbness to right thumb, index and middle finger and medication refill.  She states that she continues to experience intermittent dull 5/10 pain to right shoulder with occasional numbness to right fingers. She states that taking gabapentin 100 mg moderately relieves symptoms.  She followed up with orthopedic surgeon Dr. Vickki Hearing at the clinic today, and right shoulder x-ray was recommended.  Currently, she states that she's experiencing nasal congestion and mild sinus pressure.  She denies sore throat, loss of sense of smell, taste, chest pain, palpitation, light headedness, myalgia, fever and chills. Overall, she states that she's doing well and offers no further complaint.  Past Medical History:  Diagnosis Date  . Asthma   . Coronary artery disease    mild cad in mLAD on coronary CTA  . Costochondritis   . DDD (degenerative disc disease), lumbar   . Depression   . Thyroid disease   . Thyroid goiter     Past Surgical History:  Procedure Laterality Date  . APPENDECTOMY    . THYROIDECTOMY    . TUBAL LIGATION    . VAGINAL HYSTERECTOMY  10/30/2002    Family History  Problem Relation Age of Onset  . Mental illness Mother   . Heart Problems Mother   . Congestive Heart Failure Paternal Aunt   . Heart failure Paternal Aunt   . Breast cancer Cousin   . Heart failure Paternal Grandmother     Social History   Socioeconomic History  . Marital status: Divorced    Spouse name: Not on file  . Number of children: Not on file  . Years of education: Not on file  . Highest education level: Not on file  Occupational History  . Not on file  Tobacco Use  . Smoking status: Current Some Day Smoker    Packs/day:  0.25    Years: 38.00    Pack years: 9.50    Types: Cigarettes  . Smokeless tobacco: Never Used  Substance and Sexual Activity  . Alcohol use: Yes    Alcohol/week: 1.0 standard drinks    Types: 1 Glasses of wine per week  . Drug use: No    Types: "Crack" cocaine    Comment: Recovered 2001  . Sexual activity: Yes    Partners: Male  Other Topics Concern  . Not on file  Social History Narrative  . Not on file   Social Determinants of Health   Financial Resource Strain:   . Difficulty of Paying Living Expenses: Not on file  Food Insecurity:   . Worried About Charity fundraiser in the Last Year: Not on file  . Ran Out of Food in the Last Year: Not on file  Transportation Needs:   . Lack of Transportation (Medical): Not on file  . Lack of Transportation (Non-Medical): Not on file  Physical Activity:   . Days of Exercise per Week: Not on file  . Minutes of Exercise per Session: Not on file  Stress:   . Feeling of Stress : Not on file  Social Connections:   . Frequency of Communication with Friends and Family: Not on file  . Frequency of Social Gatherings with Friends and Family: Not on file  .  Attends Religious Services: Not on file  . Active Member of Clubs or Organizations: Not on file  . Attends Banker Meetings: Not on file  . Marital Status: Not on file  Intimate Partner Violence:   . Fear of Current or Ex-Partner: Not on file  . Emotionally Abused: Not on file  . Physically Abused: Not on file  . Sexually Abused: Not on file    Outpatient Medications Prior to Visit  Medication Sig Dispense Refill  . albuterol (PROVENTIL HFA;VENTOLIN HFA) 108 (90 Base) MCG/ACT inhaler Inhale 2 puffs into the lungs every 6 (six) hours as needed for wheezing or shortness of breath. 1 Inhaler 2  . aspirin EC 81 MG tablet Take 1 tablet (81 mg total) by mouth daily. 90 tablet 2  . atorvastatin (LIPITOR) 40 MG tablet Take 1 tablet (40 mg total) by mouth daily. 90 tablet 3  .  Fluticasone-Salmeterol (ADVAIR) 250-50 MCG/DOSE AEPB Inhale 1 puff into the lungs daily. 60 each 1  . ipratropium-albuterol (DUONEB) 0.5-2.5 (3) MG/3ML SOLN Take 3 mLs by nebulization every 6 (six) hours as needed. 360 mL 1  . NON FORMULARY Sea moss 2 tbs daily    . polyethylene glycol (GOLYTELY) 236 g solution Drink one 8 oz glass every 20 mins until entire container is finished starting at 5:00pm on 02/02/20 4000 mL 0  . gabapentin (NEURONTIN) 100 MG capsule Take 1 capsule (100 mg total) by mouth at bedtime. 100 capsule 30  . montelukast (SINGULAIR) 10 MG tablet Take 1 tablet (10 mg total) by mouth at bedtime. 30 tablet 1   Facility-Administered Medications Prior to Visit  Medication Dose Route Frequency Provider Last Rate Last Admin  . lidocaine (XYLOCAINE) 2 % jelly 1 application  1 application Urethral Once Sondra Come, MD        Allergies  Allergen Reactions  . Adhesive [Tape] Other (See Comments)    Blisters and burning  . Wool Alcohol [Lanolin] Itching    ROS Review of Systems  Constitutional: Negative.   HENT: Positive for sinus pressure. Negative for sinus pain and sneezing.   Respiratory: Negative.   Cardiovascular: Negative.   Musculoskeletal: Positive for arthralgias (chronic right shoulder pain.).  Skin: Negative.   Neurological: Negative.   Psychiatric/Behavioral: Negative.       Objective:    Physical Exam  Constitutional: She is oriented to person, place, and time. She appears well-developed.  HENT:  Head: Normocephalic.  Nose and Mouth exam deferred per Covid Protocol  Eyes: Pupils are equal, round, and reactive to light. EOM are normal.  Cardiovascular: Normal rate and regular rhythm.  Pulmonary/Chest: Effort normal and breath sounds normal.  Neurological: She is alert and oriented to person, place, and time.  Skin: Skin is warm and dry.  Psychiatric: She has a normal mood and affect. Her behavior is normal. Judgment and thought content normal.     BP 120/81 (BP Location: Left Arm, Patient Position: Sitting, Cuff Size: Normal)   Pulse 64   Wt 245 lb (111.1 kg)   BMI 38.37 kg/m  Wt Readings from Last 3 Encounters:  01/28/20 245 lb (111.1 kg)  01/22/20 239 lb 3.2 oz (108.5 kg)  11/25/19 250 lb (113.4 kg)   She gained 6 pounds and was advised to continue on weight loss regimen.  Health Maintenance Due  Topic Date Due  . TETANUS/TDAP  11/14/1984  . PAP SMEAR-Modifier  11/14/1986  . MAMMOGRAM  11/15/2015  . COLONOSCOPY  11/15/2015  There are no preventive care reminders to display for this patient.  Lab Results  Component Value Date   TSH 2.690 05/01/2019   Lab Results  Component Value Date   WBC 6.6 07/01/2019   HGB 11.3 (L) 07/01/2019   HCT 35.0 (L) 07/01/2019   MCV 89.1 07/01/2019   PLT 368 07/01/2019   Lab Results  Component Value Date   NA 141 09/26/2019   K 4.1 09/26/2019   CO2 27 09/26/2019   GLUCOSE 92 09/26/2019   BUN 11 09/26/2019   CREATININE 0.68 09/26/2019   BILITOT 0.2 05/01/2019   ALKPHOS 87 05/01/2019   AST 18 05/01/2019   ALT 13 05/01/2019   PROT 7.1 05/01/2019   ALBUMIN 4.6 05/01/2019   CALCIUM 9.5 09/26/2019   ANIONGAP 9 09/26/2019   Lab Results  Component Value Date   CHOL 176 05/01/2019   Lab Results  Component Value Date   HDL 51 05/01/2019   Lab Results  Component Value Date   LDLCALC 109 (H) 05/01/2019   Lab Results  Component Value Date   TRIG 78 05/01/2019   Lab Results  Component Value Date   CHOLHDL 3.5 05/01/2019   Lab Results  Component Value Date   HGBA1C 5.4 05/01/2019      Assessment & Plan:    1. Asthma, unspecified asthma severity, unspecified whether complicated, unspecified whether persistent -Her asthma is under control, and she will continue on current medication.  She was advised to follow-up with pulmonologist. - montelukast (SINGULAIR) 10 MG tablet; Take 1 tablet (10 mg total) by mouth at bedtime.  Dispense: 30 tablet; Refill:  1  2.Numbness of fingers -Paresthesia is controlled with taking gabapentin. - gabapentin (NEURONTIN) 100 MG capsule; Take 1 capsule (100 mg total) by mouth at bedtime.  Dispense: 100 capsule; Refill: 3  3. Chronic right shoulder pain -She was advised to follow-up with x-ray of right shoulder and to notify clinic afterwards for an appointment with Dr. Justice Rocher to be scheduled. - DG Shoulder Right; Future  4. Nasal congestion -She will continue on Flonase, was educated on medication side effect and to notify the clinic. - fluticasone (FLONASE) 50 MCG/ACT nasal spray; Place 1 spray into both nostrils daily.  Dispense: 16 g; Refill: 0  5. Elevated lipids -She will continue on Lipitor 40 mg daily and will recheck- Lipid panel.    Follow-up: Return in about 3 months (around 05/06/2020), or if symptoms worsen or fail to improve.    Viveca Beckstrom Trellis Paganini, NP

## 2020-01-30 ENCOUNTER — Other Ambulatory Visit
Admission: RE | Admit: 2020-01-30 | Discharge: 2020-01-30 | Disposition: A | Discharge status: Home / Self Care | Payer: Self-pay | Source: Ambulatory Visit | Attending: Gastroenterology | Admitting: Gastroenterology

## 2020-01-30 DIAGNOSIS — Z01812 Encounter for preprocedural laboratory examination: Secondary | ICD-10-CM | POA: Insufficient documentation

## 2020-01-30 DIAGNOSIS — Z20822 Contact with and (suspected) exposure to covid-19: Secondary | ICD-10-CM | POA: Insufficient documentation

## 2020-01-31 ENCOUNTER — Encounter: Payer: Self-pay | Admitting: Gastroenterology

## 2020-01-31 LAB — SARS CORONAVIRUS 2 (TAT 6-24 HRS): SARS Coronavirus 2: NEGATIVE

## 2020-02-03 ENCOUNTER — Ambulatory Visit
Admission: RE | Admit: 2020-02-03 | Discharge: 2020-02-03 | Disposition: A | Discharge status: Home / Self Care | Payer: Self-pay | Source: Ambulatory Visit | Attending: Specialist | Admitting: Specialist

## 2020-02-03 ENCOUNTER — Ambulatory Visit: Payer: Self-pay | Admitting: Anesthesiology

## 2020-02-03 ENCOUNTER — Ambulatory Visit
Admission: RE | Admit: 2020-02-03 | Discharge: 2020-02-03 | Disposition: A | Discharge status: Home / Self Care | Payer: Self-pay | Attending: Gastroenterology | Admitting: Gastroenterology

## 2020-02-03 ENCOUNTER — Other Ambulatory Visit: Payer: Self-pay

## 2020-02-03 ENCOUNTER — Encounter
Admission: RE | Disposition: A | Discharge status: Home / Self Care | Payer: Self-pay | Source: Home / Self Care | Attending: Gastroenterology

## 2020-02-03 DIAGNOSIS — R109 Unspecified abdominal pain: Secondary | ICD-10-CM | POA: Insufficient documentation

## 2020-02-03 DIAGNOSIS — K648 Other hemorrhoids: Secondary | ICD-10-CM | POA: Insufficient documentation

## 2020-02-03 DIAGNOSIS — Z860100 Personal history of colon polyps, unspecified: Secondary | ICD-10-CM

## 2020-02-03 DIAGNOSIS — F329 Major depressive disorder, single episode, unspecified: Secondary | ICD-10-CM | POA: Insufficient documentation

## 2020-02-03 DIAGNOSIS — E079 Disorder of thyroid, unspecified: Secondary | ICD-10-CM | POA: Insufficient documentation

## 2020-02-03 DIAGNOSIS — I2510 Atherosclerotic heart disease of native coronary artery without angina pectoris: Secondary | ICD-10-CM | POA: Insufficient documentation

## 2020-02-03 DIAGNOSIS — M5136 Other intervertebral disc degeneration, lumbar region: Secondary | ICD-10-CM | POA: Insufficient documentation

## 2020-02-03 DIAGNOSIS — G8929 Other chronic pain: Secondary | ICD-10-CM

## 2020-02-03 DIAGNOSIS — F17210 Nicotine dependence, cigarettes, uncomplicated: Secondary | ICD-10-CM | POA: Insufficient documentation

## 2020-02-03 DIAGNOSIS — J45909 Unspecified asthma, uncomplicated: Secondary | ICD-10-CM | POA: Insufficient documentation

## 2020-02-03 DIAGNOSIS — Z7951 Long term (current) use of inhaled steroids: Secondary | ICD-10-CM | POA: Insufficient documentation

## 2020-02-03 DIAGNOSIS — Z79899 Other long term (current) drug therapy: Secondary | ICD-10-CM | POA: Insufficient documentation

## 2020-02-03 DIAGNOSIS — Z8249 Family history of ischemic heart disease and other diseases of the circulatory system: Secondary | ICD-10-CM | POA: Insufficient documentation

## 2020-02-03 DIAGNOSIS — Z7982 Long term (current) use of aspirin: Secondary | ICD-10-CM | POA: Insufficient documentation

## 2020-02-03 DIAGNOSIS — M25511 Pain in right shoulder: Secondary | ICD-10-CM | POA: Insufficient documentation

## 2020-02-03 DIAGNOSIS — K5730 Diverticulosis of large intestine without perforation or abscess without bleeding: Secondary | ICD-10-CM | POA: Insufficient documentation

## 2020-02-03 HISTORY — PX: COLONOSCOPY WITH PROPOFOL: SHX5780

## 2020-02-03 SURGERY — COLONOSCOPY WITH PROPOFOL
Anesthesia: General

## 2020-02-03 MED ORDER — SODIUM CHLORIDE 0.9 % IV SOLN
INTRAVENOUS | Status: DC
  Administered 2020-02-03: 09:00:00 1000 mL via INTRAVENOUS

## 2020-02-03 MED ORDER — PROPOFOL 10 MG/ML IV BOLUS
INTRAVENOUS | Status: DC | PRN
  Administered 2020-02-03: 80 mg via INTRAVENOUS

## 2020-02-03 MED ORDER — PROPOFOL 500 MG/50ML IV EMUL
INTRAVENOUS | Status: DC | PRN
  Administered 2020-02-03: 175 ug/kg/min via INTRAVENOUS

## 2020-02-03 NOTE — Op Note (Signed)
[facility] Patient Name: Diana Galvan Procedure Date: 02/03/2020 8:50 AM MRN: [id number] Account #: [id number] Date of Birth: [birth year] Admit Type: Outpatient Age: 54 Room: [facility] ENDO ROOM 4 Gender: Female Note Status: Finalized Procedure:             Colonoscopy Indications:           High risk colon cancer surveillance: Personal history                         of colonic polyps last colonoscopy 5 years ago and                         were adenomatous. Providers:             Midge Minium MD, MD Referring MD:          No Local Md, MD (Referring MD) Medicines:             Propofol per Anesthesia Complications:         No immediate complications. Procedure:             Pre-Anesthesia Assessment:                        - Prior to the procedure, a History and Physical was                         performed, and patient medications and allergies were                         reviewed. The patient's tolerance of previous                         anesthesia was also reviewed. The risks and benefits                         of the procedure and the sedation options and risks                         were discussed with the patient. All questions were                         answered, and informed consent was obtained. Prior                         Anticoagulants: The patient has taken no previous                         anticoagulant or antiplatelet agents. ASA Grade                         Assessment: II - A patient with mild systemic disease.                         After reviewing the risks and benefits, the patient                         was deemed in satisfactory condition to undergo the  procedure.                        After obtaining informed consent, the colonoscope was                         passed under direct vision. Throughout the procedure,                         the patient's blood pressure, pulse, and oxygen                   saturations were monitored continuously. The                         Colonoscope was introduced through the anus and                         advanced to the the cecum, identified by appendiceal                         orifice and ileocecal valve. The colonoscopy was                         performed without difficulty. The patient tolerated                         the procedure well. The quality of the bowel                         preparation was excellent. Findings:      The perianal and digital rectal examinations were normal. Pertinent       negatives include normal sphincter tone.      Multiple small-mouthed diverticula were found in the entire colon.      Non-bleeding internal hemorrhoids were found during retroflexion. The       hemorrhoids were Grade I (internal hemorrhoids that do not prolapse). Impression:            - Diverticulosis in the entire examined colon.                        - Non-bleeding internal hemorrhoids.                        - No specimens collected. Recommendation:        - Discharge patient to home.                        - Resume previous diet.                        - Continue present medications.                        - Repeat colonoscopy in 5 years for surveillance. Procedure Code(s):     --- Professional ---                        [phone], Colonoscopy, flexible; diagnostic, including  collection of specimen(s) by brushing or washing, when                         performed (separate procedure) Diagnosis Code(s):     --- Professional ---                        Z86.010, Personal history of colonic polyps CPT copyright 2019 American Medical Association. All rights reserved. The codes documented in this report are preliminary and upon coder review may  be revised to meet current compliance requirements. Midge Minium MD, MD 02/03/2020 9:15:34 AM This report has been signed electronically. Number of Addenda: 0 Note  Initiated On: 02/03/2020 8:50 AM Scope Withdrawal Time: 0 hours 6 minutes 32 seconds  Total Procedure Duration: 0 hours 11 minutes 32 seconds  Estimated Blood Loss:  Estimated blood loss: none.      [facility]

## 2020-02-03 NOTE — Anesthesia Preprocedure Evaluation (Signed)
Anesthesia Evaluation  Patient identified by MRN, date of birth, ID band Patient awake    Reviewed: Allergy & Precautions, H&P , NPO status , Patient's Chart, lab work & pertinent test results, reviewed documented beta blocker date and time   Airway Mallampati: II   Neck ROM: full    Dental  (+) Poor Dentition   Pulmonary asthma , Current Smoker,    Pulmonary exam normal        Cardiovascular Exercise Tolerance: Poor + CAD  Normal cardiovascular exam Rhythm:regular Rate:Normal     Neuro/Psych PSYCHIATRIC DISORDERS Depression Bipolar Disorder negative neurological ROS     GI/Hepatic Neg liver ROS, GERD  Medicated,  Endo/Other  negative endocrine ROS  Renal/GU negative Renal ROS  negative genitourinary   Musculoskeletal   Abdominal   Peds  Hematology negative hematology ROS (+)   Anesthesia Other Findings Past Medical History: No date: Asthma No date: Coronary artery disease     Comment:  mild cad in mLAD on coronary CTA No date: Costochondritis No date: DDD (degenerative disc disease), lumbar No date: Depression No date: Thyroid disease No date: Thyroid goiter Past Surgical History: No date: APPENDECTOMY No date: THYROIDECTOMY No date: TUBAL LIGATION 10/30/2002: VAGINAL HYSTERECTOMY   Reproductive/Obstetrics negative OB ROS                             Anesthesia Physical Anesthesia Plan  ASA: III  Anesthesia Plan: General   Post-op Pain Management:    Induction:   PONV Risk Score and Plan:   Airway Management Planned:   Additional Equipment:   Intra-op Plan:   Post-operative Plan:   Informed Consent: I have reviewed the patients History and Physical, chart, labs and discussed the procedure including the risks, benefits and alternatives for the proposed anesthesia with the patient or authorized representative who has indicated his/her understanding and acceptance.      Dental Advisory Given  Plan Discussed with: CRNA  Anesthesia Plan Comments:         Anesthesia Quick Evaluation

## 2020-02-03 NOTE — Interval H&P Note (Signed)
History and Physical Interval Note:  02/03/2020 8:53 AM  [facility]  [facility], with the diagnosis of Hx of colon polyps Z86.010.  The various methods of treatment have been discussed with the patient and family. After consideration of risks, benefits and other options for treatment, the patient has consented to  Procedure(s) with comments: COLONOSCOPY WITH PROPOFOL (N/A) - Priority 4 as a surgical intervention.  The patient's history has been reviewed, patient examined, no change in status, stable for surgery.  I have reviewed the patient's chart and labs.  Questions were answered to the patient's satisfaction.     [name] [employer]

## 2020-02-03 NOTE — Anesthesia Procedure Notes (Signed)
Date/Time: 02/03/2020 9:01 AM Performed by: Junious Silk, CRNA Pre-anesthesia Checklist: Patient identified, Emergency Drugs available, Suction available, Patient being monitored and Timeout performed Oxygen Delivery Method: Nasal cannula

## 2020-02-03 NOTE — Transfer of Care (Signed)
Immediate Anesthesia Transfer of Care Note  Patient: Diana Galvan' Eslinger  Procedure(s) Performed: COLONOSCOPY WITH PROPOFOL (N/A )  Patient Location: PACU  Anesthesia Type:General  Level of Consciousness: sedated  Airway & Oxygen Therapy: Patient Spontanous Breathing and Patient connected to nasal cannula oxygen  Post-op Assessment: Report given to RN and Post -op Vital signs reviewed and stable  Post vital signs: Reviewed and stable  Last Vitals:  Vitals Value Taken Time  BP 98/63 02/03/20 0916  Temp 36.6 C 02/03/20 0916  Pulse 65 02/03/20 0917  Resp 13 02/03/20 0917  SpO2 97 % 02/03/20 0917  Vitals shown include unvalidated device data.  Last Pain:  Vitals:   02/03/20 0916  TempSrc: Temporal  PainSc: Asleep         Complications: No apparent anesthesia complications

## 2020-02-04 ENCOUNTER — Encounter: Payer: Self-pay | Admitting: *Deleted

## 2020-02-04 NOTE — Anesthesia Postprocedure Evaluation (Signed)
Anesthesia Post Note  Patient: Diana Galvan  Procedure(s) Performed: COLONOSCOPY WITH PROPOFOL (N/A )  Patient location during evaluation: PACU Anesthesia Type: General Level of consciousness: awake and alert Pain management: pain level controlled Vital Signs Assessment: post-procedure vital signs reviewed and stable Respiratory status: spontaneous breathing, nonlabored ventilation, respiratory function stable and patient connected to nasal cannula oxygen Cardiovascular status: blood pressure returned to baseline and stable Postop Assessment: no apparent nausea or vomiting Anesthetic complications: no     Last Vitals:  Vitals:   02/03/20 0916 02/03/20 0936  BP: 98/63 113/76  Pulse: 67   Resp: 13   Temp: 36.6 C   SpO2: 97%     Last Pain:  Vitals:   02/03/20 0936  TempSrc:   PainSc: 0-No pain                 Lenard Simmer

## 2020-02-06 ENCOUNTER — Encounter: Payer: Self-pay | Admitting: Gastroenterology

## 2020-02-06 ENCOUNTER — Ambulatory Visit: Payer: Self-pay | Admitting: Gastroenterology

## 2020-02-17 ENCOUNTER — Ambulatory Visit: Payer: Self-pay | Admitting: Diagnostic Neuroimaging

## 2020-02-27 ENCOUNTER — Encounter: Payer: Self-pay | Admitting: Gastroenterology

## 2020-02-27 ENCOUNTER — Encounter: Payer: Self-pay | Admitting: *Deleted

## 2020-02-27 ENCOUNTER — Ambulatory Visit: Payer: Self-pay | Admitting: Gastroenterology

## 2020-03-12 ENCOUNTER — Telehealth: Payer: Self-pay | Admitting: Pharmacist

## 2020-03-12 NOTE — Telephone Encounter (Signed)
03/12/2020 3:59:31 PM - Advair & Ventolin RTS for non pickup  -- Rhetta Mura - Thursday, March 12, 2020 3:56 PM --Received [employer] invoice in my box today the invoice is dated 12/16/2019 where we received Advair 250/50 [phone]) & Ventolin [phone]) note on invoice RTS FOR NON PICK UP - NOT DISPENSED.

## 2020-04-02 ENCOUNTER — Telehealth: Payer: Self-pay | Admitting: Gerontology

## 2020-04-02 NOTE — Telephone Encounter (Signed)
Pt called at 11:45 on 4/8; stated that she needs her medications refilled, particularly her nasal spray.

## 2020-04-07 ENCOUNTER — Ambulatory Visit: Payer: Self-pay | Admitting: Specialist

## 2020-04-28 ENCOUNTER — Other Ambulatory Visit: Payer: Self-pay | Admitting: Gerontology

## 2020-04-28 DIAGNOSIS — R0981 Nasal congestion: Secondary | ICD-10-CM

## 2020-04-28 DIAGNOSIS — E785 Hyperlipidemia, unspecified: Secondary | ICD-10-CM

## 2020-04-28 DIAGNOSIS — J45909 Unspecified asthma, uncomplicated: Secondary | ICD-10-CM

## 2020-04-28 MED ORDER — IPRATROPIUM-ALBUTEROL 0.5-2.5 (3) MG/3ML IN SOLN: 3.0000 mL | Freq: Four times a day (QID) | RESPIRATORY_TRACT | 1 refills | Status: DC | PRN

## 2020-04-28 MED ORDER — ALBUTEROL SULFATE HFA 108 (90 BASE) MCG/ACT IN AERS: 2.0000 | INHALATION_SPRAY | Freq: Four times a day (QID) | RESPIRATORY_TRACT | 2 refills | Status: DC | PRN

## 2020-04-28 MED ORDER — ATORVASTATIN CALCIUM 40 MG PO TABS: 40.0000 mg | ORAL_TABLET | Freq: Every day | ORAL | 3 refills | Status: DC

## 2020-04-28 MED ORDER — FLUTICASONE PROPIONATE 50 MCG/ACT NA SUSP: 1.0000 | Freq: Every day | NASAL | 2 refills | Status: DC

## 2020-04-29 ENCOUNTER — Other Ambulatory Visit: Payer: Self-pay

## 2020-05-06 ENCOUNTER — Ambulatory Visit: Payer: Self-pay | Admitting: Gerontology

## 2020-05-27 ENCOUNTER — Other Ambulatory Visit: Payer: Self-pay

## 2020-06-03 ENCOUNTER — Ambulatory Visit: Payer: Self-pay | Admitting: Gerontology

## 2020-08-26 ENCOUNTER — Other Ambulatory Visit: Payer: Self-pay

## 2020-08-26 ENCOUNTER — Encounter: Payer: Self-pay | Admitting: Gerontology

## 2020-08-26 ENCOUNTER — Ambulatory Visit: Payer: Self-pay | Admitting: Gerontology

## 2020-08-26 VITALS — BP 128/84 | HR 91 | Ht 67.0 in | Wt 236.0 lb

## 2020-08-26 DIAGNOSIS — R059 Cough, unspecified: Secondary | ICD-10-CM

## 2020-08-26 DIAGNOSIS — J45909 Unspecified asthma, uncomplicated: Secondary | ICD-10-CM

## 2020-08-26 MED ORDER — FLUTICASONE-SALMETEROL 250-50 MCG/DOSE IN AEPB: 1.0000 | INHALATION_SPRAY | Freq: Every day | RESPIRATORY_TRACT | 1 refills | Status: DC

## 2020-08-26 MED ORDER — ALBUTEROL SULFATE HFA 108 (90 BASE) MCG/ACT IN AERS: 2.0000 | INHALATION_SPRAY | Freq: Four times a day (QID) | RESPIRATORY_TRACT | 2 refills | Status: DC | PRN

## 2020-08-26 MED ORDER — BENZONATATE 100 MG PO CAPS: 100.0000 mg | ORAL_CAPSULE | Freq: Three times a day (TID) | ORAL | 0 refills | Status: DC | PRN

## 2020-08-26 NOTE — Patient Instructions (Signed)

## 2020-08-26 NOTE — Progress Notes (Signed)
Established Patient Office Visit  Subjective:  Patient ID: Diana Galvan, female    DOB: Jun 08, 1966  Age: 54 y.o. MRN: [id number]  CC:  Chief Complaint  Patient presents with   Asthma    worsening with humidity    [facility] that has been going on for 2 weeks. She states that cough wakes her up at night, denies sick contacts. She has a history of Asthma, and reports experiencing intermittent shortness of breath and intermittent wheezing with humid weather. She states that she uses her Albuterol inhaler and Advair as ordered. She reports smoking 1 cigarette every other day.  She is non compliant with her follow up appointments. Overall, she states that she's doing well and offers no further complaint.  Past Medical History:  Diagnosis Date   Asthma    Coronary artery disease    mild cad in mLAD on coronary CTA   Costochondritis    DDD (degenerative disc disease), lumbar    Depression    Thyroid disease    Thyroid goiter     Past Surgical History:  Procedure Laterality Date   APPENDECTOMY     COLONOSCOPY WITH PROPOFOL N/A 02/03/2020   Procedure: COLONOSCOPY WITH PROPOFOL;  Surgeon: Midge Minium, MD;  Location: [facility] ENDOSCOPY;  Service: Endoscopy;  Laterality: N/A;  Priority 4   THYROIDECTOMY     TUBAL LIGATION     VAGINAL HYSTERECTOMY  10/30/2002    Family History  Problem Relation Age of Onset   Mental illness Mother    Heart Problems Mother    Congestive Heart Failure Paternal Aunt    Heart failure Paternal Aunt    Breast cancer Cousin    Heart failure Paternal Grandmother     Social History   Socioeconomic History   Marital status: Divorced    Spouse name: Not on file   Number of children: Not on file   Years of education: Not on file   Highest education level: Not on file  Occupational History   Not on file  Tobacco Use   Smoking status: Current Some Day Smoker     Packs/day: 0.25    Years: 38.00    Pack years: 9.50    Types: Cigarettes   Smokeless tobacco: Never Used  Vaping Use   Vaping Use: Never used  Substance and Sexual Activity   Alcohol use: Yes    Alcohol/week: 1.0 standard drink    Types: 1 Glasses of wine per week   Drug use: No    Types: "Crack" cocaine    Comment: Recovered 2001   Sexual activity: Yes    Partners: Male  Other Topics Concern   Not on file  Social History Narrative   Not on file   Social Determinants of Health   Financial Resource Strain:    Difficulty of Paying Living Expenses: Not on file  Food Insecurity:    Worried About Programme researcher, broadcasting/film/video in the Last Year: Not on file   [employer] of Food in the Last Year: Not on file  Transportation Needs:    Lack of Transportation (Medical): Not on file   Lack of Transportation (Non-Medical): Not on file  Physical Activity:    Days of Exercise per Week: Not on file   Minutes of Exercise per Session: Not on file  Stress:    Feeling of Stress : Not on file  Social Connections:    Frequency of Communication with Friends and  Family: Not on file   Frequency of Social Gatherings with Friends and Family: Not on file   Attends Religious Services: Not on file   Active Member of Clubs or Organizations: Not on file   Attends Banker Meetings: Not on file   Marital Status: Not on file  Intimate Partner Violence:    Fear of Current or Ex-Partner: Not on file   Emotionally Abused: Not on file   Physically Abused: Not on file   Sexually Abused: Not on file    Outpatient Medications Prior to Visit  Medication Sig Dispense Refill   aspirin EC 81 MG tablet Take 1 tablet (81 mg total) by mouth daily. 90 tablet 2   atorvastatin (LIPITOR) 40 MG tablet Take 1 tablet (40 mg total) by mouth daily. 90 tablet 3   fluticasone (FLONASE) 50 MCG/ACT nasal spray Place 1 spray into both nostrils daily. 16 g 2   gabapentin (NEURONTIN) 100 MG  capsule Take 1 capsule (100 mg total) by mouth at bedtime. 100 capsule 3   ipratropium-albuterol (DUONEB) 0.5-2.5 (3) MG/3ML SOLN Take 3 mLs by nebulization every 6 (six) hours as needed. 360 mL 1   montelukast (SINGULAIR) 10 MG tablet Take 1 tablet (10 mg total) by mouth at bedtime. 30 tablet 1   albuterol (VENTOLIN HFA) 108 (90 Base) MCG/ACT inhaler Inhale 2 puffs into the lungs every 6 (six) hours as needed for wheezing or shortness of breath. 18 g 2   Fluticasone-Salmeterol (ADVAIR) 250-50 MCG/DOSE AEPB Inhale 1 puff into the lungs daily. 60 each 1   NON FORMULARY Sea moss 2 tbs daily     polyethylene glycol (GOLYTELY) 236 g solution Drink one 8 oz glass every 20 mins until entire container is finished starting at 5:00pm on 02/02/20 4000 mL 0   Facility-Administered Medications Prior to Visit  Medication Dose Route Frequency Provider Last Rate Last Admin   lidocaine (XYLOCAINE) 2 % jelly 1 application  1 application Urethral Once Sondra Come, MD        Allergies  Allergen Reactions   Adhesive [Tape] Other (See Comments)    Blisters and burning   Wool Alcohol [Lanolin] Itching    ROS Review of Systems  Constitutional: Positive for chills (intermittent chills).  Respiratory: Positive for cough (dry cough), shortness of breath and wheezing (intermitent wheezing). Negative for chest tightness.   Cardiovascular: Negative.       Objective:    Physical Exam HENT:     Head: Normocephalic and atraumatic.  Cardiovascular:     Rate and Rhythm: Normal rate and regular rhythm.     Pulses: Normal pulses.     Heart sounds: Normal heart sounds.  Pulmonary:     Effort: Pulmonary effort is normal.     Breath sounds: Normal breath sounds.  Neurological:     Mental Status: She is alert.     BP 128/84 (BP Location: Left Arm, Patient Position: Sitting)    Pulse 91    Ht 5\' 7"  (1.702 m)    Wt 236 lb (107 kg)    SpO2 95%    BMI 36.96 kg/m  Wt Readings from Last 3 Encounters:   08/26/20 236 lb (107 kg)  02/03/20 237 lb (107.5 kg)  01/28/20 245 lb (111.1 kg)   She was advised to continue on her weight loss regimen.  Health Maintenance Due  Topic Date Due   Hepatitis C Screening  Never done   TETANUS/TDAP  Never done   PAP  SMEAR-Modifier  Never done   MAMMOGRAM  Never done   INFLUENZA VACCINE  07/26/2020    There are no preventive care reminders to display for this patient.  Lab Results  Component Value Date   TSH 2.690 05/01/2019   Lab Results  Component Value Date   WBC 6.6 07/01/2019   HGB 11.3 (L) 07/01/2019   HCT 35.0 (L) 07/01/2019   MCV 89.1 07/01/2019   PLT 368 07/01/2019   Lab Results  Component Value Date   NA 141 09/26/2019   K 4.1 09/26/2019   CO2 27 09/26/2019   GLUCOSE 92 09/26/2019   BUN 11 09/26/2019   CREATININE 0.68 09/26/2019   BILITOT 0.2 05/01/2019   ALKPHOS 87 05/01/2019   AST 18 05/01/2019   ALT 13 05/01/2019   PROT 7.1 05/01/2019   ALBUMIN 4.6 05/01/2019   CALCIUM 9.5 09/26/2019   ANIONGAP 9 09/26/2019   Lab Results  Component Value Date   CHOL 176 05/01/2019   Lab Results  Component Value Date   HDL 51 05/01/2019   Lab Results  Component Value Date   LDLCALC 109 (H) 05/01/2019   Lab Results  Component Value Date   TRIG 78 05/01/2019   Lab Results  Component Value Date   CHOLHDL 3.5 05/01/2019   Lab Results  Component Value Date   HGBA1C 5.4 05/01/2019      Assessment & Plan:   1. Asthma, unspecified asthma severity, unspecified whether complicated, unspecified whether persistent - Her Asthma is under control and she will continue on current treatment regimen. - albuterol (VENTOLIN HFA) 108 (90 Base) MCG/ACT inhaler; Inhale 2 puffs into the lungs every 6 (six) hours as needed for wheezing or shortness of breath.  Dispense: 18 g; Refill: 2 - Fluticasone-Salmeterol (ADVAIR) 250-50 MCG/DOSE AEPB; Inhale 1 puff into the lungs daily.  Dispense: 60 each; Refill: 1  2. Cough - Ddx Covid  19, she had 2 doses of Covid vaccine, was advised to get tested. She will continue on Benzonatate, educated on medication side effects and was advised to notify clinic. - benzonatate (TESSALON PERLES) 100 MG capsule; Take 1 capsule (100 mg total) by mouth 3 (three) times daily as needed for cough.  Dispense: 20 capsule; Refill: 0     Follow-up: Return in about 3 weeks (around 09/16/2020), or if symptoms worsen or fail to improve.    Kindal Ponti Trellis Paganini, NP

## 2020-08-27 ENCOUNTER — Telehealth: Payer: Self-pay | Admitting: Gerontology

## 2020-08-27 NOTE — Telephone Encounter (Signed)
Called and LVM for pt. to bring in second pay stub for application on 9/2 @3 :30pm - CV

## 2020-09-13 ENCOUNTER — Emergency Department: Payer: Self-pay

## 2020-09-13 ENCOUNTER — Other Ambulatory Visit: Payer: Self-pay

## 2020-09-13 ENCOUNTER — Emergency Department
Admission: EM | Admit: 2020-09-13 | Discharge: 2020-09-13 | Disposition: A | Discharge status: Home / Self Care | Payer: Self-pay | Attending: Emergency Medicine | Admitting: Emergency Medicine

## 2020-09-13 DIAGNOSIS — Z7982 Long term (current) use of aspirin: Secondary | ICD-10-CM | POA: Insufficient documentation

## 2020-09-13 DIAGNOSIS — M25561 Pain in right knee: Secondary | ICD-10-CM | POA: Insufficient documentation

## 2020-09-13 DIAGNOSIS — I2510 Atherosclerotic heart disease of native coronary artery without angina pectoris: Secondary | ICD-10-CM | POA: Insufficient documentation

## 2020-09-13 DIAGNOSIS — F17210 Nicotine dependence, cigarettes, uncomplicated: Secondary | ICD-10-CM | POA: Insufficient documentation

## 2020-09-13 DIAGNOSIS — M25511 Pain in right shoulder: Secondary | ICD-10-CM | POA: Insufficient documentation

## 2020-09-13 DIAGNOSIS — T07XXXA Unspecified multiple injuries, initial encounter: Secondary | ICD-10-CM

## 2020-09-13 DIAGNOSIS — T148XXA Other injury of unspecified body region, initial encounter: Secondary | ICD-10-CM | POA: Insufficient documentation

## 2020-09-13 DIAGNOSIS — W19XXXA Unspecified fall, initial encounter: Secondary | ICD-10-CM | POA: Insufficient documentation

## 2020-09-13 DIAGNOSIS — R0781 Pleurodynia: Secondary | ICD-10-CM | POA: Insufficient documentation

## 2020-09-13 DIAGNOSIS — J4541 Moderate persistent asthma with (acute) exacerbation: Secondary | ICD-10-CM | POA: Insufficient documentation

## 2020-09-13 DIAGNOSIS — M25551 Pain in right hip: Secondary | ICD-10-CM | POA: Insufficient documentation

## 2020-09-13 LAB — BASIC METABOLIC PANEL
Anion gap: 8 (ref 5–15)
BUN: 9 mg/dL (ref 6–20)
CO2: 31 mmol/L (ref 22–32)
Calcium: 9.2 mg/dL (ref 8.9–10.3)
Chloride: 101 mmol/L (ref 98–111)
Creatinine, Ser: 0.75 mg/dL (ref 0.44–1.00)
GFR calc Af Amer: >60 mL/min (ref >60)
GFR calc non Af Amer: >60 mL/min (ref >60)
Glucose, Bld: 79 mg/dL (ref 70–99)
Potassium: 3.4 mmol/L — ABNORMAL LOW (ref 3.5–5.1)
Sodium: 140 mmol/L (ref 135–145)

## 2020-09-13 LAB — CBC
HCT: 33.6 % — ABNORMAL LOW (ref 36.0–46.0)
Hemoglobin: 11 g/dL — ABNORMAL LOW (ref 12.0–15.0)
MCH: 29.4 pg (ref 26.0–34.0)
MCHC: 32.7 g/dL (ref 30.0–36.0)
MCV: 89.8 fL (ref 80.0–100.0)
Platelets: 374 10*3/uL (ref 150–400)
RBC: 3.74 MIL/uL — ABNORMAL LOW (ref 3.87–5.11)
RDW: 13.2 % (ref 11.5–15.5)
WBC: 7.3 10*3/uL (ref 4.0–10.5)
nRBC: 0 % (ref 0.0–0.2)

## 2020-09-13 LAB — TROPONIN I (HIGH SENSITIVITY): Troponin I (High Sensitivity): 3 ng/L (ref <18)

## 2020-09-13 MED ORDER — IPRATROPIUM-ALBUTEROL 0.5-2.5 (3) MG/3ML IN SOLN
3.0000 mL | Freq: Once | RESPIRATORY_TRACT | Status: AC
  Administered 2020-09-13: 3 mL via RESPIRATORY_TRACT
  Filled 2020-09-13: qty 3

## 2020-09-13 MED ORDER — PSEUDOEPH-BROMPHEN-DM 30-2-10 MG/5ML PO SYRP: 10.0000 mL | ORAL_SOLUTION | Freq: Four times a day (QID) | ORAL | 0 refills | Status: DC | PRN

## 2020-09-13 MED ORDER — DEXAMETHASONE SODIUM PHOSPHATE 10 MG/ML IJ SOLN
10.0000 mg | Freq: Once | INTRAMUSCULAR | Status: AC
  Administered 2020-09-13: 10 mg via INTRAMUSCULAR
  Filled 2020-09-13: qty 1

## 2020-09-13 MED ORDER — PREDNISONE 10 MG PO TABS: 10.0000 mg | ORAL_TABLET | Freq: Every day | ORAL | 0 refills | Status: DC

## 2020-09-13 MED ORDER — BENZONATATE 100 MG PO CAPS: 100.0000 mg | ORAL_CAPSULE | Freq: Four times a day (QID) | ORAL | 0 refills | Status: AC | PRN

## 2020-09-13 NOTE — ED Triage Notes (Signed)
Patient arrived via POV from home. Patient is AOx4 and ambulatory. Patient has ben having asthma and a cough since August 11, 2020 and also fell 7 days ago. Patient is also complaining of right hip pain, right rib pain and right shoulder pain. Patient states she was tested for COVID last week and test was negative.
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
## 2020-09-17 ENCOUNTER — Other Ambulatory Visit: Payer: Self-pay

## 2020-09-17 ENCOUNTER — Ambulatory Visit: Payer: Self-pay | Admitting: Gerontology

## 2020-09-17 VITALS — BP 93/71 | HR 93 | Resp 20

## 2020-09-17 DIAGNOSIS — R6889 Other general symptoms and signs: Secondary | ICD-10-CM

## 2020-09-17 DIAGNOSIS — R059 Cough, unspecified: Secondary | ICD-10-CM

## 2020-09-17 DIAGNOSIS — J45909 Unspecified asthma, uncomplicated: Secondary | ICD-10-CM

## 2020-09-17 NOTE — Patient Instructions (Signed)

## 2020-09-17 NOTE — Progress Notes (Signed)
Established Patient Office Visit  Subjective:  Patient ID: Diana Galvan, female    DOB: [birth year]  Age: 54 y.o. MRN: [id number]  CC:  Chief Complaint  Patient presents with  . Cough  . Fever    HPI Diana Galvan presents for c/o persistent non productive cough that has been going on for one month. She was seen at the ED on 09/13/2020 for Asthma and cough. Chest X ray done showed no active cardiopulmonary disease per Dr D.Williams III. She was discharged with Prednisone taper, benzonatate and brompheniramine-pseudoephedrine-DM . She presents today c/o persistent non productive  cough,   fever with temperature of 100.67F since her ED visit, dizziness and generalized myalgia. She states that she had COVID test done 2 weeks ago in [facility] and it was negative. She was afebrile during visit and admits that she has not picked up the medicine that was prescribed during her ED visit from the Pharmacy because she couldn't find her car keys. She also requested work note and offers no further complaint.  Past Medical History:  Diagnosis Date  . Asthma   . Coronary artery disease    mild cad in mLAD on coronary CTA  . Costochondritis   . DDD (degenerative disc disease), lumbar   . Depression   . Thyroid disease   . Thyroid goiter     Past Surgical History:  Procedure Laterality Date  . APPENDECTOMY    . COLONOSCOPY WITH PROPOFOL N/A 02/03/2020   Procedure: COLONOSCOPY WITH PROPOFOL;  Surgeon: Midge Minium, MD;  Location: [facility] ENDOSCOPY;  Service: Endoscopy;  Laterality: N/A;  Priority 4  . THYROIDECTOMY    . TUBAL LIGATION    . VAGINAL HYSTERECTOMY  10/30/2002    Family History  Problem Relation Age of Onset  . Mental illness Mother   . Heart Problems Mother   . Congestive Heart Failure Paternal Aunt   . Heart failure Paternal Aunt   . Breast cancer Cousin   . Heart failure Paternal Grandmother     Social History   Socioeconomic History  . Marital status:  Divorced    Spouse name: Not on file  . Number of children: Not on file  . Years of education: Not on file  . Highest education level: Not on file  Occupational History  . Not on file  Tobacco Use  . Smoking status: Current Some Day Smoker    Packs/day: 0.25    Years: 38.00    Pack years: 9.50    Types: Cigarettes  . Smokeless tobacco: Never Used  Vaping Use  . Vaping Use: Never used  Substance and Sexual Activity  . Alcohol use: Not Currently  . Drug use: No    Types: "Crack" cocaine    Comment: Recovered 2001  . Sexual activity: Yes    Partners: Male    Birth control/protection: None  Other Topics Concern  . Not on file  Social History Narrative  . Not on file   Social Determinants of Health   Financial Resource Strain:   . Difficulty of Paying Living Expenses: Not on file  Food Insecurity:   . Worried About Programme researcher, broadcasting/film/video in the Last Year: Not on file  . Ran Out of Food in the Last Year: Not on file  Transportation Needs:   . Lack of Transportation (Medical): Not on file  . Lack of Transportation (Non-Medical): Not on file  Physical Activity:   . Days of Exercise per Week: Not on file  .  Minutes of Exercise per Session: Not on file  Stress:   . Feeling of Stress : Not on file  Social Connections:   . Frequency of Communication with Friends and Family: Not on file  . Frequency of Social Gatherings with Friends and Family: Not on file  . Attends Religious Services: Not on file  . Active Member of Clubs or Organizations: Not on file  . Attends Banker Meetings: Not on file  . Marital Status: Not on file  Intimate Partner Violence:   . Fear of Current or Ex-Partner: Not on file  . Emotionally Abused: Not on file  . Physically Abused: Not on file  . Sexually Abused: Not on file    Outpatient Medications Prior to Visit  Medication Sig Dispense Refill  . albuterol (VENTOLIN HFA) 108 (90 Base) MCG/ACT inhaler Inhale 2 puffs into the lungs every  6 (six) hours as needed for wheezing or shortness of breath. 18 g 2  . aspirin EC 81 MG tablet Take 1 tablet (81 mg total) by mouth daily. 90 tablet 2  . atorvastatin (LIPITOR) 40 MG tablet Take 1 tablet (40 mg total) by mouth daily. 90 tablet 3  . benzonatate (TESSALON PERLES) 100 MG capsule Take 1 capsule (100 mg total) by mouth every 6 (six) hours as needed. 30 capsule 0  . brompheniramine-pseudoephedrine-DM 30-2-10 MG/5ML syrup Take 10 mLs by mouth 4 (four) times daily as needed. 200 mL 0  . fluticasone (FLONASE) 50 MCG/ACT nasal spray Place 1 spray into both nostrils daily. 16 g 2  . Fluticasone-Salmeterol (ADVAIR) 250-50 MCG/DOSE AEPB Inhale 1 puff into the lungs daily. 60 each 1  . gabapentin (NEURONTIN) 100 MG capsule Take 1 capsule (100 mg total) by mouth at bedtime. 100 capsule 3  . ipratropium-albuterol (DUONEB) 0.5-2.5 (3) MG/3ML SOLN Take 3 mLs by nebulization every 6 (six) hours as needed. 360 mL 1  . montelukast (SINGULAIR) 10 MG tablet Take 1 tablet (10 mg total) by mouth at bedtime. 30 tablet 1  . NON FORMULARY Sea moss 2 tbs daily    . polyethylene glycol (GOLYTELY) 236 g solution Drink one 8 oz glass every 20 mins until entire container is finished starting at 5:00pm on 02/02/20 4000 mL 0  . predniSONE (DELTASONE) 10 MG tablet Take 1 tablet (10 mg total) by mouth daily. 42 tablet 0   Facility-Administered Medications Prior to Visit  Medication Dose Route Frequency Provider Last Rate Last Admin  . lidocaine (XYLOCAINE) 2 % jelly 1 application  1 application Urethral Once Sondra Come, MD        Allergies  Allergen Reactions  . Adhesive [Tape] Other (See Comments)    Blisters and burning  . Wool Alcohol [Lanolin] Itching    ROS Review of Systems  Constitutional: Positive for chills, fatigue and fever.  HENT: Positive for rhinorrhea.   Respiratory: Positive for cough.   Cardiovascular: Negative.   Neurological: Positive for dizziness.      Objective:     Physical Exam HENT:     Head: Normocephalic and atraumatic.  Cardiovascular:     Rate and Rhythm: Normal rate and regular rhythm.     Pulses: Normal pulses.     Heart sounds: Normal heart sounds.  Pulmonary:     Effort: Pulmonary effort is normal.     Breath sounds: Normal breath sounds.  Neurological:     General: No focal deficit present.     Mental Status: She is alert and oriented to  person, place, and time. Mental status is at baseline.  Psychiatric:        Mood and Affect: Mood normal.        Behavior: Behavior normal.        Thought Content: Thought content normal.        Judgment: Judgment normal.     BP 93/71 (BP Location: Left Arm, Patient Position: Sitting, Cuff Size: Large)   Pulse 93   Resp 20   SpO2 95%  Wt Readings from Last 3 Encounters:  09/13/20 236 lb (107 kg)  08/26/20 236 lb (107 kg)  02/03/20 237 lb (107.5 kg)     Health Maintenance Due  Topic Date Due  . Hepatitis C Screening  Never done  . TETANUS/TDAP  Never done  . PAP SMEAR-Modifier  Never done  . MAMMOGRAM  Never done  . INFLUENZA VACCINE  07/26/2020    There are no preventive care reminders to display for this patient.  Lab Results  Component Value Date   TSH 2.690 05/01/2019   Lab Results  Component Value Date   WBC 7.3 09/13/2020   HGB 11.0 (L) 09/13/2020   HCT 33.6 (L) 09/13/2020   MCV 89.8 09/13/2020   PLT 374 09/13/2020   Lab Results  Component Value Date   NA 140 09/13/2020   K 3.4 (L) 09/13/2020   CO2 31 09/13/2020   GLUCOSE 79 09/13/2020   BUN 9 09/13/2020   CREATININE 0.75 09/13/2020   BILITOT 0.2 05/01/2019   ALKPHOS 87 05/01/2019   AST 18 05/01/2019   ALT 13 05/01/2019   PROT 7.1 05/01/2019   ALBUMIN 4.6 05/01/2019   CALCIUM 9.2 09/13/2020   ANIONGAP 8 09/13/2020   Lab Results  Component Value Date   CHOL 176 05/01/2019   Lab Results  Component Value Date   HDL 51 05/01/2019   Lab Results  Component Value Date   LDLCALC 109 (H) 05/01/2019    Lab Results  Component Value Date   TRIG 78 05/01/2019   Lab Results  Component Value Date   CHOLHDL 3.5 05/01/2019   Lab Results  Component Value Date   HGBA1C 5.4 05/01/2019      Assessment & Plan:   1. Cough - She was advised to schedule Covid and Influenza test. She was strongly advised to pick up medicine prescribed during her ED visit from the Pharmacy.  2. Flu-like symptoms -Same as # 1. She was provided with work permit.  3. Asthma, unspecified asthma severity, unspecified whether complicated, unspecified whether persistent  - She was advised to complete [facility] financial application for Ambulatory referral to Pulmonology     Follow-up: Return in about 20 days (around 10/07/2020), or if symptoms worsen or fail to improve.    Legacy Lacivita Trellis Paganini, NP

## 2020-10-07 ENCOUNTER — Ambulatory Visit: Payer: Self-pay | Admitting: Gerontology

## 2020-10-13 ENCOUNTER — Telehealth: Payer: Self-pay | Admitting: Gerontology

## 2020-10-29 ENCOUNTER — Institutional Professional Consult (permissible substitution): Payer: Self-pay | Admitting: Pulmonary Disease

## 2020-11-18 ENCOUNTER — Ambulatory Visit (LOCAL_COMMUNITY_HEALTH_CENTER): Payer: Self-pay

## 2020-11-18 ENCOUNTER — Other Ambulatory Visit: Payer: Self-pay

## 2020-11-18 DIAGNOSIS — Z23 Encounter for immunization: Secondary | ICD-10-CM

## 2020-12-07 ENCOUNTER — Institutional Professional Consult (permissible substitution): Payer: Self-pay | Admitting: Pulmonary Disease

## 2021-01-08 ENCOUNTER — Other Ambulatory Visit: Payer: Self-pay

## 2021-01-08 ENCOUNTER — Ambulatory Visit (INDEPENDENT_AMBULATORY_CARE_PROVIDER_SITE_OTHER): Payer: BLUE CROSS/BLUE SHIELD | Admitting: Cardiology

## 2021-01-08 ENCOUNTER — Encounter: Payer: Self-pay | Admitting: Cardiology

## 2021-01-08 VITALS — BP 132/84 | HR 81 | Ht 67.0 in | Wt 242.0 lb

## 2021-01-08 DIAGNOSIS — E7800 Pure hypercholesterolemia, unspecified: Secondary | ICD-10-CM | POA: Diagnosis not present

## 2021-01-08 DIAGNOSIS — I2510 Atherosclerotic heart disease of native coronary artery without angina pectoris: Secondary | ICD-10-CM | POA: Diagnosis not present

## 2021-01-08 DIAGNOSIS — F17200 Nicotine dependence, unspecified, uncomplicated: Secondary | ICD-10-CM

## 2021-01-08 MED ORDER — ASPIRIN EC 81 MG PO TBEC
81.0000 mg | DELAYED_RELEASE_TABLET | Freq: Every day | ORAL | 3 refills | Status: DC
Start: 2021-01-08 — End: 2023-01-03

## 2021-01-08 MED ORDER — ATORVASTATIN CALCIUM 40 MG PO TABS: 40.0000 mg | ORAL_TABLET | Freq: Every day | ORAL | 3 refills | Status: DC

## 2021-01-08 NOTE — Patient Instructions (Addendum)
Medication Instructions:  Your physician recommends that you continue on your current medications as directed. Please refer to the Current Medication list given to you today.  Atorvastatin and Aspirin have been refilled.  *If you need a refill on your cardiac medications before your next appointment, please call your pharmacy*   Lab Work: Your physician recommends that you return for a FASTING lipid profile:   Please have your lab drawn at the [facility] medical mall lab. You do not need an appt. Lab hours are Mon-Fri 7am-6pm.  If you have labs (blood work) drawn today and your tests are completely normal, you will receive your results only by: Marland Kitchen MyChart Message (if you have MyChart) OR . A paper copy in the mail If you have any lab test that is abnormal or we need to change your treatment, we will call you to review the results.   Testing/Procedures: None ordered   Follow-Up: At [facility], you and your health needs are our priority.  As part of our continuing mission to provide you with exceptional heart care, we have created designated Provider Care Teams.  These Care Teams include your primary Cardiologist (physician) and Advanced Practice Providers (APPs -  Physician Assistants and Nurse Practitioners) who all work together to provide you with the care you need, when you need it.  We recommend signing up for the patient portal called "MyChart".  Sign up information is provided on this After Visit Summary.  MyChart is used to connect with patients for Virtual Visits (Telemedicine).  Patients are able to view lab/test results, encounter notes, upcoming appointments, etc.  Non-urgent messages can be sent to your provider as well.   To learn more about what you can do with MyChart, go to [email or web address].    Your next appointment:   Your physician wants you to follow-up in: 1 year You will receive a reminder letter in the mail two months in advance. If you don't receive a letter,  please call our office to schedule the follow-up appointment.   The format for your next appointment:   In Person  Provider:   Debbe Odea, MD   Other Instructions N/A

## 2021-01-08 NOTE — Progress Notes (Signed)
Cardiology Office Note:    Date:  01/08/2021   [facility] [birth year], MRN [id number]  PCP:  Rolm Gala, NP  Cardiologist:  Debbe Odea, MD  Electrophysiologist:  None   Referring MD: Rolm Gala, NP   Chief Complaint  Patient presents with  . Annual Exam    Pt states three fingers on her right hand are numb. Also states she had a bruise on her right inner thigh, went away a couple of days ago---states she had a blood clot in 2005 on left ankle. She states when she went to ED, they gave her a steroid shot and afterwards had  severe allergic reaction. She states since then, the back of her right leg and top of the thigh feels hot, like a burning sensation. She is also c/o swelling in both feet and hands.    History of Present Illness:    Diana Galvan is a 55 y.o. female with a hx of asthma, nonobstructive CAD (mild mid LAD disease CCTA 2020) current smoker for over 30 years, who presents for follow-up.    Previously seen for chest pain, echo and coronary CTA showed nonobstructive disease, echo was normal.  Presents for yearly follow-up.  Tolerating aspirin and Lipitor 40 mg.  Has no adverse effects.  She has been doing well over the past year.  Denies any symptoms of chest pain.  Has issues with asthma exacerbation.  Has appointment to see pulmonary medicine in the coming weeks.   Prior notes Echocardiogram 10/2019, normal systolic and diastolic function, EF 55 to 60%. Coronary CTA 09/2019 calcium score 0, mild noncalcified mid LAD disease.  Past Medical History:  Diagnosis Date  . Asthma   . Coronary artery disease    mild cad in mLAD on coronary CTA  . Costochondritis   . DDD (degenerative disc disease), lumbar   . Depression   . Thyroid disease   . Thyroid goiter     Past Surgical History:  Procedure Laterality Date  . APPENDECTOMY    . COLONOSCOPY WITH PROPOFOL N/A 02/03/2020   Procedure: COLONOSCOPY WITH PROPOFOL;   Surgeon: Midge Minium, MD;  Location[facility] ENDOSCOPY;  Service: Endoscopy;  Laterality: N/A;  Priority 4  . THYROIDECTOMY    . TUBAL LIGATION    . VAGINAL HYSTERECTOMY  10/30/2002    Current Medications: Current Meds  Medication Sig  . albuterol (VENTOLIN HFA) 108 (90 Base) MCG/ACT inhaler Inhale 2 puffs into the lungs every 6 (six) hours as needed for wheezing or shortness of breath.  . benzonatate (TESSALON PERLES) 100 MG capsule Take 1 capsule (100 mg total) by mouth every 6 (six) hours as needed.  . fluticasone (FLONASE) 50 MCG/ACT nasal spray Place 1 spray into both nostrils daily.  . Fluticasone-Salmeterol (ADVAIR) 250-50 MCG/DOSE AEPB Inhale 1 puff into the lungs daily.  Marland Kitchen ipratropium-albuterol (DUONEB) 0.5-2.5 (3) MG/3ML SOLN Take 3 mLs by nebulization every 6 (six) hours as needed.  . montelukast (SINGULAIR) 10 MG tablet Take 1 tablet (10 mg total) by mouth at bedtime.  . [DISCONTINUED] aspirin EC 81 MG tablet Take 1 tablet (81 mg total) by mouth daily.   Current Facility-Administered Medications for the 01/08/21 encounter (Office Visit) with Debbe Odea, MD  Medication  . lidocaine (XYLOCAINE) 2 % jelly 1 application     Allergies:   Adhesive [tape] and Wool alcohol [lanolin]   Social History   Socioeconomic History  . Marital status: Divorced    Spouse name:  Not on file  . Number of children: Not on file  . Years of education: Not on file  . Highest education level: Not on file  Occupational History  . Not on file  Tobacco Use  . Smoking status: Current Some Day Smoker    Packs/day: 0.25    Years: 38.00    Pack years: 9.50    Types: Cigarettes  . Smokeless tobacco: Never Used  Vaping Use  . Vaping Use: Never used  Substance and Sexual Activity  . Alcohol use: Not Currently  . Drug use: No    Types: "Crack" cocaine    Comment: Recovered 2001  . Sexual activity: Yes    Partners: Male    Birth control/protection: None  Other Topics Concern  . Not on  file  Social History Narrative  . Not on file   Social Determinants of Health   Financial Resource Strain: Not on file  Food Insecurity: Not on file  Transportation Needs: Not on file  Physical Activity: Not on file  Stress: Not on file  Social Connections: Not on file     Family History: The patient's family history includes Breast cancer in her cousin; Congestive Heart Failure in her paternal aunt; Heart Problems in her mother; Heart failure in her paternal aunt and paternal grandmother; Mental illness in her mother.  ROS:   Please see the history of present illness.     All other systems reviewed and are negative.  EKGs/Labs/Other Studies Reviewed:    The following studies were reviewed today:  TTE 10/31/2019 1. Left ventricular ejection fraction, by visual estimation, is 55 to 60%. The left ventricle has normal function. There is no left ventricular hypertrophy.  2. Global right ventricle has normal systolic function.The right ventricular size is normal. No increase in right ventricular wall thickness.  3. Left atrial size was normal.  4. TR signal is inadequate for assessing pulmonary artery systolic pressure.  Coronary CTA date 10/02/2019 IMPRESSION: 1. Coronary calcium score of 0.  2. Normal coronary origin with left dominance.  3. Mild (25-49%) mLAD non-calcified stenosis vs artifact as described above.  4. Mid LAD myocardial bridge (normal variant).  5. No evidence of obstructive CAD.  RECOMMENDATIONS: 1. Mild non-obstructive CAD (25-49%). Consider non-atherosclerotic causes of chest pain. Consider preventive therapy and risk factor modification.  EKG:  EKG is  ordered today.  The ekg ordered today demonstrates normal sinus rhythm, first-degree AV block.  Recent Labs: 09/13/2020: BUN 9; Creatinine, Ser 0.75; Hemoglobin 11.0; Platelets 374; Potassium 3.4; Sodium 140  Recent Lipid Panel    Component Value Date/Time   CHOL 176 05/01/2019 1049   TRIG  78 05/01/2019 1049   HDL 51 05/01/2019 1049   CHOLHDL 3.5 05/01/2019 1049   LDLCALC 109 (H) 05/01/2019 1049    Physical Exam:    VS:  BP 132/84   Pulse 81   Ht 5\' 7"  (1.702 m)   Wt 242 lb (109.8 kg)   BMI 37.90 kg/m     Wt Readings from Last 3 Encounters:  01/08/21 242 lb (109.8 kg)  09/13/20 236 lb (107 kg)  08/26/20 236 lb (107 kg)     GEN:  Well nourished, well developed in no acute distress, obese HEENT: Normal NECK: No JVD; No carotid bruits LYMPHATICS: No lymphadenopathy CARDIAC: RRR, no murmurs, rubs, gallops RESPIRATORY:  Clear to auscultation without rales, wheezing or rhonchi  ABDOMEN: Soft, non-tender, non-distended MUSCULOSKELETAL:  No edema; No deformity  SKIN: Warm and dry NEUROLOGIC:  Alert and oriented x 3 PSYCHIATRIC:  Normal affect   ASSESSMENT:    1. Coronary artery disease involving native coronary artery of native heart without angina pectoris   2. Smoking   3. Pure hypercholesterolemia      PLAN:    In order of problems listed above:  1. Nonobstructive CAD, mild noncalcified plaque in mid LAD.  Denies chest pain.  Continue aspirin, Lipitor.  Repeat fasting lipid profile.  Goal LDL less than 70.  We will call patient if titration of statin is needed. 2. Current smoker smoking cessation advised. 3. Hyperlipidemia, LDL goal less than 70, check fasting lipid profile.  Continue Lipitor as prescribed.  Follow-up in 1 year   Medication Adjustments/Labs and Tests Ordered: Current medicines are reviewed at length with the patient today.  Concerns regarding medicines are outlined above.  Orders Placed This Encounter  Procedures  . Lipid panel   Meds ordered this encounter  Medications  . atorvastatin (LIPITOR) 40 MG tablet    Sig: Take 1 tablet (40 mg total) by mouth daily.    Dispense:  90 tablet    Refill:  3  . aspirin EC 81 MG tablet    Sig: Take 1 tablet (81 mg total) by mouth daily.    Dispense:  90 tablet    Refill:  3    Patient  Instructions  Medication Instructions:  Your physician recommends that you continue on your current medications as directed. Please refer to the Current Medication list given to you today.  Atorvastatin and Aspirin have been refilled.  *If you need a refill on your cardiac medications before your next appointment, please call your pharmacy*   Lab Work: Your physician recommends that you return for a FASTING lipid profile:   Please have your lab drawn at the [facility] medical mall lab. You do not need an appt. Lab hours are Mon-Fri 7am-6pm.  If you have labs (blood work) drawn today and your tests are completely normal, you will receive your results only by: Marland Kitchen MyChart Message (if you have MyChart) OR . A paper copy in the mail If you have any lab test that is abnormal or we need to change your treatment, we will call you to review the results.   Testing/Procedures: None ordered   Follow-Up: At [facility], you and your health needs are our priority.  As part of our continuing mission to provide you with exceptional heart care, we have created designated Provider Care Teams.  These Care Teams include your primary Cardiologist (physician) and Advanced Practice Providers (APPs -  Physician Assistants and Nurse Practitioners) who all work together to provide you with the care you need, when you need it.  We recommend signing up for the patient portal called "MyChart".  Sign up information is provided on this After Visit Summary.  MyChart is used to connect with patients for Virtual Visits (Telemedicine).  Patients are able to view lab/test results, encounter notes, upcoming appointments, etc.  Non-urgent messages can be sent to your provider as well.   To learn more about what you can do with MyChart, go to [email or web address].    Your next appointment:   Your physician wants you to follow-up in: 1 year You will receive a reminder letter in the mail two months in advance. If you don't  receive a letter, please call our office to schedule the follow-up appointment.   The format for your next appointment:   In Person  Provider:   Arlys John  Azucena Cecil, MD   Other Instructions N/A      Signed, Debbe Odea, MD  01/08/2021 4:55 PM    [facility]

## 2021-01-11 ENCOUNTER — Institutional Professional Consult (permissible substitution): Payer: Self-pay | Admitting: Pulmonary Disease

## 2021-01-18 ENCOUNTER — Encounter (INDEPENDENT_AMBULATORY_CARE_PROVIDER_SITE_OTHER): Payer: Self-pay

## 2021-03-25 ENCOUNTER — Telehealth: Payer: Self-pay | Admitting: Pharmacist

## 2021-03-25 NOTE — Telephone Encounter (Signed)
Patient has prescription drug coverage no longer meets the eligibility requirements to obtain medication assistance from [facility]. Patient notified by letter.  [facility] Administrative Assistant 

## 2022-01-15 ENCOUNTER — Emergency Department
Admission: EM | Admit: 2022-01-15 | Discharge: 2022-01-16 | Disposition: A | Discharge status: Home / Self Care | Payer: 59 | Attending: Emergency Medicine | Admitting: Emergency Medicine

## 2022-01-15 ENCOUNTER — Emergency Department: Payer: 59

## 2022-01-15 ENCOUNTER — Other Ambulatory Visit: Payer: Self-pay

## 2022-01-15 DIAGNOSIS — Z7951 Long term (current) use of inhaled steroids: Secondary | ICD-10-CM | POA: Diagnosis not present

## 2022-01-15 DIAGNOSIS — M25551 Pain in right hip: Secondary | ICD-10-CM | POA: Diagnosis not present

## 2022-01-15 DIAGNOSIS — Z7982 Long term (current) use of aspirin: Secondary | ICD-10-CM | POA: Insufficient documentation

## 2022-01-15 DIAGNOSIS — I2510 Atherosclerotic heart disease of native coronary artery without angina pectoris: Secondary | ICD-10-CM | POA: Diagnosis not present

## 2022-01-15 DIAGNOSIS — R0781 Pleurodynia: Secondary | ICD-10-CM | POA: Diagnosis not present

## 2022-01-15 DIAGNOSIS — M25511 Pain in right shoulder: Secondary | ICD-10-CM | POA: Insufficient documentation

## 2022-01-15 DIAGNOSIS — M542 Cervicalgia: Secondary | ICD-10-CM | POA: Diagnosis not present

## 2022-01-15 DIAGNOSIS — R0789 Other chest pain: Secondary | ICD-10-CM | POA: Insufficient documentation

## 2022-01-15 DIAGNOSIS — J45909 Unspecified asthma, uncomplicated: Secondary | ICD-10-CM | POA: Insufficient documentation

## 2022-01-15 DIAGNOSIS — R202 Paresthesia of skin: Secondary | ICD-10-CM | POA: Insufficient documentation

## 2022-01-15 LAB — BASIC METABOLIC PANEL
Anion gap: 9 (ref 5–15)
BUN: 10 mg/dL (ref 6–20)
CO2: 29 mmol/L (ref 22–32)
Calcium: 9.8 mg/dL (ref 8.9–10.3)
Chloride: 104 mmol/L (ref 98–111)
Creatinine, Ser: 0.77 mg/dL (ref 0.44–1.00)
GFR, Estimated: >60 mL/min (ref >60)
Glucose, Bld: 107 mg/dL — ABNORMAL HIGH (ref 70–99)
Potassium: 3.5 mmol/L (ref 3.5–5.1)
Sodium: 142 mmol/L (ref 135–145)

## 2022-01-15 LAB — CBC
HCT: 36.6 % (ref 36.0–46.0)
Hemoglobin: 11.7 g/dL — ABNORMAL LOW (ref 12.0–15.0)
MCH: 28.6 pg (ref 26.0–34.0)
MCHC: 32 g/dL (ref 30.0–36.0)
MCV: 89.5 fL (ref 80.0–100.0)
Platelets: 428 10*3/uL — ABNORMAL HIGH (ref 150–400)
RBC: 4.09 MIL/uL (ref 3.87–5.11)
RDW: 13.1 % (ref 11.5–15.5)
WBC: 6 10*3/uL (ref 4.0–10.5)
nRBC: 0 % (ref 0.0–0.2)

## 2022-01-15 LAB — TROPONIN I (HIGH SENSITIVITY): Troponin I (High Sensitivity): 4 ng/L (ref <18)
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
## 2022-01-15 NOTE — ED Triage Notes (Signed)
Pt presents to ER c/o pain to neck, right shoulder, right side of rib cage, and right hip after a fall in Oct/Nov last year.  Pt states pain is worse with movement and is sharp and intermittent in nature but states is has become more common now and is worse when walking.  Pt in NAD at this time.

## 2022-01-16 ENCOUNTER — Encounter: Payer: Self-pay | Admitting: Cardiology

## 2022-01-16 ENCOUNTER — Emergency Department: Payer: 59

## 2022-01-16 ENCOUNTER — Encounter: Payer: Self-pay | Admitting: Radiology

## 2022-01-16 MED ORDER — NAPROXEN 500 MG PO TABS: 500.0000 mg | ORAL_TABLET | Freq: Two times a day (BID) | ORAL | 0 refills | Status: DC

## 2022-01-16 MED ORDER — HYDROCODONE-ACETAMINOPHEN 5-325 MG PO TABS: 1.0000 | ORAL_TABLET | Freq: Four times a day (QID) | ORAL | 0 refills | Status: DC | PRN

## 2022-01-16 MED ORDER — LIDOCAINE 5 % EX PTCH
1.0000 | MEDICATED_PATCH | CUTANEOUS | Status: DC
  Administered 2022-01-16: 1 via TRANSDERMAL
  Filled 2022-01-16: qty 1

## 2022-01-16 MED ORDER — LIDOCAINE 5 % EX PTCH: 1.0000 | MEDICATED_PATCH | CUTANEOUS | 0 refills | Status: DC

## 2022-01-16 MED ORDER — KETOROLAC TROMETHAMINE 60 MG/2ML IM SOLN
30.0000 mg | Freq: Once | INTRAMUSCULAR | Status: AC
  Administered 2022-01-16: 30 mg via INTRAMUSCULAR
  Filled 2022-01-16: qty 2

## 2022-01-17 LAB — HEMOGLOBIN A1C
Hgb A1c MFr Bld: 5.5 % (ref 4.8–5.6)
Mean Plasma Glucose: 111 mg/dL

## 2022-02-14 ENCOUNTER — Encounter: Payer: Self-pay | Admitting: Cardiology

## 2022-02-14 ENCOUNTER — Ambulatory Visit: Payer: 59 | Admitting: Cardiology

## 2022-02-14 ENCOUNTER — Other Ambulatory Visit: Payer: Self-pay

## 2022-02-14 VITALS — BP 110/60 | HR 92 | Ht 67.0 in | Wt 226.0 lb

## 2022-02-14 DIAGNOSIS — F17200 Nicotine dependence, unspecified, uncomplicated: Secondary | ICD-10-CM

## 2022-02-14 DIAGNOSIS — E7800 Pure hypercholesterolemia, unspecified: Secondary | ICD-10-CM | POA: Diagnosis not present

## 2022-02-14 DIAGNOSIS — R072 Precordial pain: Secondary | ICD-10-CM | POA: Diagnosis not present

## 2022-02-14 DIAGNOSIS — Z01812 Encounter for preprocedural laboratory examination: Secondary | ICD-10-CM

## 2022-02-14 DIAGNOSIS — I2510 Atherosclerotic heart disease of native coronary artery without angina pectoris: Secondary | ICD-10-CM | POA: Diagnosis not present

## 2022-02-14 MED ORDER — PANTOPRAZOLE SODIUM 40 MG PO TBEC: 40.0000 mg | DELAYED_RELEASE_TABLET | Freq: Every day | ORAL | 6 refills | Status: DC

## 2022-02-14 MED ORDER — IVABRADINE HCL 7.5 MG PO TABS: ORAL_TABLET | ORAL | 0 refills | Status: DC

## 2022-02-14 MED ORDER — METOPROLOL TARTRATE 100 MG PO TABS: ORAL_TABLET | ORAL | 0 refills | Status: DC

## 2022-02-14 NOTE — Patient Instructions (Addendum)
Medication Instructions:  - Your physician has recommended you make the following change in your medication:   1) HOLD lipitor (atorvastatin)  2) START protonix (pantoprazole) 40 mg: - take 1 tablet by mouth once daily  *If you need a refill on your cardiac medications before your next appointment, please call your pharmacy*   Lab Work: - Your physician recommends that you return FASTING for lab work this week: Lipid/ BMP  If you have labs (blood work) drawn today and your tests are completely normal, you will receive your results only by: MyChart Message (if you have MyChart) OR A paper copy in the mail If you have any lab test that is abnormal or we need to change your treatment, we will call you to review the results.   Testing/Procedures: 1) Cardiac CT angiogram:  - Your physician has requested that you have cardiac CT. Cardiac computed tomography (CT) is a painless test that uses an x-ray machine to take clear, detailed pictures of your heart.     Your cardiac CT will be scheduled at:   [facility] [address] [place], [place] [address] [phone]  Please arrive 15 mins early for check-in and test prep.  Please follow these instructions carefully (unless otherwise directed):   On the Night Before the Test: Be sure to Drink plenty of water. Do not consume any caffeinated/decaffeinated beverages or chocolate 12 hours prior to your test. Do not take any antihistamines 12 hours prior to your test.   On the Day of the Test: Drink plenty of water until 1 hour prior to the test. Do not eat any food 4 hours prior to the test. You may take your regular medications prior to the test.  Take metoprolol (Lopressor) 100 mg two hours prior to test x 1 dose. Take corlanor 7.5 mg- 2 tablets (15 mg) two hours prior to your test x 1 dose ($18.20 approx) at [employer] for a cash pay price FEMALES- please wear underwire-free bra if  available, avoid dresses & tight clothing       After the Test: Drink plenty of water. After receiving IV contrast, you may experience a mild flushed feeling. This is normal. On occasion, you may experience a mild rash up to 24 hours after the test. This is not dangerous. If this occurs, you can take Benadryl 25 mg and increase your fluid intake. If you experience trouble breathing, this can be serious. If it is severe call 911 IMMEDIATELY. If it is mild, please call our office.   We will call to schedule your test understanding that some insurance companies will need an authorization prior to the service being performed.   For non-scheduling related questions, please contact the cardiac imaging nurse navigator should you have any questions/concerns: Rockwell Alexandria, Cardiac Imaging Nurse Navigator Larey Brick, Cardiac Imaging Nurse Navigator [facility] Direct Office Dial: [phone]   For scheduling needs, including cancellations and rescheduling, please call [place], [phone].     Follow-Up: At [facility], you and your health needs are our priority.  As part of our continuing mission to provide you with exceptional heart care, we have created designated Provider Care Teams.  These Care Teams include your primary Cardiologist (physician) and Advanced Practice Providers (APPs -  Physician Assistants and Nurse Practitioners) who all work together to provide you with the care you need, when you need it.  We recommend signing up for the patient portal called "MyChart".  Sign up information is provided on this After Visit Summary.  MyChart is used to connect with patients for Virtual Visits (Telemedicine).  Patients are able to view lab/test results, encounter notes, upcoming appointments, etc.  Non-urgent messages can be sent to your provider as well.   To learn more about what you can do with MyChart, go to [email or web address].    Your next  appointment:   4-5 weeks  The format for your next appointment:   In Person  Provider:   Debbe OdeaBrian Agbor-Etang, MD (only)   Other Instructions   PROTONIX (Pantoprazole) Tablets What is this medication? PANTOPRAZOLE (pan TOE pra zole) treats heartburn, stomach ulcers, reflux disease, or other conditions that cause too much stomach acid. It works by reducing the amount of acid in the stomach. It belongs to a group of medications called PPIs. This medicine may be used for other purposes; ask your health care provider or pharmacist if you have questions. COMMON BRAND NAME(S): Protonix What should I tell my care team before I take this medication? They need to know if you have any of these conditions: Liver disease Low levels of calcium, magnesium, or potassium in the blood Lupus An unusual or allergic reaction to pantoprazole, other medication, foods, dyes, or preservatives Pregnant or trying to get pregnant Breast-feeding How should I use this medication? Take this medication by mouth. Swallow the tablets whole with a drink of water. Follow the directions on the prescription label. Do not crush, break, or chew. Take your medication at regular intervals. Do not take your medication more often than directed. A special MedGuide will be given to you by the pharmacist with each prescription and refill. Be sure to read this information carefully each time. Talk to your care team about the use of this medication in children. While this medication may be prescribed for children as young as 5 years for selected conditions, precautions do apply. Overdosage: If you think you have taken too much of this medicine contact a poison control center or emergency room at once. NOTE: This medicine is only for you. Do not share this medicine with others. What if I miss a dose? If you miss a dose, take it as soon as you can. If it is almost time for your next dose, take only that dose. Do not take double or  extra doses. What may interact with this medication? Do not take this medication with any of the following: Atazanavir Nelfinavir This medication may also interact with the following: Ampicillin Delavirdine Erlotinib Iron salts Medications for fungal infections like ketoconazole, itraconazole and voriconazole Methotrexate Mycophenolate mofetil Warfarin This list may not describe all possible interactions. Give your health care provider a list of all the medicines, herbs, non-prescription drugs, or dietary supplements you use. Also tell them if you smoke, drink alcohol, or use illegal drugs. Some items may interact with your medicine. What should I watch for while using this medication? It can take several days before your stomach pain gets better. Check with your care team if your condition does not start to get better, or if it gets worse. Do not treat diarrhea with over the counter products. Contact your care team if you have diarrhea that lasts more than 2 days or if it is severe and watery. You may need blood work done while you are taking this medication. Using this medication for a long time may weaken your bones. The risk of bone fractures may be increased. Talk to your care team about your  bone health. Using this medication for a long time may cause growths (polyps) in the stomach. They usually don't cause any symptoms. They are usually not cancerous. Contact your care team if you notice pain or tenderness when you press your stomach, have nausea, or see bloody or black, tar-like stools. This medication may cause a decrease in vitamin B12. You should make sure that you get enough vitamin B12 while you are taking this medication. Discuss the foods you eat and the vitamins you take with your care team. What side effects may I notice from receiving this medication? Side effects that you should report to your care team as soon as possible: Allergic reactions--skin rash, itching, hives,  swelling of the face, lips, tongue, or throat Kidney injury--decrease in the amount of urine, swelling of the ankles, hands, or feet Low magnesium level--muscle pain or cramps, unusual weakness or fatigue, fast or irregular heartbeat, tremors Low vitamin B12 level--pain, tingling, or numbness in the hands or feet, muscle weakness, dizziness, confusion, difficulty concentrating Rash on the cheeks or ams that gets worse in the sun Redness, blistering, peeling, or loosening of the skin, including inside the mouth Severe diarrhea, fever Unusual bruising or bleeding Side effects that usually do not require medical attention (report to your care team if they continue or are bothersome): Diarrhea Headache Vomiting This list may not describe all possible side effects. Call your doctor for medical advice about side effects. You may report side effects to FDA at 1-800-FDA-1088. Where should I keep my medication? Keep out of the reach of children and pets. Store at room temperature between 15 and 30 degrees C (59 and 86 degrees F). Protect from light and moisture. Throw away any unused medication after the expiration date. NOTE: This sheet is a summary. It may not cover all possible information. If you have questions about this medicine, talk to your doctor, pharmacist, or health care provider.  2022 Elsevier/Gold Standard (2021-03-09 00:00:00)  Cardiac CT Angiogram A cardiac CT angiogram is a procedure to look at the heart and the area around the heart. It may be done to help find the cause of chest pains or other symptoms of heart disease. During this procedure, a substance called contrast dye is injected into the blood vessels in the area to be checked. A large X-ray machine, called a CT scanner, then takes detailed pictures of the heart and the surrounding area. The procedure is also sometimes called a coronary CT angiogram, coronary artery scanning, or CTA. A cardiac CT angiogram allows the health  care provider to see how well blood is flowing to and from the heart. The health care provider will be able to see if there are any problems, such as: Blockage or narrowing of the coronary arteries in the heart. Fluid around the heart. Signs of weakness or disease in the muscles, valves, and tissues of the heart. Tell a health care provider about: Any allergies you have. This is especially important if you have had a previous allergic reaction to contrast dye. All medicines you are taking, including vitamins, herbs, eye drops, creams, and over-the-counter medicines. Any blood disorders you have. Any surgeries you have had. Any medical conditions you have. Whether you are pregnant or may be pregnant. Any anxiety disorders, chronic pain, or other conditions you have that may increase your stress or prevent you from lying still. What are the risks? Generally, this is a safe procedure. However, problems may occur, including: Bleeding. Infection. Allergic reactions to medicines or  dyes. Damage to other structures or organs. Kidney damage from the contrast dye that is used. Increased risk of cancer from radiation exposure. This risk is low. Talk with your health care provider about: The risks and benefits of testing. How you can receive the lowest dose of radiation. What happens before the procedure? Wear comfortable clothing and remove any jewelry, glasses, dentures, and hearing aids. Follow instructions from your health care provider about eating and drinking. This may include: For 12 hours before the procedure -- avoid caffeine. This includes tea, coffee, soda, energy drinks, and diet pills. Drink plenty of water or other fluids that do not have caffeine in them. Being well hydrated can prevent complications. For 4-6 hours before the procedure -- stop eating and drinking. The contrast dye can cause nausea, but this is less likely if your stomach is empty. Ask your health care provider about  changing or stopping your regular medicines. This is especially important if you are taking diabetes medicines, blood thinners, or medicines to treat problems with erections (erectile dysfunction). What happens during the procedure?  Hair on your chest may need to be removed so that small sticky patches called electrodes can be placed on your chest. These will transmit information that helps to monitor your heart during the procedure. An IV will be inserted into one of your veins. You might be given a medicine to control your heart rate during the procedure. This will help to ensure that good images are obtained. You will be asked to lie on an exam table. This table will slide in and out of the CT machine during the procedure. Contrast dye will be injected into the IV. You might feel warm, or you may get a metallic taste in your mouth. You will be given a medicine called nitroglycerin. This will relax or dilate the arteries in your heart. The table that you are lying on will move into the CT machine tunnel for the scan. The person running the machine will give you instructions while the scans are being done. You may be asked to: Keep your arms above your head. Hold your breath. Stay very still, even if the table is moving. When the scanning is complete, you will be moved out of the machine. The IV will be removed. The procedure may vary among health care providers and hospitals. What can I expect after the procedure? After your procedure, it is common to have: A metallic taste in your mouth from the contrast dye. A feeling of warmth. A headache from the nitroglycerin. Follow these instructions at home: Take over-the-counter and prescription medicines only as told by your health care provider. If you are told, drink enough fluid to keep your urine pale yellow. This will help to flush the contrast dye out of your body. Most people can return to their normal activities right after the  procedure. Ask your health care provider what activities are safe for you. It is up to you to get the results of your procedure. Ask your health care provider, or the department that is doing the procedure, when your results will be ready. Keep all follow-up visits as told by your health care provider. This is important. Contact a health care provider if: You have any symptoms of allergy to the contrast dye. These include: Shortness of breath. Rash or hives. A racing heartbeat. Summary A cardiac CT angiogram is a procedure to look at the heart and the area around the heart. It may be done to help  find the cause of chest pains or other symptoms of heart disease. During this procedure, a large X-ray machine, called a CT scanner, takes detailed pictures of the heart and the surrounding area after a contrast dye has been injected into blood vessels in the area. Ask your health care provider about changing or stopping your regular medicines before the procedure. This is especially important if you are taking diabetes medicines, blood thinners, or medicines to treat erectile dysfunction. If you are told, drink enough fluid to keep your urine pale yellow. This will help to flush the contrast dye out of your body. This information is not intended to replace advice given to you by your health care provider. Make sure you discuss any questions you have with your health care provider. Document Revised: 08/25/2021 Document Reviewed: 08/07/2019 Elsevier Patient Education  2022 [employer]

## 2022-02-14 NOTE — Progress Notes (Signed)
Cardiology Office Note:    Date:  02/14/2022   ID:  [facility], DOB 1965/12/29, MRN [id number]  PCP:  Pcp, No  Cardiologist:  Debbe OdeaBrian Agbor-Etang, MD  Electrophysiologist:  None   Referring MD: No ref. provider found   Chief Complaint  Patient presents with   Other    12 month follow up -- Patient c/o chest pain in the center of the chest.  Meds reviewed verbally with patient.     History of Present Illness:    Diana SheffieldDonna Michele' Antionette Galvan is a 56 y.o. female with a hx of asthma, nonobstructive CAD (mild mid LAD disease CCTA 2020), hyperlipidemia, current smoker for over 30 years, who presents due to chest pain  Patient complains of epigastric pain ongoing over the past week.  Symptoms are not associated with exertion.  She has tried taking Tums without any improvement.  Also has generalized muscle aches of her hips, neck and back.  Was given lidocaine patches, naproxen as needed.  She is trying to stop smoking, but this is really difficult.  She lost her insurance, did not obtain repeat lipid panel.  Also try to find a primary care provider.    Prior notes Echocardiogram 10/2019, normal systolic and diastolic function, EF 55 to 60%. Coronary CTA 09/2019 calcium score 0, mild noncalcified mid LAD disease.  Past Medical History:  Diagnosis Date   Asthma    Coronary artery disease    mild cad in mLAD on coronary CTA   Costochondritis    DDD (degenerative disc disease), lumbar    Depression    Thyroid disease    Thyroid goiter     Past Surgical History:  Procedure Laterality Date   APPENDECTOMY     COLONOSCOPY WITH PROPOFOL N/A 02/03/2020   Procedure: COLONOSCOPY WITH PROPOFOL;  Surgeon: Midge MiniumWohl, Darren, MD;  Location: [facility] ENDOSCOPY;  Service: Endoscopy;  Laterality: N/A;  Priority 4   THYROIDECTOMY     TUBAL LIGATION     VAGINAL HYSTERECTOMY  10/30/2002    Current Medications: Current Meds  Medication Sig   albuterol (VENTOLIN HFA) 108 (90 Base) MCG/ACT inhaler  Inhale 2 puffs into the lungs every 6 (six) hours as needed for wheezing or shortness of breath.   aspirin EC 81 MG tablet Take 1 tablet (81 mg total) by mouth daily.   atorvastatin (LIPITOR) 40 MG tablet Take 1 tablet (40 mg total) by mouth daily.   fluticasone (FLONASE) 50 MCG/ACT nasal spray Place 1 spray into both nostrils daily.   Fluticasone-Salmeterol (ADVAIR) 250-50 MCG/DOSE AEPB Inhale 1 puff into the lungs daily.   HYDROcodone-acetaminophen (NORCO) 5-325 MG tablet Take 1 tablet by mouth every 6 (six) hours as needed for moderate pain.   ipratropium-albuterol (DUONEB) 0.5-2.5 (3) MG/3ML SOLN Take 3 mLs by nebulization every 6 (six) hours as needed.   lidocaine (LIDODERM) 5 % Place 1 patch onto the skin daily. Remove & Discard patch within 12 hours or as directed by MD   montelukast (SINGULAIR) 10 MG tablet Take 1 tablet (10 mg total) by mouth at bedtime.   naproxen (NAPROSYN) 500 MG tablet Take 1 tablet (500 mg total) by mouth 2 (two) times daily with a meal.   Current Facility-Administered Medications for the 02/14/22 encounter (Office Visit) with Debbe OdeaAgbor-Etang, Dazaria Macneill, MD  Medication   lidocaine (XYLOCAINE) 2 % jelly 1 application     Allergies:   Adhesive [tape] and Wool alcohol [lanolin]   Social History   Socioeconomic History   Marital status: Divorced  Spouse name: Not on file   Number of children: Not on file   Years of education: Not on file   Highest education level: Not on file  Occupational History   Not on file  Tobacco Use   Smoking status: Some Days    Packs/day: 0.25    Years: 38.00    Pack years: 9.50    Types: Cigarettes   Smokeless tobacco: Never  Vaping Use   Vaping Use: Never used  Substance and Sexual Activity   Alcohol use: Not Currently   Drug use: No    Types: "Crack" cocaine    Comment: Recovered 2001   Sexual activity: Yes    Partners: Male    Birth control/protection: None  Other Topics Concern   Not on file  Social History Narrative    Not on file   Social Determinants of Health   Financial Resource Strain: Not on file  Food Insecurity: Not on file  Transportation Needs: Not on file  Physical Activity: Not on file  Stress: Not on file  Social Connections: Not on file     Family History: The patient's family history includes Breast cancer in her cousin; Congestive Heart Failure in her paternal aunt; Heart Problems in her mother; Heart failure in her paternal aunt and paternal grandmother; Mental illness in her mother.  ROS:   Please see the history of present illness.     All other systems reviewed and are negative.  EKGs/Labs/Other Studies Reviewed:    The following studies were reviewed today:   EKG:  EKG is  ordered today.  The ekg ordered today demonstrates normal sinus rhythm, normal ECG.  Recent Labs: 01/15/2022: BUN 10; Creatinine, Ser 0.77; Hemoglobin 11.7; Platelets 428; Potassium 3.5; Sodium 142  Recent Lipid Panel    Component Value Date/Time   CHOL 176 05/01/2019 1049   TRIG 78 05/01/2019 1049   HDL 51 05/01/2019 1049   CHOLHDL 3.5 05/01/2019 1049   LDLCALC 109 (H) 05/01/2019 1049    Physical Exam:    VS:  BP 110/60 (BP Location: Left Arm, Patient Position: Sitting, Cuff Size: Large)    Pulse 92    Ht 5\' 7"  (1.702 m)    Wt 226 lb (102.5 kg)    SpO2 97%    BMI 35.40 kg/m     Wt Readings from Last 3 Encounters:  02/14/22 226 lb (102.5 kg)  01/15/22 238 lb 1.6 oz (108 kg)  01/08/21 242 lb (109.8 kg)     GEN:  Well nourished, well developed in no acute distress, obese HEENT: Normal NECK: No JVD; No carotid bruits CARDIAC: RRR, no murmurs, rubs, gallops RESPIRATORY:  Clear to auscultation without rales, wheezing or rhonchi  ABDOMEN: Soft, non-tender, non-distended MUSCULOSKELETAL:  No edema; No deformity  SKIN: Warm and dry NEUROLOGIC:  Alert and oriented x 3 PSYCHIATRIC:  Normal affect   ASSESSMENT:    1. Precordial pain   2. Coronary artery disease involving native coronary  artery of native heart, unspecified whether angina present   3. Smoking   4. Pure hypercholesterolemia   5. Pre-procedure lab exam       PLAN:    In order of problems listed above:  Chest pain, current smoker, hyperlipidemia and history.  Previous CTA showed mild nonobstructive CAD.  Repeat coronary CTA to evaluate any progression of coronary artery disease.  Continue aspirin.  Hold Lipitor due to generalized muscle aches.  If symptoms do not resolve, resume Lipitor. if symptoms improve, we  will plan to start PCSK9.  Obtain fasting lipid profile. Nonobstructive CAD, mild noncalcified plaque in mid LAD.  Chest pain, coronary CTA as above.  Continue aspirin. Current smoker smoking cessation advised. Hyperlipidemia, LDL goal less than 70, check fasting lipid profile.  Hold Lipitor to see if muscle aches improved.  Follow-up in 4 to 6 weeks.   Medication Adjustments/Labs and Tests Ordered: Current medicines are reviewed at length with the patient today.  Concerns regarding medicines are outlined above.  Orders Placed This Encounter  Procedures   CT CORONARY MORPH W/CTA COR W/SCORE W/CA W/CM &/OR WO/CM   EKG 12-Lead   No orders of the defined types were placed in this encounter.   Patient Instructions  Medication Instructions:  - Your physician has recommended you make the following change in your medication:   1) HOLD lipitor (atorvastatin)  2) START protonix (pantoprazole) 40 mg: - take 1 tablet by mouth once daily  *If you need a refill on your cardiac medications before your next appointment, please call your pharmacy*   Lab Work: - Your physician recommends that you return FASTING for lab work this week/ early next week: Lipid/ BMP  If you have labs (blood work) drawn today and your tests are completely normal, you will receive your results only by: MyChart Message (if you have MyChart) OR A paper copy in the mail If you have any lab test that is abnormal or we need to  change your treatment, we will call you to review the results.   Testing/Procedures: 1) Cardiac CT angiogram:  - Your physician has requested that you have cardiac CT. Cardiac computed tomography (CT) is a painless test that uses an x-ray machine to take clear, detailed pictures of your heart.     Your cardiac CT will be scheduled at:   [facility] [address] [place], [place] [address] [phone]  Please arrive 15 mins early for check-in and test prep.  Please follow these instructions carefully (unless otherwise directed):   On the Night Before the Test: Be sure to Drink plenty of water. Do not consume any caffeinated/decaffeinated beverages or chocolate 12 hours prior to your test. Do not take any antihistamines 12 hours prior to your test.   On the Day of the Test: Drink plenty of water until 1 hour prior to the test. Do not eat any food 4 hours prior to the test. You may take your regular medications prior to the test.  Take metoprolol (Lopressor) 100 mg two hours prior to test x 1 dose. Take corlanor  FEMALES- please wear underwire-free bra if available, avoid dresses & tight clothing   *For Clinical Staff only. Please instruct patient the following:* Heart Rate Medication Recommendations for Cardiac CT  Resting HR < 50 bpm  No medication  Resting HR 50-60 bpm and BP >110/50 mmHG   Consider Metoprolol tartrate 25 mg PO 90-120 min prior to scan  Resting HR 60-65 bpm and BP >110/50 mmHG  Metoprolol tartrate 50 mg PO 90-120 minutes prior to scan   Resting HR > 65 bpm and BP >110/50 mmHG  Metoprolol tartrate 100 mg PO 90-120 minutes prior to scan  Consider Ivabradine 10-15 mg PO or a calcium channel blocker for resting HR >60 bpm and contraindication to metoprolol tartrate  Consider Ivabradine 10-15 mg PO in combination with metoprolol tartrate for HR >80 bpm         After the Test: Drink plenty of water. After  receiving IV contrast, you may experience a mild flushed feeling. This is normal. On occasion, you may experience a mild rash up to 24 hours after the test. This is not dangerous. If this occurs, you can take Benadryl 25 mg and increase your fluid intake. If you experience trouble breathing, this can be serious. If it is severe call 911 IMMEDIATELY. If it is mild, please call our office. If you take any of these medications: Glipizide/Metformin, Avandament, Glucavance, please do not take 48 hours after completing test unless otherwise instructed.  We will call to schedule your test 2-4 weeks out understanding that some insurance companies will need an authorization prior to the service being performed.   For non-scheduling related questions, please contact the cardiac imaging nurse navigator should you have any questions/concerns: Rockwell [place], Cardiac Imaging Nurse Navigator Larey Brick, Cardiac Imaging Nurse Navigator [facility] Direct Office Dial: [phone]   For scheduling needs, including cancellations and rescheduling, please call [place], [phone].     Follow-Up: At [facility], you and your health needs are our priority.  As part of our continuing mission to provide you with exceptional heart care, we have created designated Provider Care Teams.  These Care Teams include your primary Cardiologist (physician) and Advanced Practice Providers (APPs -  Physician Assistants and Nurse Practitioners) who all work together to provide you with the care you need, when you need it.  We recommend signing up for the patient portal called "MyChart".  Sign up information is provided on this After Visit Summary.  MyChart is used to connect with patients for Virtual Visits (Telemedicine).  Patients are able to view lab/test results, encounter notes, upcoming appointments, etc.  Non-urgent messages can be sent to your provider as well.   To learn more about what  you can do with MyChart, go to [email or web address].    Your next appointment:   After your cardiac CT is completed   The format for your next appointment:   In Person  Provider:   Debbe Odea, MD    Other Instructions  Cardiac CT Angiogram A cardiac CT angiogram is a procedure to look at the heart and the area around the heart. It may be done to help find the cause of chest pains or other symptoms of heart disease. During this procedure, a substance called contrast dye is injected into the blood vessels in the area to be checked. A large X-ray machine, called a CT scanner, then takes detailed pictures of the heart and the surrounding area. The procedure is also sometimes called a coronary CT angiogram, coronary artery scanning, or CTA. A cardiac CT angiogram allows the health care provider to see how well blood is flowing to and from the heart. The health care provider will be able to see if there are any problems, such as: Blockage or narrowing of the coronary arteries in the heart. Fluid around the heart. Signs of weakness or disease in the muscles, valves, and tissues of the heart. Tell a health care provider about: Any allergies you have. This is especially important if you have had a previous allergic reaction to contrast dye. All medicines you are taking, including vitamins, herbs, eye drops, creams, and over-the-counter medicines. Any blood disorders you have. Any surgeries you have had. Any medical conditions you have. Whether you are pregnant or may be pregnant. Any anxiety disorders, chronic pain, or other conditions you have that may increase your stress or prevent you from lying still. What  are the risks? Generally, this is a safe procedure. However, problems may occur, including: Bleeding. Infection. Allergic reactions to medicines or dyes. Damage to other structures or organs. Kidney damage from the contrast dye that is used. Increased risk of cancer from  radiation exposure. This risk is low. Talk with your health care provider about: The risks and benefits of testing. How you can receive the lowest dose of radiation. What happens before the procedure? Wear comfortable clothing and remove any jewelry, glasses, dentures, and hearing aids. Follow instructions from your health care provider about eating and drinking. This may include: For 12 hours before the procedure -- avoid caffeine. This includes tea, coffee, soda, energy drinks, and diet pills. Drink plenty of water or other fluids that do not have caffeine in them. Being well hydrated can prevent complications. For 4-6 hours before the procedure -- stop eating and drinking. The contrast dye can cause nausea, but this is less likely if your stomach is empty. Ask your health care provider about changing or stopping your regular medicines. This is especially important if you are taking diabetes medicines, blood thinners, or medicines to treat problems with erections (erectile dysfunction). What happens during the procedure?  Hair on your chest may need to be removed so that small sticky patches called electrodes can be placed on your chest. These will transmit information that helps to monitor your heart during the procedure. An IV will be inserted into one of your veins. You might be given a medicine to control your heart rate during the procedure. This will help to ensure that good images are obtained. You will be asked to lie on an exam table. This table will slide in and out of the CT machine during the procedure. Contrast dye will be injected into the IV. You might feel warm, or you may get a metallic taste in your mouth. You will be given a medicine called nitroglycerin. This will relax or dilate the arteries in your heart. The table that you are lying on will move into the CT machine tunnel for the scan. The person running the machine will give you instructions while the scans are being done.  You may be asked to: Keep your arms above your head. Hold your breath. Stay very still, even if the table is moving. When the scanning is complete, you will be moved out of the machine. The IV will be removed. The procedure may vary among health care providers and hospitals. What can I expect after the procedure? After your procedure, it is common to have: A metallic taste in your mouth from the contrast dye. A feeling of warmth. A headache from the nitroglycerin. Follow these instructions at home: Take over-the-counter and prescription medicines only as told by your health care provider. If you are told, drink enough fluid to keep your urine pale yellow. This will help to flush the contrast dye out of your body. Most people can return to their normal activities right after the procedure. Ask your health care provider what activities are safe for you. It is up to you to get the results of your procedure. Ask your health care provider, or the department that is doing the procedure, when your results will be ready. Keep all follow-up visits as told by your health care provider. This is important. Contact a health care provider if: You have any symptoms of allergy to the contrast dye. These include: Shortness of breath. Rash or hives. A racing heartbeat. Summary A cardiac CT  angiogram is a procedure to look at the heart and the area around the heart. It may be done to help find the cause of chest pains or other symptoms of heart disease. During this procedure, a large X-ray machine, called a CT scanner, takes detailed pictures of the heart and the surrounding area after a contrast dye has been injected into blood vessels in the area. Ask your health care provider about changing or stopping your regular medicines before the procedure. This is especially important if you are taking diabetes medicines, blood thinners, or medicines to treat erectile dysfunction. If you are told, drink enough  fluid to keep your urine pale yellow. This will help to flush the contrast dye out of your body. This information is not intended to replace advice given to you by your health care provider. Make sure you discuss any questions you have with your health care provider. Document Revised: 08/25/2021 Document Reviewed: 08/07/2019 Elsevier Patient Education  2022 [employer].     Signed, Debbe Odea, MD  02/14/2022 12:33 PM    [facility]

## 2022-02-17 ENCOUNTER — Other Ambulatory Visit
Admission: RE | Admit: 2022-02-17 | Discharge: 2022-02-17 | Disposition: A | Discharge status: Home / Self Care | Payer: 59 | Source: Ambulatory Visit | Attending: Cardiology | Admitting: Cardiology

## 2022-02-17 ENCOUNTER — Other Ambulatory Visit: Payer: 59

## 2022-02-17 DIAGNOSIS — E7800 Pure hypercholesterolemia, unspecified: Secondary | ICD-10-CM | POA: Insufficient documentation

## 2022-02-17 DIAGNOSIS — Z01812 Encounter for preprocedural laboratory examination: Secondary | ICD-10-CM | POA: Diagnosis present

## 2022-02-17 DIAGNOSIS — I2510 Atherosclerotic heart disease of native coronary artery without angina pectoris: Secondary | ICD-10-CM | POA: Diagnosis present

## 2022-02-17 LAB — BASIC METABOLIC PANEL
Anion gap: 8 (ref 5–15)
BUN: 13 mg/dL (ref 6–20)
CO2: 27 mmol/L (ref 22–32)
Calcium: 9.3 mg/dL (ref 8.9–10.3)
Chloride: 106 mmol/L (ref 98–111)
Creatinine, Ser: 0.78 mg/dL (ref 0.44–1.00)
GFR, Estimated: >60 mL/min (ref >60)
Glucose, Bld: 93 mg/dL (ref 70–99)
Potassium: 3.8 mmol/L (ref 3.5–5.1)
Sodium: 141 mmol/L (ref 135–145)

## 2022-02-17 LAB — LIPID PANEL
Cholesterol: 141 mg/dL (ref 0–200)
HDL: 54 mg/dL (ref >40)
LDL Cholesterol: 78 mg/dL (ref 0–99)
Total CHOL/HDL Ratio: 2.6 RATIO
Triglycerides: 44 mg/dL (ref <150)
VLDL: 9 mg/dL (ref 0–40)

## 2022-02-18 ENCOUNTER — Telehealth (HOSPITAL_COMMUNITY): Payer: Self-pay | Admitting: *Deleted

## 2022-02-18 NOTE — Telephone Encounter (Signed)
Attempted to call patient regarding upcoming cardiac CT appointment. °Left message on voicemail with name and callback number ° °Pierre Dellarocco RN Navigator Cardiac Imaging °[facility] °[phone] Office °[phone] Cell ° °

## 2022-02-21 ENCOUNTER — Ambulatory Visit
Admission: RE | Admit: 2022-02-21 | Discharge: 2022-02-21 | Disposition: A | Discharge status: Home / Self Care | Payer: 59 | Source: Ambulatory Visit | Attending: Cardiology | Admitting: Cardiology

## 2022-02-21 ENCOUNTER — Encounter: Payer: Self-pay | Admitting: Cardiology

## 2022-02-21 ENCOUNTER — Other Ambulatory Visit: Payer: Self-pay

## 2022-02-21 DIAGNOSIS — R072 Precordial pain: Secondary | ICD-10-CM

## 2022-02-21 MED ORDER — NITROGLYCERIN 0.4 MG SL SUBL
0.4000 mg | SUBLINGUAL_TABLET | Freq: Once | SUBLINGUAL | Status: AC
  Administered 2022-02-21: 0.4 mg via SUBLINGUAL

## 2022-02-21 MED ORDER — IOHEXOL 350 MG/ML SOLN
100.0000 mL | Freq: Once | INTRAVENOUS | Status: AC | PRN
  Administered 2022-02-21: 100 mL via INTRAVENOUS

## 2022-02-21 MED ORDER — SODIUM CHLORIDE 0.9 % IV BOLUS
500.0000 mL | Freq: Once | INTRAVENOUS | Status: AC
  Administered 2022-02-21: 500 mL via INTRAVENOUS

## 2022-02-21 NOTE — Progress Notes (Signed)
Dr. Azucena Cecil aware of BP and HR upon arrival. Orders written. Patient tolerated procedure well. NS bolus given. Ambulate w/o difficulty. Denies light headedness or being dizzy. "Feel better than when I got here." Sitting in chair drinking water provided. BP at baseline. Encouraged to drink extra water today and reasoning explained. Verbalized understanding. All questions answered. ABC intact. No further needs. Discharge from procedure area w/o issues.

## 2022-03-22 ENCOUNTER — Other Ambulatory Visit (HOSPITAL_BASED_OUTPATIENT_CLINIC_OR_DEPARTMENT_OTHER): Payer: Self-pay | Admitting: Orthopaedic Surgery

## 2022-03-22 DIAGNOSIS — M25559 Pain in unspecified hip: Secondary | ICD-10-CM

## 2022-03-23 ENCOUNTER — Other Ambulatory Visit: Payer: Self-pay

## 2022-03-23 ENCOUNTER — Ambulatory Visit (INDEPENDENT_AMBULATORY_CARE_PROVIDER_SITE_OTHER): Payer: 59 | Admitting: Orthopaedic Surgery

## 2022-03-23 ENCOUNTER — Ambulatory Visit (HOSPITAL_BASED_OUTPATIENT_CLINIC_OR_DEPARTMENT_OTHER)
Admission: RE | Admit: 2022-03-23 | Discharge: 2022-03-23 | Disposition: A | Discharge status: Home / Self Care | Payer: 59 | Source: Ambulatory Visit | Attending: Orthopaedic Surgery | Admitting: Orthopaedic Surgery

## 2022-03-23 DIAGNOSIS — M1611 Unilateral primary osteoarthritis, right hip: Secondary | ICD-10-CM | POA: Diagnosis not present

## 2022-03-23 DIAGNOSIS — M542 Cervicalgia: Secondary | ICD-10-CM | POA: Diagnosis not present

## 2022-03-23 DIAGNOSIS — M533 Sacrococcygeal disorders, not elsewhere classified: Secondary | ICD-10-CM | POA: Diagnosis not present

## 2022-03-23 DIAGNOSIS — Z139 Encounter for screening, unspecified: Secondary | ICD-10-CM

## 2022-03-23 DIAGNOSIS — M25559 Pain in unspecified hip: Secondary | ICD-10-CM | POA: Insufficient documentation

## 2022-03-23 LAB — GLUCOSE, POCT (MANUAL RESULT ENTRY): POC Glucose: 91 mg/dl (ref 70–99)

## 2022-03-23 NOTE — Progress Notes (Signed)
? ?                            ? ? ?Chief Complaint: neck pain and bilateral hip pain ?  ? ? ?History of Present Illness:  ? ? ?Diana Galvan is a 56 y.o. female presents today with upper trapezial as well as bilateral hip pain.  These been going on for several months although the right hip has been significantly aggravated since January of this year.  She initially went to the emergency room and was given an intramuscular injection with little relief.  This continues to awaken her at night.  She states that upper neck pain is not associated with any type of radiation down the arm.  She has been previously using naproxen as well as lidocaine patches.  These have only of limited relief.  She is currently working from home.  She does have a history of being told that she does have some lumbar stenosis. ? ? ? ?Surgical History:   ?None ? ?PMH/PSH/Family History/Social History/Meds/Allergies:   ? ?Past Medical History:  ?Diagnosis Date  ? Asthma   ? Coronary artery disease   ? mild cad in mLAD on coronary CTA  ? Costochondritis   ? DDD (degenerative disc disease), lumbar   ? Depression   ? Thyroid disease   ? Thyroid goiter   ? ?Past Surgical History:  ?Procedure Laterality Date  ? APPENDECTOMY    ? COLONOSCOPY WITH PROPOFOL N/A 02/03/2020  ? Procedure: COLONOSCOPY WITH PROPOFOL;  Surgeon: Midge MiniumWohl, Darren, MD;  [facility];  Service: Endoscopy;  Laterality: N/A;  Priority 4  ? THYROIDECTOMY    ? TUBAL LIGATION    ? VAGINAL HYSTERECTOMY  10/30/2002  ? ?Social History  ? ?Socioeconomic History  ? Marital status: Divorced  ?  Spouse name: Not on file  ? Number of children: Not on file  ? Years of education: Not on file  ? Highest education level: Not on file  ?Occupational History  ? Not on file  ?Tobacco Use  ? Smoking status: Some Days  ?  Packs/day: 0.25  ?  Years: 38.00  ?  Pack years: 9.50  ?  Types: Cigarettes  ? Smokeless tobacco: Never  ?Vaping Use  ? Vaping Use: Never used  ?Substance and Sexual  Activity  ? Alcohol use: Not Currently  ? Drug use: No  ?  Types: "Crack" cocaine  ?  Comment: Recovered 2001  ? Sexual activity: Yes  ?  Partners: Male  ?  Birth control/protection: None  ?Other Topics Concern  ? Not on file  ?Social History Narrative  ? Not on file  ? ?Social Determinants of Health  ? ?Financial Resource Strain: Not on file  ?Food Insecurity: Not on file  ?Transportation Needs: Not on file  ?Physical Activity: Not on file  ?Stress: Not on file  ?Social Connections: Not on file  ? ?Family History  ?Problem Relation Age of Onset  ? Mental illness Mother   ? Heart Problems Mother   ? Congestive Heart Failure Paternal Aunt   ? Heart failure Paternal Aunt   ? Breast cancer Cousin   ? Heart failure Paternal Grandmother   ? ?Allergies  ?Allergen Reactions  ? Adhesive [Tape] Other (See Comments)  ?  Blisters and burning  ? Wool Alcohol [Lanolin] Itching  ? ?Current Outpatient Medications  ?Medication Sig Dispense Refill  ? albuterol (VENTOLIN HFA) 108 (90 Base) MCG/ACT  inhaler Inhale 2 puffs into the lungs every 6 (six) hours as needed for wheezing or shortness of breath. 18 g 2  ? aspirin EC 81 MG tablet Take 1 tablet (81 mg total) by mouth daily. 90 tablet 3  ? atorvastatin (LIPITOR) 40 MG tablet Take 1 tablet (40 mg total) by mouth daily. 90 tablet 3  ? fluticasone (FLONASE) 50 MCG/ACT nasal spray Place 1 spray into both nostrils daily. 16 g 2  ? Fluticasone-Salmeterol (ADVAIR) 250-50 MCG/DOSE AEPB Inhale 1 puff into the lungs daily. 60 each 1  ? HYDROcodone-acetaminophen (NORCO) 5-325 MG tablet Take 1 tablet by mouth every 6 (six) hours as needed for moderate pain. 15 tablet 0  ? ipratropium-albuterol (DUONEB) 0.5-2.5 (3) MG/3ML SOLN Take 3 mLs by nebulization every 6 (six) hours as needed.    ? ivabradine (CORLANOR) 7.5 MG TABS tablet Take 2 tablets (15 mg) by mouth 2 hours prior to your Cardiac CT 2 tablet 0  ? lidocaine (LIDODERM) 5 % Place 1 patch onto the skin daily. Remove & Discard patch within  12 hours or as directed by MD 30 patch 0  ? metoprolol tartrate (LOPRESSOR) 100 MG tablet Take 1 tablet (100 mg) by mouth 2 hours prior to your Cardiac CT 1 tablet 0  ? montelukast (SINGULAIR) 10 MG tablet Take 1 tablet (10 mg total) by mouth at bedtime. 30 tablet 1  ? naproxen (NAPROSYN) 500 MG tablet Take 1 tablet (500 mg total) by mouth 2 (two) times daily with a meal. 14 tablet 0  ? pantoprazole (PROTONIX) 40 MG tablet Take 1 tablet (40 mg total) by mouth daily. 30 tablet 6  ? ?Current Facility-Administered Medications  ?Medication Dose Route Frequency Provider Last Rate Last Admin  ? lidocaine (XYLOCAINE) 2 % jelly 1 application  1 application. Urethral Once Sondra Come, MD      ? ?No results found. ? ?Review of Systems:   ?A ROS was performed including pertinent positives and negatives as documented in the HPI. ? ?Physical Exam :   ?Constitutional: NAD and appears stated age ?Neurological: Alert and oriented ?Psych: Appropriate affect and cooperative ?There were no vitals taken for this visit.  ? ?Comprehensive Musculoskeletal Exam:   ? ?Inspection Right Left  ?Skin No atrophy or gross abnormalities appreciated No atrophy or gross abnormalities appreciated  ?Palpation    ?Tenderness None None  ?Crepitus None None  ?Range of Motion    ?Flexion (passive) 120 120  ?Extension 30 30  ?IR 30 with pain in groin 30  ?ER 45 with groin pain 45  ?Strength    ?Flexion  5/5 5/5  ?Extension 5/5 5/5  ?Special Tests    ?FABER Negative Negative  ?FADIR Negative Negative  ?ER Lag/Capsular Insufficiency Negative Negative  ?Instability Negative Negative  ?Sacroiliac pain Negative  Negative   ?Instability    ?Generalized Laxity No No  ?Neurologic    ?sciatic, femoral, obturator nerves intact to light sensation  ?Vascular/Lymphatic    ?DP pulse 2+ 2+  ?Lumbar Exam    ?Patient has symmetric lumbar range of motion with negative pain referral to hip  ?Pain about the right trapezial muscles involving the cervical  spine. ?Tenderness palpation about the right SI joint ? ?Imaging:   ?Xray (AP pelvis, bilateral hips 2 views): ?Right worse than left osteoarthritis ? ? ?I personally reviewed and interpreted the radiographs. ? ? ?Assessment:   ?Patient 57-year-old female with right worse than left hip osteoarthritis as well as right SI joint type  pain.  Overall I do believe that an x-ray guided injection for the right SI joint could be significantly beneficial for her.  Given that this would be helpful, I do believe that a right femoral acetabular injection could be performed, we will plan to refer her to Dr. Alvester Morin for this.  With regard to her neck pain I do believe she be a candidate for physical therapy and dry needling of the trapezium.  We will plan to order this.  I described that they can also work with her on the right hip as well although I do believe that injections would be the predominant form of helping her. ? ?Plan :   ? ?-She will plan to follow-up with me should her injections wear off ? ? ? ? ?I personally saw and evaluated the patient, and participated in the management and treatment plan. ? ?Huel Cote, MD ?Attending Physician, Orthopedic Surgery ? ?This document was dictated using Conservation officer, historic buildings. A reasonable attempt at proof reading has been made to minimize errors. ?

## 2022-03-23 NOTE — Congregational Nurse Program (Signed)
?  Dept: [phone] ? ? ?Congregational Nurse Program Note ? ?Date of Encounter: 03/23/2022 ? ?Past Medical History: ?Past Medical History:  ?Diagnosis Date  ? Asthma   ? Coronary artery disease   ? mild cad in mLAD on coronary CTA  ? Costochondritis   ? DDD (degenerative disc disease), lumbar   ? Depression   ? Thyroid disease   ? Thyroid goiter   ? ? ?Encounter Details: ? CNP Questionnaire - 03/23/22 1530   ? ?  ? Questionnaire  ? Do you give verbal consent to treat you today? Yes   ? Location Patient Served  Not Applicable   ? Visit Setting Church or Organization   ? Patient Status Unknown   ? Sport and exercise psychologist or [employer]   ? Insurance Referral Affordable Care (ACA)   ? Medication N/A   ? Medical Provider No   ? Screening Referrals N/A   ? Medical Referral [facility] PCP/Clinic;Vision;Dental;Non-[facility]   ? Medical Appointment Made N/A   ? Food Have Food Insecurities   ? Transportation N/A   ? Housing/Utilities N/A   ? Interpersonal Safety Do not feel safe at current residence   ? Intervention Blood pressure;Blood glucose;Navigate Healthcare System;Counsel   ? ED Visit Averted N/A   ? Life-Saving Intervention Made N/A   ? ?  ?  ? ?  ?[facility] only [facility] for initial visit requesting assistance navigating the health care system in [place]. Counseled re: confidentiality and services available. Agrees to screening. BP 142/102 (counseled re elevation and needs follow up); Glucose 91 fasting. Needs dentures replaced as hers are falling apart and she has no dental insurance; needs glasses as 3.25 reading glasses no longer work and doesn't have insurance to cover; needs mental health support as she is dealing with depression;  needs financial resources; needs PCP. Has "Obama Care" insurance.  Currently being followed by ortho due to "cervical sprain" and severe hip pain with possible upcoming R hip replacement. Hx of Erb's Palsy with left hand deformity. Lives alone; works at  home; and verbalizes concern on how she will manage all of this.Denies feelings of hurting self or others. 1. Provided [facility] clinic service information and Crisis phone number.  2. Completed and emailed [employer] application for vision exam and glasses. 3. Provided PCP options for [facility]. Client agrees to contact w/ PCP and to check out [employer] insurance website to find what mental health services are covered. 4. This nurse to research possible sites for denture replacement. 5. RTC prn with this nurse. Rhermann, RN ?  ? ? ? ?
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
## 2022-03-31 ENCOUNTER — Encounter: Payer: Self-pay | Admitting: Physical Therapy

## 2022-03-31 ENCOUNTER — Ambulatory Visit: Payer: 59 | Attending: Orthopaedic Surgery | Admitting: Physical Therapy

## 2022-03-31 DIAGNOSIS — R252 Cramp and spasm: Secondary | ICD-10-CM

## 2022-03-31 DIAGNOSIS — M542 Cervicalgia: Secondary | ICD-10-CM

## 2022-04-04 ENCOUNTER — Ambulatory Visit: Payer: 59 | Admitting: Cardiology

## 2022-04-05 ENCOUNTER — Encounter: Payer: Self-pay | Admitting: Cardiology

## 2022-04-12 ENCOUNTER — Ambulatory Visit: Payer: 59 | Admitting: Physical Therapy

## 2022-04-12 ENCOUNTER — Telehealth: Payer: Self-pay

## 2022-04-12 ENCOUNTER — Encounter: Payer: Self-pay | Admitting: Physical Therapy

## 2022-04-12 DIAGNOSIS — M542 Cervicalgia: Secondary | ICD-10-CM

## 2022-04-12 DIAGNOSIS — R252 Cramp and spasm: Secondary | ICD-10-CM

## 2022-04-12 NOTE — Telephone Encounter (Signed)
Left vm and sent mychart message to confirm 04/18/22 appointment-Toni ?
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
## 2022-04-14 ENCOUNTER — Other Ambulatory Visit (HOSPITAL_BASED_OUTPATIENT_CLINIC_OR_DEPARTMENT_OTHER): Payer: Self-pay | Admitting: Orthopaedic Surgery

## 2022-04-14 DIAGNOSIS — M542 Cervicalgia: Secondary | ICD-10-CM

## 2022-04-14 DIAGNOSIS — M25551 Pain in right hip: Secondary | ICD-10-CM

## 2022-04-18 ENCOUNTER — Ambulatory Visit: Payer: Self-pay | Admitting: Nurse Practitioner

## 2022-04-25 ENCOUNTER — Encounter: Payer: Self-pay | Admitting: Physical Medicine and Rehabilitation

## 2022-04-25 ENCOUNTER — Ambulatory Visit: Payer: Self-pay

## 2022-04-25 ENCOUNTER — Ambulatory Visit: Payer: BLUE CROSS/BLUE SHIELD | Attending: Orthopaedic Surgery

## 2022-04-25 ENCOUNTER — Ambulatory Visit (INDEPENDENT_AMBULATORY_CARE_PROVIDER_SITE_OTHER): Payer: BLUE CROSS/BLUE SHIELD | Admitting: Physical Medicine and Rehabilitation

## 2022-04-25 VITALS — BP 128/84 | HR 86

## 2022-04-25 DIAGNOSIS — R252 Cramp and spasm: Secondary | ICD-10-CM | POA: Insufficient documentation

## 2022-04-25 DIAGNOSIS — M25551 Pain in right hip: Secondary | ICD-10-CM | POA: Diagnosis present

## 2022-04-25 DIAGNOSIS — M542 Cervicalgia: Secondary | ICD-10-CM | POA: Insufficient documentation

## 2022-04-25 DIAGNOSIS — M461 Sacroiliitis, not elsewhere classified: Secondary | ICD-10-CM

## 2022-04-25 DIAGNOSIS — M25552 Pain in left hip: Secondary | ICD-10-CM | POA: Insufficient documentation

## 2022-04-25 NOTE — Progress Notes (Signed)
? ?Diana Galvan' Diana Galvan - 56 y.o. female MRN [id number]  Date of birth: 04/16/1966 ? ?Office Visit Note: ?Visit Date: 04/25/2022 ?PCP: Pcp, No ?Referred by: Huel Cote, MD ? ?Subjective: ?Chief Complaint  ?Patient presents with  ? Lower Back - Pain  ? Right Hip - Pain  ? Left Hip - Pain  ? ?HPI:  Diana Galvan is a 56 y.o. female who comes in today at the request of Dr. Huel Cote for planned Right anesthetic sacroiliac joint injection with fluoroscopic guidance.  The patient has failed conservative care including home exercise, medications, time and activity modification.  This injection will be diagnostic and hopefully therapeutic.  Please see requesting physician notes for further details and justification.  Her pain is below the belt line on the right posterior lateral buttock area.  Not really much in the way of groin pain although occasionally gets pain really in a nondermatomal pattern across the leg.  Nothing past the knee.  Depending on results of the sacroiliac joint would look at requested hip injection. ? ?ROS Otherwise per HPI. ? ?Assessment & Plan: ?Visit Diagnoses:  ?  ICD-10-CM   ?1. Sacroiliitis (HCC)  M46.1 XR C-ARM NO REPORT  ?  ?  ?Plan: No additional findings.  ? ?Meds & Orders: No orders of the defined types were placed in this encounter. ?  ?Orders Placed This Encounter  ?Procedures  ? Sacroiliac Joint Inj  ? XR C-ARM NO REPORT  ?  ?Follow-up: Return for visit to requesting provider as needed.  ? ?Procedures: ?Sacroiliac Joint Inj on 04/25/2022 9:00 AM ?Indications: pain and diagnostic evaluation ?Details: 22 G 3.5 in needle, fluoroscopy-guided posterior approach ?Medications: 40 mg triamcinolone acetonide 40 MG/ML; 2 mL bupivacaine 0.5 % ? ?Good flow of contrast in the lower part of the sacroiliac joint with partial arthrogram. ?Consent was given by the patient. Immediately prior to procedure a time out was called to verify the correct patient, procedure, equipment, support  staff and site/side marked as required. Patient was prepped and draped in the usual sterile fashion.  ? ?  ?   ? ?Clinical History: ?No specialty comments available.  ? ? ? ?Objective:  VS:  HT:    WT:   BMI:     BP:128/84  HR:86bpm  TEMP: ( )  RESP:  ?Physical Exam ?Vitals and nursing note reviewed.  ?Constitutional:   ?   General: She is not in acute distress. ?   Appearance: Normal appearance. She is not ill-appearing.  ?HENT:  ?   Head: Normocephalic and atraumatic.  ?   Right Ear: External ear normal.  ?   Left Ear: External ear normal.  ?Eyes:  ?   Extraocular Movements: Extraocular movements intact.  ?Cardiovascular:  ?   Rate and Rhythm: Normal rate.  ?   Pulses: Normal pulses.  ?Pulmonary:  ?   Effort: Pulmonary effort is normal. No respiratory distress.  ?Abdominal:  ?   General: There is no distension.  ?   Palpations: Abdomen is soft.  ?Musculoskeletal:     ?   General: Tenderness present.  ?   Cervical back: Neck supple.  ?   Right lower leg: No edema.  ?   Left lower leg: No edema.  ?   Comments: Patient has good distal strength with no pain over the greater trochanters.  No clonus or focal weakness. Positive Fortin finger sign, Patrick's testing and lateral compression test. ?   ?Skin: ?   Findings: No erythema,  lesion or rash.  ?Neurological:  ?   General: No focal deficit present.  ?   Mental Status: She is alert and oriented to person, place, and time.  ?   Sensory: No sensory deficit.  ?   Motor: No weakness or abnormal muscle tone.  ?   Coordination: Coordination normal.  ?Psychiatric:     ?   Mood and Affect: Mood normal.     ?   Behavior: Behavior normal.  ?  ? ?Imaging: ?No results found. ?

## 2022-04-25 NOTE — Progress Notes (Signed)
Pt state lower back pain that travels to both hips. Pt state walking, standing and laying down makes the pain worse. Pt state she uses pain patches and over the counter pain meds to help ease.  ? ?Numeric Pain Rating Scale and Functional Assessment ?Average Pain 6 ? ? ?In the last MONTH (on 0-10 scale) has pain interfered with the following? ? ?1. General activity like being  able to carry out your everyday physical activities such as walking, climbing stairs, carrying groceries, or moving a chair?  ?Rating(10) ? ? ?-BT, +Dye Allergies. ? ?
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
## 2022-05-02 ENCOUNTER — Ambulatory Visit (INDEPENDENT_AMBULATORY_CARE_PROVIDER_SITE_OTHER): Payer: BLUE CROSS/BLUE SHIELD | Admitting: Physical Medicine and Rehabilitation

## 2022-05-02 ENCOUNTER — Ambulatory Visit: Payer: Self-pay

## 2022-05-02 ENCOUNTER — Encounter: Payer: Self-pay | Admitting: Physical Medicine and Rehabilitation

## 2022-05-02 ENCOUNTER — Telehealth: Payer: Self-pay

## 2022-05-02 VITALS — BP 132/84 | HR 78 | Ht 67.0 in | Wt 226.0 lb

## 2022-05-02 DIAGNOSIS — M25551 Pain in right hip: Secondary | ICD-10-CM

## 2022-05-02 NOTE — Progress Notes (Unsigned)
   [facility] 56 [facility] MRN [id number]  Date of birth: 1966-05-08  Office Visit Note: Visit Date: 05/02/2022 PCP: Pcp, No Referred by: No ref. provider found  Subjective: Chief Complaint  Patient presents with   Right Hip - Follow-up   HPI:  Diana Galvan is a 56 y.o. female who comes in todayHPI ROS Otherwise per HPI.  Assessment & Plan: Visit Diagnoses:    ICD-10-CM   1. Pain in right hip  M25.551 XR C-ARM NO REPORT      Plan: No additional findings.   Meds & Orders: No orders of the defined types were placed in this encounter.   Orders Placed This Encounter  Procedures   XR C-ARM NO REPORT    Follow-up: No follow-ups on file.   Procedures: No procedures performed      Clinical History: No specialty comments available.     Objective:  VS:  HT:5\' 7"  (170.2 cm)   WT:226 lb (102.5 kg)  BMI:35.39    BP:132/84  HR:78bpm  TEMP: ( )  RESP:95 % Physical Exam   Imaging: No results found.

## 2022-05-02 NOTE — Telephone Encounter (Signed)
Left vm to confirm 05/09/22 appointment-Toni ?

## 2022-05-05 MED ORDER — BUPIVACAINE HCL 0.5 % IJ SOLN
2.0000 mL | INTRAMUSCULAR | Status: AC | PRN
  Administered 2022-04-25: 2 mL via INTRA_ARTICULAR

## 2022-05-05 MED ORDER — TRIAMCINOLONE ACETONIDE 40 MG/ML IJ SUSP
40.0000 mg | INTRAMUSCULAR | Status: AC | PRN
  Administered 2022-04-25: 40 mg via INTRA_ARTICULAR

## 2022-05-09 ENCOUNTER — Ambulatory Visit: Payer: Self-pay | Admitting: Nurse Practitioner

## 2022-05-09 ENCOUNTER — Ambulatory Visit: Payer: BLUE CROSS/BLUE SHIELD

## 2022-05-24 ENCOUNTER — Encounter: Payer: 59 | Admitting: Physical Therapy

## 2022-05-30 ENCOUNTER — Telehealth: Payer: Self-pay

## 2022-05-30 ENCOUNTER — Ambulatory Visit: Payer: BLUE CROSS/BLUE SHIELD | Attending: Orthopaedic Surgery

## 2022-05-30 ENCOUNTER — Ambulatory Visit: Payer: Self-pay

## 2022-05-30 DIAGNOSIS — R252 Cramp and spasm: Secondary | ICD-10-CM | POA: Insufficient documentation

## 2022-05-30 DIAGNOSIS — M25552 Pain in left hip: Secondary | ICD-10-CM | POA: Insufficient documentation

## 2022-05-30 DIAGNOSIS — M25551 Pain in right hip: Secondary | ICD-10-CM | POA: Insufficient documentation

## 2022-05-30 DIAGNOSIS — M542 Cervicalgia: Secondary | ICD-10-CM | POA: Insufficient documentation

## 2022-05-30 NOTE — Telephone Encounter (Signed)
PT called pt and left message due to no-show for appt today.

## 2022-06-06 ENCOUNTER — Ambulatory Visit: Payer: BLUE CROSS/BLUE SHIELD | Attending: Orthopaedic Surgery

## 2022-06-06 DIAGNOSIS — M542 Cervicalgia: Secondary | ICD-10-CM | POA: Insufficient documentation

## 2022-06-06 DIAGNOSIS — R252 Cramp and spasm: Secondary | ICD-10-CM | POA: Insufficient documentation

## 2022-06-06 DIAGNOSIS — M25551 Pain in right hip: Secondary | ICD-10-CM | POA: Insufficient documentation

## 2022-06-06 DIAGNOSIS — M25552 Pain in left hip: Secondary | ICD-10-CM | POA: Insufficient documentation

## 2022-06-17 ENCOUNTER — Encounter: Payer: Self-pay | Admitting: Physical Medicine and Rehabilitation

## 2022-08-22 ENCOUNTER — Emergency Department: Payer: BLUE CROSS/BLUE SHIELD

## 2022-08-22 ENCOUNTER — Encounter: Payer: Self-pay | Admitting: Emergency Medicine

## 2022-08-22 DIAGNOSIS — G894 Chronic pain syndrome: Secondary | ICD-10-CM | POA: Insufficient documentation

## 2022-08-22 DIAGNOSIS — M542 Cervicalgia: Secondary | ICD-10-CM | POA: Insufficient documentation

## 2022-08-22 DIAGNOSIS — M25552 Pain in left hip: Secondary | ICD-10-CM | POA: Insufficient documentation

## 2022-08-22 DIAGNOSIS — M25551 Pain in right hip: Secondary | ICD-10-CM | POA: Insufficient documentation

## 2022-08-22 LAB — URINALYSIS, ROUTINE W REFLEX MICROSCOPIC
Bilirubin Urine: NEGATIVE
Glucose, UA: NEGATIVE mg/dL
Ketones, ur: NEGATIVE mg/dL
Leukocytes,Ua: NEGATIVE
Nitrite: NEGATIVE
Protein, ur: NEGATIVE mg/dL
Specific Gravity, Urine: 1.018 (ref 1.005–1.030)
pH: 7 (ref 5.0–8.0)

## 2022-08-22 LAB — COMPREHENSIVE METABOLIC PANEL
ALT: 19 U/L (ref 0–44)
AST: 26 U/L (ref 15–41)
Albumin: 4.4 g/dL (ref 3.5–5.0)
Alkaline Phosphatase: 76 U/L (ref 38–126)
Anion gap: 7 (ref 5–15)
BUN: 14 mg/dL (ref 6–20)
CO2: 27 mmol/L (ref 22–32)
Calcium: 9.2 mg/dL (ref 8.9–10.3)
Chloride: 106 mmol/L (ref 98–111)
Creatinine, Ser: 0.83 mg/dL (ref 0.44–1.00)
GFR, Estimated: >60 mL/min (ref >60)
Glucose, Bld: 77 mg/dL (ref 70–99)
Potassium: 3.5 mmol/L (ref 3.5–5.1)
Sodium: 140 mmol/L (ref 135–145)
Total Bilirubin: 0.6 mg/dL (ref 0.3–1.2)
Total Protein: 7.7 g/dL (ref 6.5–8.1)

## 2022-08-22 LAB — CBC WITH DIFFERENTIAL/PLATELET
Abs Immature Granulocytes: 0.02 10*3/uL (ref 0.00–0.07)
Basophils Absolute: 0 10*3/uL (ref 0.0–0.1)
Basophils Relative: 0 %
Eosinophils Absolute: 0.2 10*3/uL (ref 0.0–0.5)
Eosinophils Relative: 2 %
HCT: 35.7 % — ABNORMAL LOW (ref 36.0–46.0)
Hemoglobin: 11.3 g/dL — ABNORMAL LOW (ref 12.0–15.0)
Immature Granulocytes: 0 %
Lymphocytes Relative: 36 %
Lymphs Abs: 2.9 10*3/uL (ref 0.7–4.0)
MCH: 28.7 pg (ref 26.0–34.0)
MCHC: 31.7 g/dL (ref 30.0–36.0)
MCV: 90.6 fL (ref 80.0–100.0)
Monocytes Absolute: 0.4 10*3/uL (ref 0.1–1.0)
Monocytes Relative: 5 %
Neutro Abs: 4.5 10*3/uL (ref 1.7–7.7)
Neutrophils Relative %: 57 %
Platelets: 373 10*3/uL (ref 150–400)
RBC: 3.94 MIL/uL (ref 3.87–5.11)
RDW: 12.9 % (ref 11.5–15.5)
WBC: 8 10*3/uL (ref 4.0–10.5)
nRBC: 0 % (ref 0.0–0.2)

## 2022-08-22 NOTE — ED Triage Notes (Signed)
Pt presents via POV with multiple complaints including bilateral hip pain and was told she needed a hip replacement. Pt was told she has a cervical sprain which she had back in March 2023 and the pain has flared up again tonight. Pts main concern is the generalized pain she is having due to all of these problems flaring up at the same time.  Also requesting her earring be removed due to irration in the right ear. Denies fevers, N/V, CP or SOB.

## 2022-08-23 ENCOUNTER — Emergency Department
Admission: EM | Admit: 2022-08-23 | Discharge: 2022-08-23 | Disposition: A | Discharge status: Home / Self Care | Payer: BLUE CROSS/BLUE SHIELD | Attending: Emergency Medicine | Admitting: Emergency Medicine

## 2022-08-23 DIAGNOSIS — G894 Chronic pain syndrome: Secondary | ICD-10-CM

## 2022-08-23 MED ORDER — KETOROLAC TROMETHAMINE 30 MG/ML IJ SOLN
30.0000 mg | Freq: Once | INTRAMUSCULAR | Status: AC
  Administered 2022-08-23: 30 mg via INTRAMUSCULAR
  Filled 2022-08-23: qty 1

## 2022-08-23 MED ORDER — ACETAMINOPHEN 500 MG PO TABS
1000.0000 mg | ORAL_TABLET | Freq: Once | ORAL | Status: AC
  Administered 2022-08-23: 1000 mg via ORAL
  Filled 2022-08-23: qty 2

## 2022-08-23 NOTE — ED Provider Notes (Signed)
[facility] Provider Note    Event Date/Time   First MD Initiated Contact with Patient 08/23/22 0118     (approximate)   History   Hip Pain (/) and Neck Pain   HPI  Diana Galvan is a 56 y.o. female who presents to the ED for evaluation of Hip Pain (/) and Neck Pain   I reviewed orthopedics visit from March.  Seen for chronic trapezius and bilateral hip pain at that time.  Patient presents to the ED for evaluation of chronic pain.  She reports using lidocaine patches with minimal relief of her chronic pain that she has had for multiple years.  No injuries, fevers, recent illnesses .  Physical Exam   Triage Vital Signs: ED Triage Vitals  Enc Vitals Group     BP 08/22/22 2017 (!) 139/93     Pulse Rate 08/22/22 2017 96     Resp 08/22/22 2017 18     Temp 08/22/22 2017 98.3 F (36.8 C)     Temp Source 08/22/22 2017 Oral     SpO2 08/22/22 2017 100 %     Weight 08/22/22 2013 227 lb 1.2 oz (103 kg)     Height 08/22/22 2013 5\' 7"  (1.702 m)     Head Circumference --      Peak Flow --      Pain Score 08/22/22 2016 9     Pain Loc --      Pain Edu? --      Excl. in GC? --     Most recent vital signs: Vitals:   08/22/22 2017 08/23/22 0130  BP: (!) 139/93 124/83  Pulse: 96 64  Resp: 18 16  Temp: 98.3 F (36.8 C) 98.2 F (36.8 C)  SpO2: 100% 99%    General: Awake, no distress.  CV:  Good peripheral perfusion.  Resp:  Normal effort.  Abd:  No distention.  MSK:  No deformity noted.  Neuro:  No focal deficits appreciated. Cranial nerves II through XII intact 5/5 strength and sensation in all 4 extremities Other:     ED Results / Procedures / Treatments   Labs (all labs ordered are listed, but only abnormal results are displayed) Labs Reviewed  URINALYSIS, ROUTINE W REFLEX MICROSCOPIC - Abnormal; Notable for the following components:      Result Value   Color, Urine YELLOW (*)    APPearance CLEAR (*)    Hgb urine dipstick  MODERATE (*)    Bacteria, UA RARE (*)    All other components within normal limits  CBC WITH DIFFERENTIAL/PLATELET - Abnormal; Notable for the following components:   Hemoglobin 11.3 (*)    HCT 35.7 (*)    All other components within normal limits  COMPREHENSIVE METABOLIC PANEL    EKG   RADIOLOGY Plain film of the bilateral hips and pelvis interpreted by me without evidence of fracture. CT cervical spine interpreted by me without evidence of fracture or dislocation.  Official radiology report(s): CT Cervical Spine Wo Contrast  Result Date: 08/22/2022 CLINICAL DATA:  Chronic neck pain EXAM: CT CERVICAL SPINE WITHOUT CONTRAST TECHNIQUE: Multidetector CT imaging of the cervical spine was performed without intravenous contrast. Multiplanar CT image reconstructions were also generated. RADIATION DOSE REDUCTION: This exam was performed according to the departmental dose-optimization program which includes automated exposure control, adjustment of the mA and/or kV according to patient size and/or use of iterative reconstruction technique. COMPARISON:  None Available. FINDINGS: Alignment: Straightening of the cervical spine,  likely positional. Skull base and vertebrae: No acute fracture. No primary bone lesion or focal pathologic process. Soft tissues and spinal canal: No prevertebral fluid or swelling. No visible canal hematoma. Disc levels: Mild degenerative changes at C5-6. Spinal canal is patent. Upper chest: Visualized lung apices are clear. Other: Visualized thyroid is unremarkable. IMPRESSION: Negative cervical spine CT. Electronically Signed   By: Charline Bills M.D.   On: 08/22/2022 21:21   DG Hips Bilat W or Wo Pelvis 3-4 Views  Result Date: 08/22/2022 CLINICAL DATA:  Bilateral hip pain, initial encounter EXAM: DG HIP (WITH OR WITHOUT PELVIS) 3-4V BILAT COMPARISON:  03/23/2022 FINDINGS: Pelvic ring is intact. Degenerative changes of the hips are noted right greater than left. No acute  fracture or dislocation is noted. No soft tissue abnormality is seen. IMPRESSION: Bilateral degenerative changes as described in the hip joints. No acute abnormality noted. Electronically Signed   By: Alcide Clever M.D.   On: 08/22/2022 21:17    PROCEDURES and INTERVENTIONS:  Procedures  Medications  ketorolac (TORADOL) 30 MG/ML injection 30 mg (has no administration in time range)  acetaminophen (TYLENOL) tablet 1,000 mg (has no administration in time range)     IMPRESSION / MDM / ASSESSMENT AND PLAN / ED COURSE  I reviewed the triage vital signs and the nursing notes.  Differential diagnosis includes, but is not limited to, fracture, dislocation, sepsis, cellulitis  {Patient presents with symptoms of an acute illness or injury that is potentially life-threatening.  56 year old presents with chronic pain without evidence of novel features.  We will provide nonnarcotic analgesia and recommend OTC medications for the same.  No indications for further diagnostics.  Consider observation admission for pain control, but ultimately not necessary. Screening of CBC, CMP are essentially normal.  UA without infectious features considering her lack of symptoms.      FINAL CLINICAL IMPRESSION(S) / ED DIAGNOSES   Final diagnoses:  Chronic pain syndrome     Rx / DC Orders   ED Discharge Orders     None        Note:  This document was prepared using Dragon voice recognition software and may include unintentional dictation errors.   Delton Prairie, MD 08/23/22 [phone]

## 2022-08-23 NOTE — Discharge Instructions (Addendum)
Please take Tylenol and ibuprofen/Advil for your pain.  It is safe to take them together, or to alternate them every few hours.  Take up to 1000mg of Tylenol at a time, up to 4 times per day.  Do not take more than 4000 mg of Tylenol in 24 hours.  For ibuprofen, take 400-600 mg, 3 - 4 times per day.  

## 2022-08-23 NOTE — ED Notes (Signed)
Patient discharged to home per MD order. Patient in stable condition, and deemed medically cleared by ED provider for discharge. Discharge instructions reviewed with patient/family using "Teach Back"; verbalized understanding of medication education and administration, and information about follow-up care. Denies further concerns. ° °

## 2022-09-18 IMAGING — DX DG HIP (WITH OR WITHOUT PELVIS) 4+V*L*
4 series · 4 of 4 positions shown · non-contrast
Comparison: No prior left hip radiographs. Correlation is made with
01/16/2022 right hip radiographs.

CLINICAL DATA: Chronic bilateral hip pain

EXAM:
DG HIP (WITH OR WITHOUT PELVIS) 4+V LEFT

[hip ap]
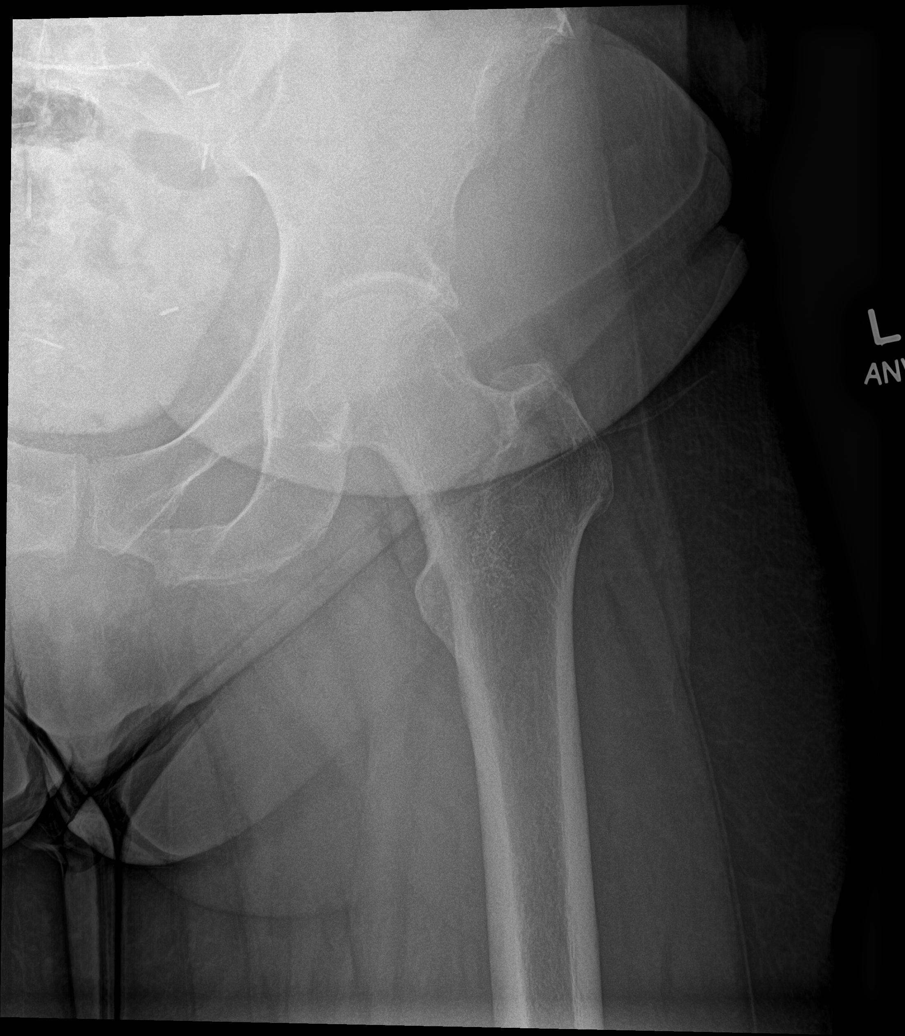

[hip axial]
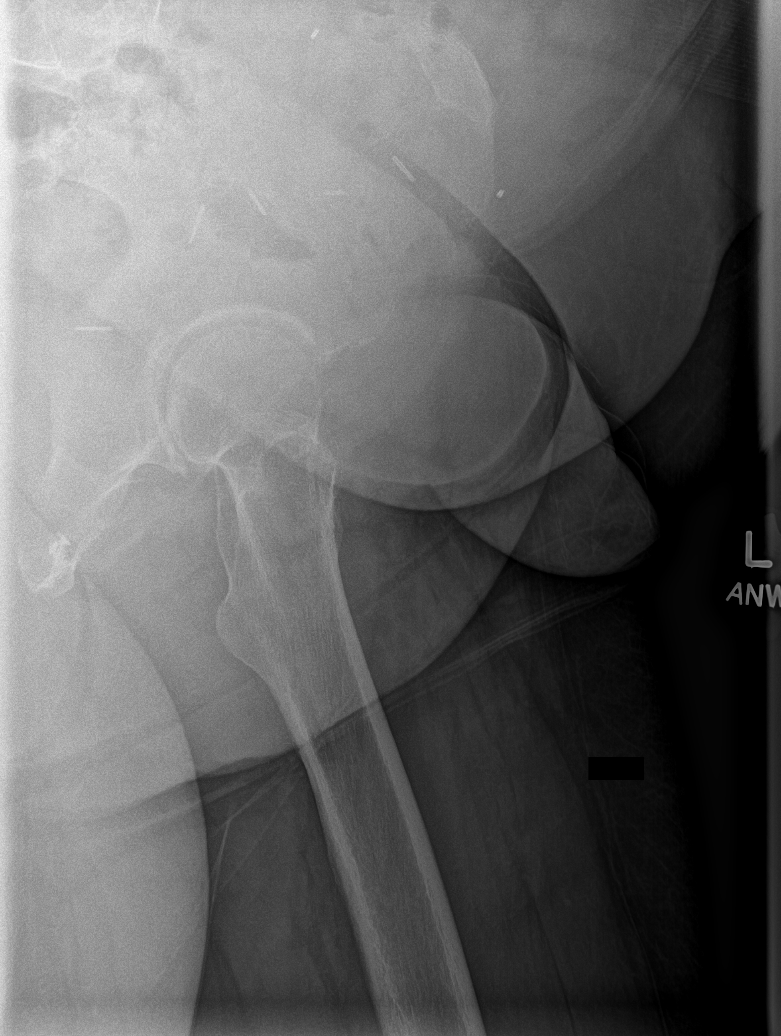

[pelvis ap]
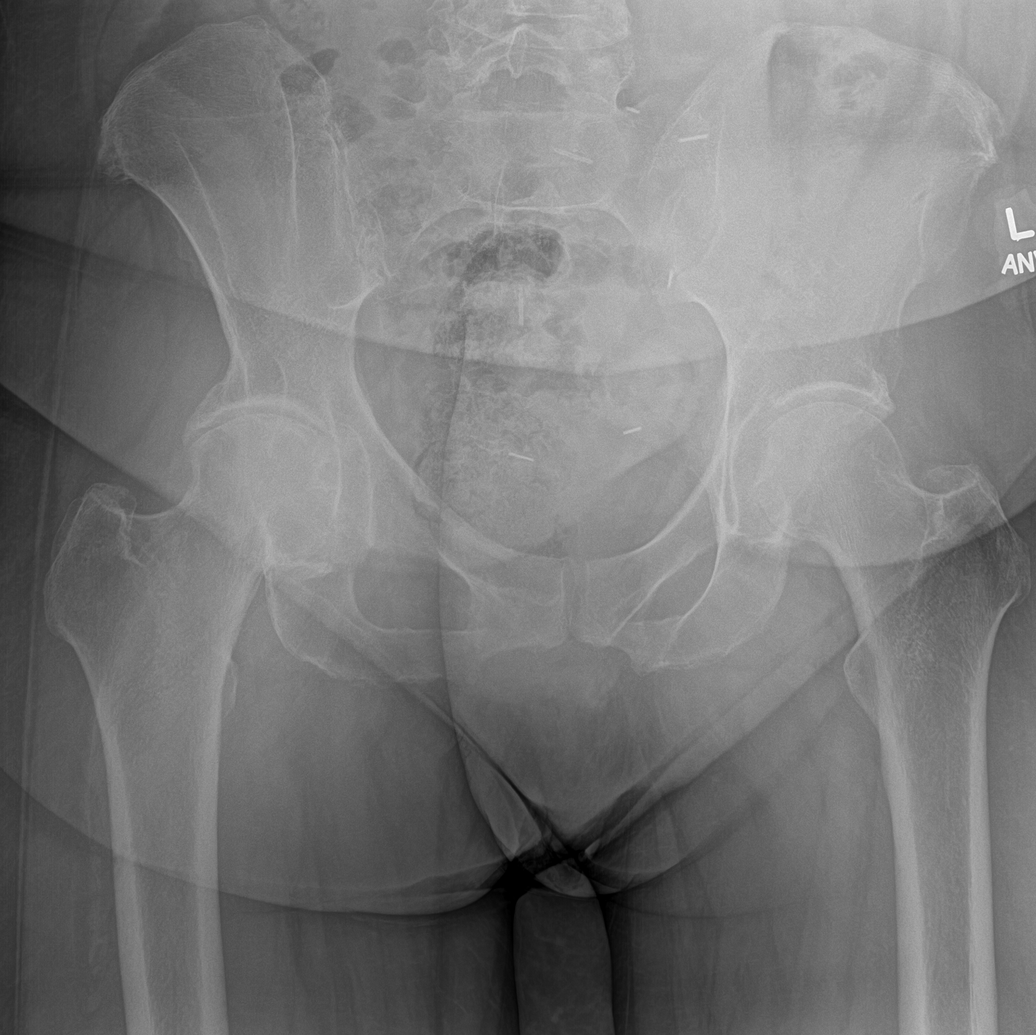

[hip lat]
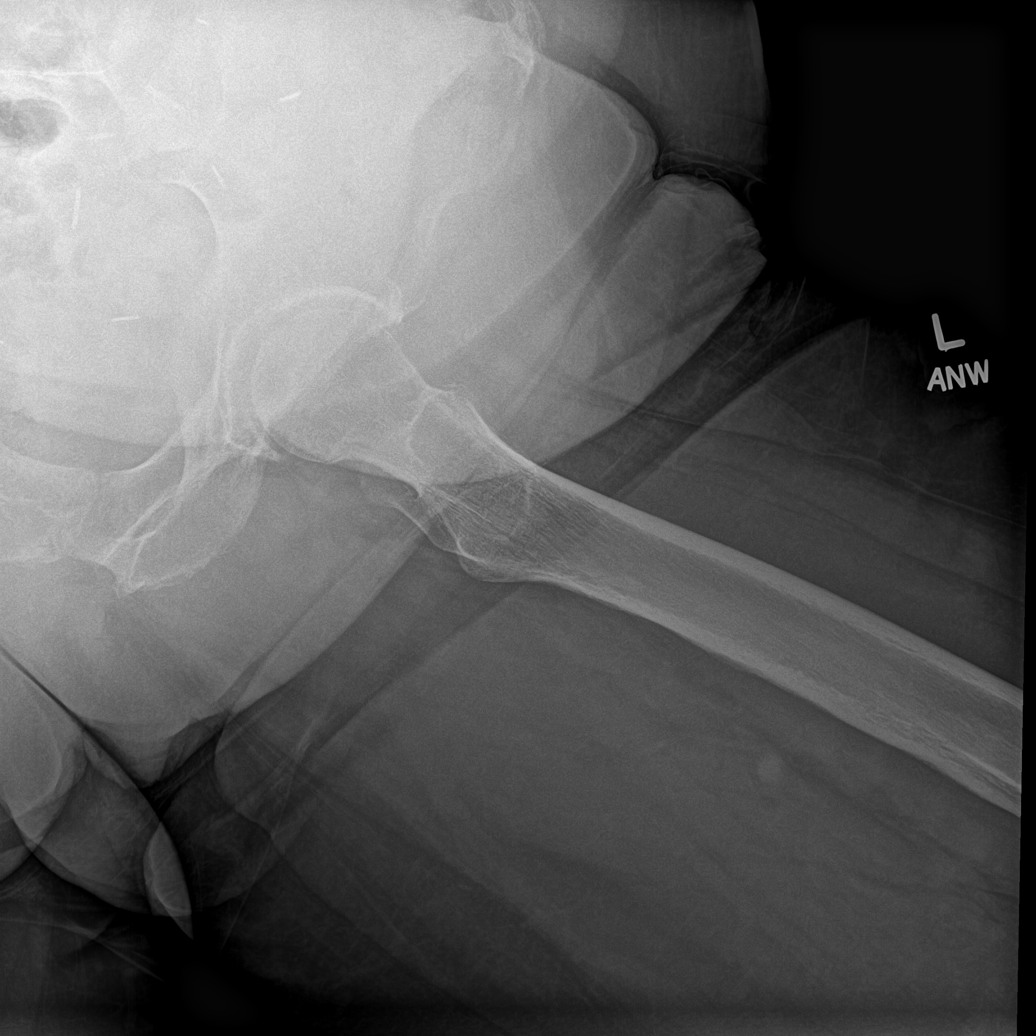

[4 of 4 positions shown; findings below may reference images not displayed]

FINDINGS: No acute fracture or dislocation. Degenerative changes in the right
hip, with the left hip joint relatively preserved. Alignment is
unremarkable. Surgical clips are noted overlying pelvis.
IMPRESSION: No acute fracture or dislocation. No significant degenerative
changes in the left hip.

## 2022-11-28 ENCOUNTER — Telehealth: Payer: BLUE CROSS/BLUE SHIELD | Admitting: Orthopaedic Surgery

## 2022-11-28 NOTE — Telephone Encounter (Signed)
Patient called and stated she is upset that her Ins in not in network and she has questions..please call..[phone]

## 2022-12-08 ENCOUNTER — Telehealth: Payer: Self-pay | Admitting: Orthopaedic Surgery

## 2022-12-08 NOTE — Telephone Encounter (Signed)
Patient states after her PT appt that her appointment was not covered by insurance. Patient states she has new insurance that will start soon. She has paper work that needs to be filled out for her employer to not have points at her job. She states she has [employer] insurance.she/her/hers also states she need a new appt, but not really sure what she really needs. [phone], [phone].

## 2022-12-08 NOTE — Telephone Encounter (Signed)
Scheduled 12/28/2022 11am  Explained to patient that Dr. Steward Drone will not fill out work paperwork as he has not seen her in greater than 6 months. I informed her she can discuss her requests in regards to working conditions with Dr. Steward Drone at next scheduled visit.  Patient understood and no further questions were asked

## 2022-12-28 ENCOUNTER — Ambulatory Visit (INDEPENDENT_AMBULATORY_CARE_PROVIDER_SITE_OTHER): Payer: 59 | Admitting: Orthopaedic Surgery

## 2022-12-28 ENCOUNTER — Telehealth: Payer: Self-pay | Admitting: Orthopaedic Surgery

## 2022-12-28 DIAGNOSIS — M25551 Pain in right hip: Secondary | ICD-10-CM

## 2022-12-28 NOTE — Telephone Encounter (Signed)
Patient states she is running late will be there at 10:40.Marland Kitchen

## 2022-12-28 NOTE — Progress Notes (Signed)
Chief Complaint: neck pain and bilateral hip pain     History of Present Illness:   12/28/2022: Presents today for follow-up of her right hip pain as well as lower back pain.  She did have a lumbar epidural injection with Dr. Alvester Morin which did give her significant relief.  She is here today for further assessment as she is continuing to have groin pain.  Diana Galvan is a 57 y.o. female presents today with upper trapezial as well as bilateral hip pain.  These been going on for several months although the right hip has been significantly aggravated since January of this year.  She initially went to the emergency room and was given an intramuscular injection with little relief.  This continues to awaken her at night.  She states that upper neck pain is not associated with any type of radiation down the arm.  She has been previously using naproxen as well as lidocaine patches.  These have only of limited relief.  She is currently working from home.  She does have a history of being told that she does have some lumbar stenosis.    Surgical History:   None  PMH/PSH/Family History/Social History/Meds/Allergies:    Past Medical History:  Diagnosis Date   Asthma    Coronary artery disease    mild cad in mLAD on coronary CTA   Costochondritis    DDD (degenerative disc disease), lumbar    Depression    Thyroid disease    Thyroid goiter    Past Surgical History:  Procedure Laterality Date   APPENDECTOMY     COLONOSCOPY WITH PROPOFOL N/A 02/03/2020   Procedure: COLONOSCOPY WITH PROPOFOL;  Surgeon: Midge Minium, MD;  Location: [facility] ENDOSCOPY;  Service: Endoscopy;  Laterality: N/A;  Priority 4   THYROIDECTOMY     TUBAL LIGATION     VAGINAL HYSTERECTOMY  10/30/2002   Social History   Socioeconomic History   Marital status: Divorced    Spouse name: Not on file   Number of children: Not on file   Years of education: Not on file   Highest education  level: Not on file  Occupational History   Not on file  Tobacco Use   Smoking status: Some Days    Packs/day: 0.25    Years: 38.00    Total pack years: 9.50    Types: Cigarettes   Smokeless tobacco: Never  Vaping Use   Vaping Use: Never used  Substance and Sexual Activity   Alcohol use: Not Currently   Drug use: No    Types: "Crack" cocaine    Comment: Recovered 2001   Sexual activity: Yes    Partners: Male    Birth control/protection: None  Other Topics Concern   Not on file  Social History Narrative   Not on file   Social Determinants of Health   Financial Resource Strain: Not on file  Food Insecurity: Not on file  Transportation Needs: Not on file  Physical Activity: Not on file  Stress: Not on file  Social Connections: Not on file   Family History  Problem Relation Age of Onset   Mental illness Mother    Heart Problems Mother    Congestive Heart Failure Paternal Aunt    Heart failure Paternal Aunt    Breast cancer Cousin  Heart failure Paternal Grandmother    Allergies  Allergen Reactions   Adhesive [Tape] Other (See Comments)    Blisters and burning   Wool Alcohol [Lanolin] Itching   Current Outpatient Medications  Medication Sig Dispense Refill   albuterol (VENTOLIN HFA) 108 (90 Base) MCG/ACT inhaler Inhale 2 puffs into the lungs every 6 (six) hours as needed for wheezing or shortness of breath. 18 g 2   aspirin EC 81 MG tablet Take 1 tablet (81 mg total) by mouth daily. 90 tablet 3   atorvastatin (LIPITOR) 40 MG tablet Take 1 tablet (40 mg total) by mouth daily. 90 tablet 3   fluticasone (FLONASE) 50 MCG/ACT nasal spray Place 1 spray into both nostrils daily. 16 g 2   Fluticasone-Salmeterol (ADVAIR) 250-50 MCG/DOSE AEPB Inhale 1 puff into the lungs daily. 60 each 1   HYDROcodone-acetaminophen (NORCO) 5-325 MG tablet Take 1 tablet by mouth every 6 (six) hours as needed for moderate pain. 15 tablet 0   ipratropium-albuterol (DUONEB) 0.5-2.5 (3) MG/3ML  SOLN Take 3 mLs by nebulization every 6 (six) hours as needed.     ivabradine (CORLANOR) 7.5 MG TABS tablet Take 2 tablets (15 mg) by mouth 2 hours prior to your Cardiac CT 2 tablet 0   lidocaine (LIDODERM) 5 % Place 1 patch onto the skin daily. Remove & Discard patch within 12 hours or as directed by MD 30 patch 0   metoprolol tartrate (LOPRESSOR) 100 MG tablet Take 1 tablet (100 mg) by mouth 2 hours prior to your Cardiac CT 1 tablet 0   montelukast (SINGULAIR) 10 MG tablet Take 1 tablet (10 mg total) by mouth at bedtime. 30 tablet 1   naproxen (NAPROSYN) 500 MG tablet Take 1 tablet (500 mg total) by mouth 2 (two) times daily with a meal. 14 tablet 0   pantoprazole (PROTONIX) 40 MG tablet Take 1 tablet (40 mg total) by mouth daily. 30 tablet 6   Current Facility-Administered Medications  Medication Dose Route Frequency Provider Last Rate Last Admin   lidocaine (XYLOCAINE) 2 % jelly 1 application  1 application  Urethral Once Billey Co, MD       No results found.  Review of Systems:   A ROS was performed including pertinent positives and negatives as documented in the HPI.  Physical Exam :   Constitutional: NAD and appears stated age Neurological: Alert and oriented Psych: Appropriate affect and cooperative There were no vitals taken for this visit.   Comprehensive Musculoskeletal Exam:    Inspection Right Left  Skin No atrophy or gross abnormalities appreciated No atrophy or gross abnormalities appreciated  Palpation    Tenderness None None  Crepitus None None  Range of Motion    Flexion (passive) 120 120  Extension 30 30  IR 30 with pain in groin 30  ER 45 with groin pain 45  Strength    Flexion  5/5 5/5  Extension 5/5 5/5  Special Tests    FABER Negative Negative  FADIR Negative Negative  ER Lag/Capsular Insufficiency Negative Negative  Instability Negative Negative  Sacroiliac pain Negative  Negative   Instability    Generalized Laxity No No  Neurologic     sciatic, femoral, obturator nerves intact to light sensation  Vascular/Lymphatic    DP pulse 2+ 2+  Lumbar Exam    Patient has symmetric lumbar range of motion with negative pain referral to hip  Pain about the right trapezial muscles involving the cervical spine. Tenderness palpation about the  right SI joint  Imaging:   Xray (AP pelvis, bilateral hips 2 views): Right worse than left osteoarthritis   I personally reviewed and interpreted the radiographs.   Assessment:   57 year old female with right worse than left hip osteoarthritis as well as lower back pain.  I did describe that these are likely completely 2 different issues although the right hip has very significant pain with gentle range of motion.  She is very concerned as this hip pain and groin pain has specifically been limiting her ability to keep a job.  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  At this time she is hoping to discuss more definitive fix.  I did describe that ultimately I do believe her right femoral acetabular joint is a significant source of pain.  I do believe that she may ultimately be a candidate for right hip arthroplasty given her more advanced changes on x-ray particularly on the right side.  At this time I would like for her to get a referral from my partner Dr. Ninfa Linden to see if she would be a candidate for hip arthroplasty. Plan :    -Plan for referral to Dr. Ninfa Linden for discussion of hip arthroplasty candidacy     I personally saw and evaluated the patient, and participated in the management and treatment plan.  Vanetta Mulders, MD Attending Physician, Orthopedic Surgery  This document was dictated using Dragon voice recognition software. A reasonable attempt at proof reading has been made to minimize errors.

## 2022-12-30 ENCOUNTER — Telehealth: Payer: Self-pay | Admitting: Orthopaedic Surgery

## 2022-12-30 ENCOUNTER — Encounter (HOSPITAL_BASED_OUTPATIENT_CLINIC_OR_DEPARTMENT_OTHER): Payer: Self-pay | Admitting: Orthopaedic Surgery

## 2022-12-30 NOTE — Telephone Encounter (Signed)
Noted for [employer] ?

## 2022-12-30 NOTE — Telephone Encounter (Signed)
Patient has submitted FMLA forms. Please advise work status and provide note. Thank you.

## 2023-01-03 ENCOUNTER — Encounter: Payer: Self-pay | Admitting: Internal Medicine

## 2023-01-03 ENCOUNTER — Other Ambulatory Visit: Payer: Self-pay

## 2023-01-03 ENCOUNTER — Ambulatory Visit (INDEPENDENT_AMBULATORY_CARE_PROVIDER_SITE_OTHER): Payer: 59 | Admitting: Internal Medicine

## 2023-01-03 VITALS — BP 128/84 | HR 81 | Temp 98.3°F | Resp 18 | Ht 67.0 in | Wt 246.6 lb

## 2023-01-03 DIAGNOSIS — Z9109 Other allergy status, other than to drugs and biological substances: Secondary | ICD-10-CM | POA: Diagnosis not present

## 2023-01-03 DIAGNOSIS — Z1159 Encounter for screening for other viral diseases: Secondary | ICD-10-CM

## 2023-01-03 DIAGNOSIS — D649 Anemia, unspecified: Secondary | ICD-10-CM

## 2023-01-03 DIAGNOSIS — I2510 Atherosclerotic heart disease of native coronary artery without angina pectoris: Secondary | ICD-10-CM

## 2023-01-03 DIAGNOSIS — J45909 Unspecified asthma, uncomplicated: Secondary | ICD-10-CM | POA: Diagnosis not present

## 2023-01-03 DIAGNOSIS — Z1231 Encounter for screening mammogram for malignant neoplasm of breast: Secondary | ICD-10-CM

## 2023-01-03 MED ORDER — FLUTICASONE PROPIONATE 50 MCG/ACT NA SUSP: 1.0000 | Freq: Every day | NASAL | 2 refills | Status: DC

## 2023-01-03 MED ORDER — ALBUTEROL SULFATE HFA 108 (90 BASE) MCG/ACT IN AERS: 2.0000 | INHALATION_SPRAY | Freq: Four times a day (QID) | RESPIRATORY_TRACT | 2 refills | Status: DC | PRN

## 2023-01-03 MED ORDER — MONTELUKAST SODIUM 10 MG PO TABS: 10.0000 mg | ORAL_TABLET | Freq: Every day | ORAL | 1 refills | Status: DC

## 2023-01-03 NOTE — Patient Instructions (Signed)
It was great seeing you today!  Plan discussed at today's visit: -Blood work ordered today, results will be uploaded to [facility].  -Mammogram ordered, call number on card to schedule  -Breztri sample given, use once daily and Albuterol as needed  Follow up in: as needed for physical   Take care and let us know if you have any questions or concerns prior to your next visit.  Dr. Rosana Berger

## 2023-01-03 NOTE — Progress Notes (Addendum)
New Patient Office Visit  Subjective    Patient ID: Diana Galvan, female    DOB: 03/01/1966  Age: 57 y.o. MRN: [id number]  CC:  Chief Complaint  Patient presents with   Establish Care   Asthma    HPI Diana Galvan presents to establish care. 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   Asthma/Allergies:  -Asthma status: uncontrolled -Current Treatments: Advair, Albuterol PRN, Duoneb treatments PRN, Singulair 10 mg, Flonase. Is not using the Advair because headaches  -Satisfied with current treatment?: no -Albuterol/rescue inhaler frequency: once a week  -Dyspnea frequency: yes -Wheezing frequency: yes -Cough frequency: cough episodes for 1 month - sometimes productive  -Current upper respiratory symptoms: yes -Visits to ER or Urgent Care in past year: no -Pneumovax: Not up to Date -Influenza: Up to Date  CAD/HLD: -Medications: Had been on Lipitor 40 mg, aspirin 81 mg by Cardiology but patient reports that she is not taking anything currently. Had issues with her right hip (planning THA) and was told that the statin could be contributing to pain so she is not on anything currently  -Last lipid panel: Lipid Panel     Component Value Date/Time   CHOL 141 02/17/2022 1427   CHOL 176 05/01/2019 1049   TRIG 44 02/17/2022 1427   HDL 54 02/17/2022 1427   HDL 51 05/01/2019 1049   CHOLHDL 2.6 02/17/2022 1427   VLDL 9 02/17/2022 1427   LDLCALC 78 02/17/2022 1427   LDLCALC 109 (H) 05/01/2019 1049   LABVLDL 16 05/01/2019 1049   Health Maintenance: -Blood work due -Mammogram due -Colonoscopy 02/03/20 - follow up in 5 years    Outpatient Encounter Medications as of 01/03/2023  Medication Sig   albuterol (VENTOLIN HFA) 108 (90 Base) MCG/ACT inhaler Inhale 2 puffs into the lungs every 6 (six) hours as needed for wheezing or shortness of breath.   aspirin EC 81 MG tablet Take 1 tablet (81 mg total) by mouth daily.   fluticasone (FLONASE) 50 MCG/ACT  nasal spray Place 1 spray into both nostrils daily.   Fluticasone-Salmeterol (ADVAIR) 250-50 MCG/DOSE AEPB Inhale 1 puff into the lungs daily.   ipratropium-albuterol (DUONEB) 0.5-2.5 (3) MG/3ML SOLN Take 3 mLs by nebulization every 6 (six) hours as needed.   lidocaine (LIDODERM) 5 % Place 1 patch onto the skin daily. Remove & Discard patch within 12 hours or as directed by MD   naproxen (NAPROSYN) 500 MG tablet Take 1 tablet (500 mg total) by mouth 2 (two) times daily with a meal.   atorvastatin (LIPITOR) 40 MG tablet Take 1 tablet (40 mg total) by mouth daily. (Patient not taking: Reported on 01/03/2023)   HYDROcodone-acetaminophen (NORCO) 5-325 MG tablet Take 1 tablet by mouth every 6 (six) hours as needed for moderate pain. (Patient not taking: Reported on 01/03/2023)   ivabradine (CORLANOR) 7.5 MG TABS tablet Take 2 tablets (15 mg) by mouth 2 hours prior to your Cardiac CT (Patient not taking: Reported on 01/03/2023)   metoprolol tartrate (LOPRESSOR) 100 MG tablet Take 1 tablet (100 mg) by mouth 2 hours prior to your Cardiac CT (Patient not taking: Reported on 01/03/2023)   montelukast (SINGULAIR) 10 MG tablet Take 1 tablet (10 mg total) by mouth at bedtime. (Patient not taking: Reported on 01/03/2023)   pantoprazole (PROTONIX) 40 MG tablet Take 1 tablet (40 mg total) by mouth daily. (Patient not taking: Reported on 01/03/2023)   Facility-Administered Encounter Medications as of 01/03/2023  Medication  lidocaine (XYLOCAINE) 2 % jelly 1 application    Past Medical History:  Diagnosis Date   Asthma    Coronary artery disease    mild cad in mLAD on coronary CTA   Costochondritis    DDD (degenerative disc disease), lumbar    Depression    Thyroid disease    Thyroid goiter     Past Surgical History:  Procedure Laterality Date   APPENDECTOMY     COLONOSCOPY WITH PROPOFOL N/A 02/03/2020   Procedure: COLONOSCOPY WITH PROPOFOL;  Surgeon: Midge Minium, MD;  Location: [facility] ENDOSCOPY;  Service: Endoscopy;   Laterality: N/A;  Priority 4   THYROIDECTOMY     TUBAL LIGATION     VAGINAL HYSTERECTOMY  10/30/2002    Family History  Problem Relation Age of Onset   Hypertension Mother    Hyperlipidemia Mother    Mental illness Mother    Heart Problems Mother    Asthma Father    Heart disease Father    Congestive Heart Failure Paternal Aunt    Heart failure Paternal Aunt    Heart failure Paternal Grandmother    Breast cancer Cousin     Social History   Socioeconomic History   Marital status: Divorced    Spouse name: Not on file   Number of children: 0   Years of education: Not on file   Highest education level: Not on file  Occupational History   Not on file  Tobacco Use   Smoking status: Some Days    Packs/day: 0.25    Years: 38.00    Total pack years: 9.50    Types: Cigarettes   Smokeless tobacco: Never  Vaping Use   Vaping Use: Former   Substances: Nicotine  Substance and Sexual Activity   Alcohol use: Not Currently   Drug use: Not Currently    Types: "Crack" cocaine    Comment: Recovered 2001   Sexual activity: Yes    Partners: Male    Birth control/protection: None  Other Topics Concern   Not on file  Social History Narrative   Not on file   Social Determinants of Health   Financial Resource Strain: Not on file  Food Insecurity: Not on file  Transportation Needs: Not on file  Physical Activity: Not on file  Stress: Not on file  Social Connections: Not on file  Intimate Partner Violence: Not on file    Review of Systems  Constitutional:  Negative for chills and fever.  Respiratory:  Positive for cough, sputum production and shortness of breath.   Musculoskeletal:  Positive for joint pain.       Objective    Pulse 81   Temp 98.3 F (36.8 C) (Oral)   Resp 18   Ht 5\' 7"  (1.702 m)   Wt 246 lb 9.6 oz (111.9 kg)   SpO2 96%   BMI 38.62 kg/m   Physical Exam Constitutional:      Appearance: Normal appearance.  HENT:     Head: Normocephalic and  atraumatic.  Eyes:     Conjunctiva/sclera: Conjunctivae normal.  Cardiovascular:     Rate and Rhythm: Normal rate and regular rhythm.  Pulmonary:     Effort: Pulmonary effort is normal.     Comments: Poor air movement throughout  Skin:    General: Skin is warm and dry.  Neurological:     General: No focal deficit present.     Mental Status: She is alert. Mental status is at baseline.  Psychiatric:  Mood and Affect: Mood normal.        Behavior: Behavior normal.         Assessment & Plan:   1. Asthma, unspecified asthma severity, unspecified whether complicated, unspecified whether persistent: Not well controlled, not on a maintenance inhaler and only using Albuterol once a week despite symptoms. Refill Albuterol, DME order for new breathing machine placed. Patient concerned about medication cost, sample of Breztri provided to use once a day.   - CBC w/Diff/Platelet - COMPLETE METABOLIC PANEL WITH GFR - albuterol (VENTOLIN HFA) 108 (90 Base) MCG/ACT inhaler; Inhale 2 puffs into the lungs every 6 (six) hours as needed for wheezing or shortness of breath.  Dispense: 18 g; Refill: 2 - For home use only DME Other see comment  2. Environmental allergies: Singulair and Flonase refilled today.   - CBC w/Diff/Platelet - COMPLETE METABOLIC PANEL WITH GFR - fluticasone (FLONASE) 50 MCG/ACT nasal spray; Place 1 spray into both nostrils daily.  Dispense: 16 g; Refill: 2 - montelukast (SINGULAIR) 10 MG tablet; Take 1 tablet (10 mg total) by mouth at bedtime.  Dispense: 30 tablet; Refill: 1  3. Coronary artery disease involving native heart without angina pectoris, unspecified vessel or lesion type: Had been following with Cardiology, note from 02/14/22 reviewed. Will repeat labs, not currently on a statin or aspirin.   - CBC w/Diff/Platelet - COMPLETE METABOLIC PANEL WITH GFR - Lipid Profile  4. Encounter for screening mammogram for malignant neoplasm of breast: Mammogram ordered.    - MM Digital Screening; Future  5. Need for hepatitis C screening test: Screening ordered.   - Hepatitis C Antibody    Return in about 6 months (around 07/04/2023) for CPE .   Margarita Mail, DO

## 2023-01-03 NOTE — Addendum Note (Signed)
Addended by: Teodora Medici on: 01/03/2023 10:07 AM   Modules accepted: Orders

## 2023-01-04 ENCOUNTER — Telehealth: Payer: Self-pay

## 2023-01-04 NOTE — Telephone Encounter (Signed)
Pt called triage stating she needs her work paperwork filled out. Stated she doesn't have $25 dollars and needs the paperwork asap. She is about to lose her job due to this. Please call her back to discuss

## 2023-01-04 NOTE — Telephone Encounter (Signed)
Patient is stating unable to pay $25 form fee to [employer], she's asking if you will complete forms since she cannot pay.

## 2023-01-04 NOTE — Telephone Encounter (Signed)
Forms completed and ready for pick up at [facility] front desk

## 2023-01-05 NOTE — Addendum Note (Signed)
Addended by: Teodora Medici on: 01/05/2023 11:39 AM   Modules accepted: Orders

## 2023-01-06 ENCOUNTER — Ambulatory Visit: Payer: Self-pay

## 2023-01-06 LAB — COMPLETE METABOLIC PANEL WITH GFR
AG Ratio: 1.9 (calc) (ref 1.0–2.5)
ALT: 14 U/L (ref 6–29)
AST: 21 U/L (ref 10–35)
Albumin: 4.5 g/dL (ref 3.6–5.1)
Alkaline phosphatase (APISO): 78 U/L (ref 37–153)
BUN: 12 mg/dL (ref 7–25)
CO2: 26 mmol/L (ref 20–32)
Calcium: 9.2 mg/dL (ref 8.6–10.4)
Chloride: 103 mmol/L (ref 98–110)
Creat: 0.68 mg/dL (ref 0.50–1.03)
Globulin: 2.4 g/dL (calc) (ref 1.9–3.7)
Glucose, Bld: 87 mg/dL (ref 65–99)
Potassium: 4 mmol/L (ref 3.5–5.3)
Sodium: 141 mmol/L (ref 135–146)
Total Bilirubin: 0.3 mg/dL (ref 0.2–1.2)
Total Protein: 6.9 g/dL (ref 6.1–8.1)
eGFR: 102 mL/min/{1.73_m2} (ref >=60)

## 2023-01-06 LAB — LIPID PANEL
Cholesterol: 167 mg/dL (ref <200)
HDL: 61 mg/dL (ref >=50)
LDL Cholesterol (Calc): 95 mg/dL (calc)
Non-HDL Cholesterol (Calc): 106 mg/dL (calc) (ref <130)
Total CHOL/HDL Ratio: 2.7 (calc) (ref <5.0)
Triglycerides: 39 mg/dL (ref <150)

## 2023-01-06 LAB — CBC WITH DIFFERENTIAL/PLATELET
Absolute Monocytes: 385 cells/uL (ref 200–950)
Basophils Absolute: 28 cells/uL (ref 0–200)
Basophils Relative: 0.4 %
Eosinophils Absolute: 168 cells/uL (ref 15–500)
Eosinophils Relative: 2.4 %
HCT: 32.8 % — ABNORMAL LOW (ref 35.0–45.0)
Hemoglobin: 10.8 g/dL — ABNORMAL LOW (ref 11.7–15.5)
Lymphs Abs: 2730 cells/uL (ref 850–3900)
MCH: 29.3 pg (ref 27.0–33.0)
MCHC: 32.9 g/dL (ref 32.0–36.0)
MCV: 88.9 fL (ref 80.0–100.0)
MPV: 9.9 fL (ref 7.5–12.5)
Monocytes Relative: 5.5 %
Neutro Abs: 3689 cells/uL (ref 1500–7800)
Neutrophils Relative %: 52.7 %
Platelets: 396 10*3/uL (ref 140–400)
RBC: 3.69 10*6/uL — ABNORMAL LOW (ref 3.80–5.10)
RDW: 12.7 % (ref 11.0–15.0)
Total Lymphocyte: 39 %
WBC: 7 10*3/uL (ref 3.8–10.8)

## 2023-01-06 LAB — TEST AUTHORIZATION

## 2023-01-06 LAB — IRON,TIBC AND FERRITIN PANEL
%SAT: 12 % (calc) — ABNORMAL LOW (ref 16–45)
Ferritin: 85 ng/mL (ref 16–232)
Iron: 46 ug/dL (ref 45–160)
TIBC: 400 mcg/dL (calc) (ref 250–450)

## 2023-01-06 LAB — HEPATITIS C ANTIBODY: Hepatitis C Ab: NONREACTIVE

## 2023-01-06 NOTE — Telephone Encounter (Signed)
  Chief Complaint: Fall - right arm pain Symptoms: pain Frequency: Monday night Pertinent Negatives: Patient denies  Disposition: [] ED /[] Urgent Care (no appt availability in office) / [] Appointment(In office/virtual)/ []  [facility]/ [] Home Care/ [x] Refused Recommended Disposition /[] [facility]/ []  Follow-up with PCP Additional Notes: PT states that she fell Monday before her office visit, but failed to mention it to provider. Pt states that she fell off the chair onto the floor on her buttocks. Pt states that she hit her back very hard on the corner of the chair between her spine and shoulder blade. Pt is now experiencing, numbness, hotness, coldness, and pain of 20/10. Pt does not have enough money to come in for an office visit.   Please advise.  Reason for Disposition  MILD weakness (i.e., does not interfere with ability to work, go to school, normal activities)  (Exception: Mild weakness is a chronic symptom.)  Answer Assessment - Initial Assessment Questions 1. MECHANISM: "How did the fall happen?"     Monday 2.DOMESTIC VIOLENCE AND ELDER ABUSE SCREENING: "Did you fall because someone pushed you or tried to hurt you?" If Yes, ask: "Are you safe now?"     no 3. ONSET: "When did the fall happen?" (e.g., minutes, hours, or days ago)     [place] [address] out of chair 4. LOCATION: "What part of the body hit the ground?" (e.g., back, buttocks, head, hips, knees, hands, head, stomach)     Behind hit the ground. Hurt her back on edge of chair between spine and shoulder blade 5. INJURY: "Did you hurt (injure) yourself when you fell?" If Yes, ask: "What did you injure? Tell me more about this?" (e.g., body area; type of injury; pain severity)"     Yes - right arm 6. PAIN: "Is there any pain?" If Yes, ask: "How bad is the pain?" (e.g., Scale 1-10; or mild,  moderate, severe)   - NONE (0): No pain   - MILD (1-3): Doesn't interfere with normal activities    - MODERATE (4-7):  Interferes with normal activities or awakens from sleep    - SEVERE (8-10): Excruciating pain, unable to do any normal activities      severe 7. SIZE: For cuts, bruises, or swelling, ask: "How large is it?" (e.g., inches or centimeters)      none 8. PREGNANCY: "Is there any chance you are pregnant?" "When was your last menstrual period?"      9. OTHER SYMPTOMS: "Do you have any other symptoms?" (e.g., dizziness, fever, weakness; new onset or worsening).      Numbness, then hotness and then cool. Then pain 10. CAUSE: "What do you think caused the fall (or falling)?" (e.g., tripped, dizzy spell)       fall  Protocols used: Falls and [facility]

## 2023-01-10 NOTE — Telephone Encounter (Signed)
Patient left detailed vm that we could no do anything for her without an evaluation.  I did recommend tylenol/ibuprofen for pain and ice.

## 2023-01-16 ENCOUNTER — Ambulatory Visit (INDEPENDENT_AMBULATORY_CARE_PROVIDER_SITE_OTHER): Payer: 59 | Admitting: Orthopaedic Surgery

## 2023-01-16 ENCOUNTER — Ambulatory Visit (LOCAL_COMMUNITY_HEALTH_CENTER): Payer: 59

## 2023-01-16 VITALS — Ht 67.0 in | Wt 249.0 lb

## 2023-01-16 DIAGNOSIS — M1611 Unilateral primary osteoarthritis, right hip: Secondary | ICD-10-CM

## 2023-01-16 DIAGNOSIS — M25551 Pain in right hip: Secondary | ICD-10-CM

## 2023-01-16 DIAGNOSIS — Z23 Encounter for immunization: Secondary | ICD-10-CM

## 2023-01-16 DIAGNOSIS — Z719 Counseling, unspecified: Secondary | ICD-10-CM

## 2023-01-16 NOTE — Progress Notes (Signed)
The patient is a 57 year old female that I am seeing for the first time but she is referred by one of my partners Dr. Sammuel Hines to evaluate and treat known severe arthritis of her right hip.  She has been having hip pain for many years now.  It has gotten worse over the last few years and even last few weeks.  At this point her right hip pain is daily and it is detrimentally affecting her mobility, her quality of life and actives daily living.  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  She has tried anti-inflammatories.  She has had SI joint injections as well.  She is work on weight loss.  Her BMI is 39.  She is not a diabetic.  She is not on blood thinning medications either.  I was able to review all of her notes within epic including her past medical history and medications in the past.  She does walk with pain in the groin on the right side but also some on the left side.  She has slight valgus malalignment of both her knees.  I was able to lay her in a supine position and she does have larger thighs but they are not prohibitive for anterior hip replacement surgery.  She has a hard time getting up from a laying position due to the pain in her right hip.  There is limitations in range of motion with the right hip being stiff and pain in the groin on range of motion of the right hip.  There is pain on the left hip as well but I believe some of this is radiating from the right hip.  X-rays on the canopy system were reviewed with her.  The right hip has severe arthritis with para-articular osteophytes and cystic changes as well as an irregular femoral head and joint space narrowing.  The left hip looks much better and has minimal if at all arthritic changes.  I explained in detail in length what is going on with her right hip.  I showed her hip replacement model and  went over her x-rays with her.  We discussed the risk and benefits of the surgery and what to expect from an intraoperative and postoperative course.  She would likely need to be out of work 6 to 8 weeks.  She lives alone and has 3 pets which her landlord can take care of her pets.  She would need to have someone at least stay with her at home for 2 to 3 days when she is home in order to make sure she is mobilizing well.  All questions and concerns were answered and addressed.  She is interested in having surgery scheduled for her right hip.

## 2023-01-16 NOTE — Progress Notes (Signed)
Patient seen in nurse clinic.  Patient reports having asthma and receiving Pneumococcal vaccine every 5 years.  Patient does not recall which pneumococcal vaccine she has received in the past.  Consult with Clance Boll, RN and Amy Widderich, RN  - patient to receive Prevnar 20.  VIS provided. Prevnar 20 gien IM in left deltoid.  Tolerated well. [facility] updated and copy provided.

## 2023-02-17 ENCOUNTER — Ambulatory Visit (INDEPENDENT_AMBULATORY_CARE_PROVIDER_SITE_OTHER): Payer: 59 | Admitting: Family Medicine

## 2023-02-17 ENCOUNTER — Encounter: Payer: Self-pay | Admitting: Family Medicine

## 2023-02-17 ENCOUNTER — Ambulatory Visit: Payer: Self-pay

## 2023-02-17 VITALS — BP 110/68 | HR 138 | Temp 98.0°F | Resp 16 | Ht 67.0 in | Wt 239.9 lb

## 2023-02-17 DIAGNOSIS — Z9109 Other allergy status, other than to drugs and biological substances: Secondary | ICD-10-CM | POA: Diagnosis not present

## 2023-02-17 DIAGNOSIS — R21 Rash and other nonspecific skin eruption: Secondary | ICD-10-CM | POA: Diagnosis not present

## 2023-02-17 DIAGNOSIS — J45901 Unspecified asthma with (acute) exacerbation: Secondary | ICD-10-CM

## 2023-02-17 MED ORDER — MONTELUKAST SODIUM 10 MG PO TABS: 10.0000 mg | ORAL_TABLET | Freq: Every day | ORAL | 3 refills | Status: DC

## 2023-02-17 MED ORDER — FLUTICASONE PROPIONATE 50 MCG/ACT NA SUSP: 1.0000 | Freq: Every day | NASAL | 2 refills | Status: DC

## 2023-02-17 MED ORDER — CETIRIZINE HCL 10 MG PO TABS: 10.0000 mg | ORAL_TABLET | Freq: Every day | ORAL | 11 refills | Status: DC

## 2023-02-17 MED ORDER — PREDNISONE 20 MG PO TABS: 40.0000 mg | ORAL_TABLET | Freq: Every day | ORAL | 0 refills | Status: AC

## 2023-02-17 MED ORDER — ALBUTEROL SULFATE HFA 108 (90 BASE) MCG/ACT IN AERS: 2.0000 | INHALATION_SPRAY | Freq: Four times a day (QID) | RESPIRATORY_TRACT | 2 refills | Status: DC | PRN

## 2023-02-17 MED ORDER — BUDESONIDE-FORMOTEROL FUMARATE 160-4.5 MCG/ACT IN AERO: 2.0000 | INHALATION_SPRAY | Freq: Two times a day (BID) | RESPIRATORY_TRACT | 3 refills | Status: DC
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
## 2023-02-17 NOTE — Telephone Encounter (Signed)
Chief Complaint: Occasional short of breath, sinus drainage, rash on arms Symptoms: above Frequency: Rash today, Sob  - asthma, intermittent  ongoing, Sinus drainage  - weds - mostly resolved. Pertinent Negatives: Patient denies Fever, facial swelling current SOB Disposition: '[]'$ ED /'[]'$ Urgent Care (no appt availability in office) / '[x]'$ Appointment(In office/virtual)/ '[]'$  [facility]/ '[]'$ Home Care/ '[]'$ Refused Recommended Disposition /'[]'$ [place]/ '[]'$  Follow-up with PCP Additional Notes: PT called with multiple complaints.  PT states that she has been SOB from time to time. She takes breathing treatments which seem to help. She has asthma. Currently no SOB. Her nose was running a lot earlier this week. This has mostly resolved. Her voice sounds a bit "froggy". Pt thinks this sinus/breathing issue may be from working with the public, or something in her air vents in her apartment. Pt just noticed a rash on her arms. Both arms many small bumps - a little itchy. PT also states that she is upset that her discontinued medications have been removed from her chart.      Reason for Disposition  Mild localized rash  Answer Assessment - Initial Assessment Questions 1. APPEARANCE of RASH: "Describe the rash."      Both arms - red bumps 2. LOCATION: "Where is the rash located?"      Both arms 3. NUMBER: "How many spots are there?"      many 4. SIZE: "How big are the spots?" (Inches, centimeters or compare to size of a coin)      Tiny bumps 5. ONSET: "When did the rash start?"      Unsure - just noticed 6. ITCHING: "Does the rash itch?" If Yes, ask: "How bad is the itch?"  (Scale 0-10; or none, mild, moderate, severe)     yes 7. PAIN: "Does the rash hurt?" If Yes, ask: "How bad is the pain?"  (Scale 0-10; or none, mild, moderate, severe)    - NONE (0): no pain    - MILD (1-3): doesn't interfere with normal activities     - MODERATE (4-7): interferes with normal activities or  awakens from sleep     - SEVERE (8-10): excruciating pain, unable to do any normal activities     none 8. OTHER SYMPTOMS: "Do you have any other symptoms?" (e.g., fever)     no  Answer Assessment - Initial Assessment Questions 1. LOCATION: "Where does it hurt?"      No pain 2. ONSET: "When did the sinus pain start?"  (e.g., hours, days)       3. SEVERITY: "How bad is the pain?"   (Scale 1-10; mild, moderate or severe)   - MILD (1-3): doesn't interfere with normal activities    - MODERATE (4-7): interferes with normal activities (e.g., work or school) or awakens from sleep   - SEVERE (8-10): excruciating pain and patient unable to do any normal activities         4. RECURRENT SYMPTOM: "Have you ever had sinus problems before?" If Yes, ask: "When was the last time?" and "What happened that time?"       5. NASAL CONGESTION: "Is the nose blocked?" If Yes, ask: "Can you open it or must you breathe through your mouth?"     Running 6. NASAL DISCHARGE: "Do you have discharge from your nose?" If so ask, "What color?"     clear 7. FEVER: "Do you have a fever?" If Yes, ask: "What is it, how was it measured, and when did it start?"  no 8. OTHER SYMPTOMS: "Do you have any other symptoms?" (e.g., sore throat, cough, earache, difficulty breathing)     Froggy sounding  Answer Assessment - Initial Assessment Questions 1. RESPIRATORY STATUS: "Describe your breathing?" (e.g., wheezing, shortness of breath, unable to speak, severe coughing)      Time to time SOB 2. ONSET: "When did this breathing problem begin?"      Years 3. PATTERN "Does the difficult breathing come and go, or has it been constant since it started?"      Comes and goes 4. SEVERITY: "How bad is your breathing?" (e.g., mild, moderate, severe)    - MILD: No SOB at rest, mild SOB with walking, speaks normally in sentences, can lie down, no retractions, pulse < 100.    - MODERATE: SOB at rest, SOB with minimal exertion and prefers  to sit, cannot lie down flat, speaks in phrases, mild retractions, audible wheezing, pulse 100-120.    - SEVERE: Very SOB at rest, speaks in single words, struggling to breathe, sitting hunched forward, retractions, pulse > 120      Currently ok 5. RECURRENT SYMPTOM: "Have you had difficulty breathing before?" If Yes, ask: "When was the last time?" and "What happened that time?"      yes 6. CARDIAC HISTORY: "Do you have any history of heart disease?" (e.g., heart attack, angina, bypass surgery, angioplasty)      Sees cardiologist 7. LUNG HISTORY: "Do you have any history of lung disease?"  (e.g., pulmonary embolus, asthma, emphysema)     Asthma 8. CAUSE: "What do you think is causing the breathing problem?"      Asthma - URI 9. OTHER SYMPTOMS: "Do you have any other symptoms? (e.g., dizziness, runny nose, cough, chest pain, fever)     Runny nose 10. O2 SATURATION MONITOR:  "Do you use an oxygen saturation monitor (pulse oximeter) at home?" If Yes, ask: "What is your reading (oxygen level) today?" "What is your usual oxygen saturation reading?" (e.g., 95%)        11. PREGNANCY: "Is there any chance you are pregnant?" "When was your last menstrual period?"        12. TRAVEL: "Have you traveled out of the country in the last month?" (e.g., travel history, exposures)  Protocols used: Breathing Difficulty-A-AH, Rash or Redness - Localized-A-AH, Sinus Pain or Congestion-A-AH

## 2023-02-20 ENCOUNTER — Telehealth: Payer: Self-pay | Admitting: Internal Medicine

## 2023-02-20 NOTE — Telephone Encounter (Signed)
Copied from [place] [id number]. Topic: General - Other >> Feb 20, 2023 10:54 AM [place] B wrote: Reason for CRM: Patient called in asked to speak with Dr Rosana Berger about her asthma and work situation

## 2023-02-20 NOTE — Telephone Encounter (Signed)
Pt will need FLMA paperwork for chronic asthma.  She missed work for asthma for wed,Friday and sunday

## 2023-02-22 ENCOUNTER — Ambulatory Visit: Payer: Self-pay | Admitting: *Deleted

## 2023-02-22 NOTE — Telephone Encounter (Signed)
  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 Taking prednisone at this time. Reports she would like to be tested for mold.  Frequency: Wednesday 02/15/23 Pertinent Negatives: Patient denies chest pain no difficulty breathing  Disposition: []$ ED /[]$ Urgent Care (no appt availability in office) / [x]$ Appointment(In office/virtual)/ []$  [facility]/ []$ Home Care/ []$ Refused Recommended Disposition /[]$  Mobile Bus/ []$  Follow-up with PCP Additional Notes:   Appt already scheduled for tomorrow. Recommended if sx worsen go to UC /ED. Recommended humidifier and to use cool compress for arms and ankle itching and hydrocortisone cream if needed. Patient reports "bumps" drying up some but not gone. Requesting paperwork for FMLA to be completed. Concerned regarding future surgery for hip 04/14/23 may be set back if breathing issues are not better.     Reason for Disposition  [1] MILD difficulty breathing (e.g., minimal/no SOB at rest, SOB with walking, pulse <100) AND [2] NEW-onset or WORSE than normal  Answer Assessment - Initial Assessment Questions 1. RESPIRATORY STATUS: "Describe your breathing?" (e.g., wheezing, shortness of breath, unable to speak, severe coughing)      Shortness of breath 2. ONSET: "When did this breathing problem begin?"      Last Wednesday 02/15/23 3. PATTERN "Does the difficult breathing come and go, or has it been constant since it started?"      Comes and goes but concerned getting worse 4. SEVERITY: "How bad is your breathing?" (e.g., mild, moderate, severe)    - MILD: No SOB at rest, mild SOB with walking, speaks normally in sentences, can lie down, no retractions, pulse < 100.    - MODERATE: SOB at rest, SOB with minimal exertion and prefers to sit, cannot lie down flat, speaks in phrases, mild retractions, audible wheezing, pulse 100-120.    -  SEVERE: Very SOB at rest, speaks in single words, struggling to breathe, sitting hunched forward, retractions, pulse > 120      na 5. RECURRENT SYMPTOM: "Have you had difficulty breathing before?" If Yes, ask: "When was the last time?" and "What happened that time?"      Yes  6. CARDIAC HISTORY: "Do you have any history of heart disease?" (e.g., heart attack, angina, bypass surgery, angioplasty)      na 7. LUNG HISTORY: "Do you have any history of lung disease?"  (e.g., pulmonary embolus, asthma, emphysema)     See hx  8. CAUSE: "What do you think is causing the breathing problem?"      Construction work at job  9. OTHER SYMPTOMS: "Do you have any other symptoms? (e.g., dizziness, runny nose, cough, chest pain, fever)     Shortness of breath at times. Using emergency inhaler, bumps on arms and ankles still itching  10. O2 SATURATION MONITOR:  "Do you use an oxygen saturation monitor (pulse oximeter) at home?" If Yes, ask: "What is your reading (oxygen level) today?" "What is your usual oxygen saturation reading?" (e.g., 95%)       na 11. PREGNANCY: "Is there any chance you are pregnant?" "When was your last menstrual period?"       na 12. TRAVEL: "Have you traveled out of the country in the last month?" (e.g., travel history, exposures)       na  Protocols used: Breathing Difficulty-A-AH

## 2023-02-22 NOTE — Telephone Encounter (Signed)
Patient called in says doesnt have FMLA paperwork but has continious leave paperwork, and also wants to know when she can go back to work

## 2023-02-22 NOTE — Telephone Encounter (Signed)
Patient would like to speak with PCP to confirm if FMLA paperwork was received from [employer]. Patient requesting a Dr. Note excusing her from work.Patient is scheduled with PCP on 02/23/2023 to discuss FMLA paperwork and asthma concerns.   Please document if forms were received.    Patient call back # [phone]

## 2023-02-23 ENCOUNTER — Ambulatory Visit (INDEPENDENT_AMBULATORY_CARE_PROVIDER_SITE_OTHER): Payer: 59 | Admitting: Internal Medicine

## 2023-02-23 ENCOUNTER — Other Ambulatory Visit: Payer: Self-pay

## 2023-02-23 ENCOUNTER — Telehealth: Payer: Self-pay | Admitting: Internal Medicine

## 2023-02-23 ENCOUNTER — Encounter: Payer: Self-pay | Admitting: Internal Medicine

## 2023-02-23 VITALS — BP 130/80 | HR 99 | Temp 98.8°F | Resp 20 | Ht 67.0 in | Wt 249.6 lb

## 2023-02-23 DIAGNOSIS — F419 Anxiety disorder, unspecified: Secondary | ICD-10-CM

## 2023-02-23 DIAGNOSIS — F319 Bipolar disorder, unspecified: Secondary | ICD-10-CM

## 2023-02-23 DIAGNOSIS — J45909 Unspecified asthma, uncomplicated: Secondary | ICD-10-CM

## 2023-02-23 MED ORDER — OLANZAPINE 10 MG PO TABS: 10.0000 mg | ORAL_TABLET | Freq: Every day | ORAL | 1 refills | Status: DC

## 2023-02-23 MED ORDER — OLANZAPINE 10 MG PO TABS
10.0000 mg | ORAL_TABLET | Freq: Every day | ORAL | 1 refills | Status: DC
  Filled 2023-02-23: qty 30, 30d supply, fill #0

## 2023-02-23 NOTE — Telephone Encounter (Signed)
Resent to correct pharmacy.

## 2023-02-23 NOTE — Telephone Encounter (Signed)
Copied from [place] [id number]. Topic: General - Other >> Feb 23, 2023 12:35 PM Dominique A wrote: Reason for CRM: Pt states that she was seen in office this morning with  her PCP and her medication was suppose to be called in. 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  Please advise. OLANZapine (ZYPREXA) 10 MG tablet [facility] [address]), [place] - [address]  Phone: [phone] Fax: [phone]

## 2023-02-23 NOTE — Progress Notes (Signed)
Established Patient Office Visit  Subjective    Patient ID: Diana Galvan, female    DOB: [birth year]  Age: 57 y.o. MRN: [id number]  CC:  Chief Complaint  Patient presents with   Shortness of Breath    Asthma flare with wheezing and coughing    HPI Diana Galvan presents to follow up on asthma and for [facility] paperwork.   Asthma/Allergies:  -Asthma status: uncontrolled -Current Treatments: Symbicort, Albuterol PRN, Duoneb treatments PRN, Singulair 10 mg, Flonase and Zyrtec.   -Satisfied with current treatment?: no -Dyspnea frequency: yes -Wheezing frequency: yes -Was seen for asthma exacerbation here in the office on 2/23 - was treated with Prednisone  -Visits to ER or Urgent Care in past year: no -Pneumovax: Up to Date -Influenza: Up to Date  Bipolar: -Uncertain if type 1 or 2, sounds like she can be both manic and depressed but uncertain which is more prevalent.  -Not currently on treatment, had been on multiple medications previously   Outpatient Encounter Medications as of 02/23/2023  Medication Sig   albuterol (VENTOLIN HFA) 108 (90 Base) MCG/ACT inhaler Inhale 2 puffs into the lungs every 6 (six) hours as needed for wheezing or shortness of breath.   budesonide-formoterol (SYMBICORT) 160-4.5 MCG/ACT inhaler Inhale 2 puffs into the lungs 2 (two) times daily.   cetirizine (ZYRTEC) 10 MG tablet Take 1 tablet (10 mg total) by mouth daily.   fluticasone (FLONASE) 50 MCG/ACT nasal spray Place 1 spray into both nostrils daily.   ipratropium-albuterol (DUONEB) 0.5-2.5 (3) MG/3ML SOLN Take 3 mLs by nebulization every 6 (six) hours as needed.   montelukast (SINGULAIR) 10 MG tablet Take 1 tablet (10 mg total) by mouth at bedtime.   No facility-administered encounter medications on file as of 02/23/2023.    Past Medical History:  Diagnosis Date   Asthma    Coronary artery disease    mild cad in mLAD on coronary CTA   Costochondritis    DDD (degenerative disc  disease), lumbar    Depression    History of Erb's palsy    Thyroid disease    Thyroid goiter     Past Surgical History:  Procedure Laterality Date   APPENDECTOMY     COLONOSCOPY WITH PROPOFOL N/A 02/03/2020   Procedure: COLONOSCOPY WITH PROPOFOL;  Surgeon: Lucilla Lame, MD;  Location: [facility] ENDOSCOPY;  Service: Endoscopy;  Laterality: N/A;  Priority 4   THYROIDECTOMY     TUBAL LIGATION     VAGINAL HYSTERECTOMY  10/30/2002    Family History  Problem Relation Age of Onset   Hypertension Mother    Hyperlipidemia Mother    Mental illness Mother    Heart Problems Mother    Asthma Father    Heart disease Father    Congestive Heart Failure Paternal Aunt    Heart failure Paternal Aunt    Heart failure Paternal Grandmother    Breast cancer Cousin     Social History   Socioeconomic History   Marital status: Divorced    Spouse name: Not on file   Number of children: 0   Years of education: Not on file   Highest education level: Not on file  Occupational History   Not on file  Tobacco Use   Smoking status: Some Days    Packs/day: 0.25    Years: 38.00    Total pack years: 9.50    Types: Cigarettes   Smokeless tobacco: Never  Vaping Use   Vaping Use: Former   Substances:  Nicotine  Substance and Sexual Activity   Alcohol use: Not Currently   Drug use: Not Currently    Types: "Crack" cocaine    Comment: Recovered 2001   Sexual activity: Yes    Partners: Male    Birth control/protection: None  Other Topics Concern   Not on file  Social History Narrative   Not on file   Social Determinants of Health   Financial Resource Strain: Not on file  Food Insecurity: Not on file  Transportation Needs: Not on file  Physical Activity: Not on file  Stress: Not on file  Social Connections: Not on file  Intimate Partner Violence: Not on file    Review of Systems  Constitutional:  Negative for chills and fever.  Respiratory:  Positive for cough, sputum production and  shortness of breath.   Psychiatric/Behavioral:  The patient is nervous/anxious.        Objective    BP 130/80   Pulse 99   Temp 98.8 F (37.1 C)   Resp 20   Ht '5\' 7"'$  (1.702 m)   Wt 249 lb 9.6 oz (113.2 kg)   SpO2 98%   BMI 39.09 kg/m   Physical Exam Constitutional:      Appearance: Normal appearance.  HENT:     Head: Normocephalic and atraumatic.  Eyes:     Conjunctiva/sclera: Conjunctivae normal.  Cardiovascular:     Rate and Rhythm: Normal rate and regular rhythm.  Pulmonary:     Effort: Pulmonary effort is normal.     Breath sounds: Normal breath sounds.  Skin:    General: Skin is warm and dry.  Neurological:     General: No focal deficit present.     Mental Status: She is alert. Mental status is at baseline.  Psychiatric:        Attention and Perception: Attention normal.        Mood and Affect: Mood is anxious. Affect is tearful.        Speech: Speech is rapid and pressured and tangential.        Behavior: Behavior normal.       Assessment & Plan:   1. Asthma, unspecified asthma severity, unspecified whether complicated, unspecified whether persistent: FMLA paperwork filled out, work note given for patient to return to work next Tuesday. Still on steroids, inhalers and allergy medications.   2. Bipolar 1 disorder, depressed (HCC)/Anxiety: I think a majority of her shortness of breath and respiratory symptoms are due to untreated Bipolar disorder. Patient anxious, tangential. Will start Zyprexa 10 mg today and recheck in 6 weeks. Plan to increase or potentially add Prozac depending on how she is doing. Consider starting therapy as well, maybe social work referral. Patient has a history of trauma.   - OLANZapine (ZYPREXA) 10 MG tablet; Take 1 tablet (10 mg total) by mouth at bedtime.  Dispense: 30 tablet; Refill: 1   Return in about 6 weeks (around 04/06/2023).   Teodora Medici, DO

## 2023-02-26 NOTE — Progress Notes (Deleted)
Established Patient Office Visit  Subjective    Patient ID: Diana Galvan, female    DOB: 03-25-1966  Age: 57 y.o. MRN: [id number]  CC:  No chief complaint on file.   HPI Diana Galvan presents to follow up on asthma and for [facility] paperwork.   Asthma/Allergies:  -Asthma status: uncontrolled -Current Treatments: Symbicort, Albuterol PRN, Duoneb treatments PRN, Singulair 10 mg, Flonase and Zyrtec.   -Satisfied with current treatment?: no -Dyspnea frequency: yes -Wheezing frequency: yes -Was seen for asthma exacerbation here in the office on 2/23 - was treated with Prednisone  -Visits to ER or Urgent Care in past year: no -Pneumovax: Up to Date -Influenza: Up to Date  Bipolar: -Uncertain if type 1 or 2, sounds like she can be both manic and depressed but uncertain which is more prevalent.  -Not currently on treatment, had been on multiple medications previously   Outpatient Encounter Medications as of 02/27/2023  Medication Sig   albuterol (VENTOLIN HFA) 108 (90 Base) MCG/ACT inhaler Inhale 2 puffs into the lungs every 6 (six) hours as needed for wheezing or shortness of breath.   budesonide-formoterol (SYMBICORT) 160-4.5 MCG/ACT inhaler Inhale 2 puffs into the lungs 2 (two) times daily.   cetirizine (ZYRTEC) 10 MG tablet Take 1 tablet (10 mg total) by mouth daily.   fluticasone (FLONASE) 50 MCG/ACT nasal spray Place 1 spray into both nostrils daily.   ipratropium-albuterol (DUONEB) 0.5-2.5 (3) MG/3ML SOLN Take 3 mLs by nebulization every 6 (six) hours as needed.   montelukast (SINGULAIR) 10 MG tablet Take 1 tablet (10 mg total) by mouth at bedtime.   OLANZapine (ZYPREXA) 10 MG tablet Take 1 tablet (10 mg total) by mouth at bedtime.   No facility-administered encounter medications on file as of 02/27/2023.    Past Medical History:  Diagnosis Date   Asthma    Coronary artery disease    mild cad in mLAD on coronary CTA   Costochondritis    DDD (degenerative  disc disease), lumbar    Depression    History of Erb's palsy    Thyroid disease    Thyroid goiter     Past Surgical History:  Procedure Laterality Date   APPENDECTOMY     COLONOSCOPY WITH PROPOFOL N/A 02/03/2020   Procedure: COLONOSCOPY WITH PROPOFOL;  Surgeon: Lucilla Lame, MD;  Location: [facility] ENDOSCOPY;  Service: Endoscopy;  Laterality: N/A;  Priority 4   THYROIDECTOMY     TUBAL LIGATION     VAGINAL HYSTERECTOMY  10/30/2002    Family History  Problem Relation Age of Onset   Hypertension Mother    Hyperlipidemia Mother    Mental illness Mother    Heart Problems Mother    Asthma Father    Heart disease Father    Congestive Heart Failure Paternal Aunt    Heart failure Paternal Aunt    Heart failure Paternal Grandmother    Breast cancer Cousin     Social History   Socioeconomic History   Marital status: Divorced    Spouse name: Not on file   Number of children: 0   Years of education: Not on file   Highest education level: Not on file  Occupational History   Not on file  Tobacco Use   Smoking status: Some Days    Packs/day: 0.25    Years: 38.00    Total pack years: 9.50    Types: Cigarettes   Smokeless tobacco: Never  Vaping Use   Vaping Use: Former  Substances: Nicotine  Substance and Sexual Activity   Alcohol use: Not Currently   Drug use: Not Currently    Types: "Crack" cocaine    Comment: Recovered 2001   Sexual activity: Yes    Partners: Male    Birth control/protection: None  Other Topics Concern   Not on file  Social History Narrative   Not on file   Social Determinants of Health   Financial Resource Strain: Not on file  Food Insecurity: Not on file  Transportation Needs: Not on file  Physical Activity: Not on file  Stress: Not on file  Social Connections: Not on file  Intimate Partner Violence: Not on file    Review of Systems  Constitutional:  Negative for chills and fever.  Respiratory:  Positive for cough, sputum production and  shortness of breath.   Psychiatric/Behavioral:  The patient is nervous/anxious.        Objective    There were no vitals taken for this visit.  Physical Exam Constitutional:      Appearance: Normal appearance.  HENT:     Head: Normocephalic and atraumatic.  Eyes:     Conjunctiva/sclera: Conjunctivae normal.  Cardiovascular:     Rate and Rhythm: Normal rate and regular rhythm.  Pulmonary:     Effort: Pulmonary effort is normal.     Breath sounds: Normal breath sounds.  Skin:    General: Skin is warm and dry.  Neurological:     General: No focal deficit present.     Mental Status: She is alert. Mental status is at baseline.  Psychiatric:        Attention and Perception: Attention normal.        Mood and Affect: Mood is anxious. Affect is tearful.        Speech: Speech is rapid and pressured and tangential.        Behavior: Behavior normal.       Assessment & Plan:   1. Asthma, unspecified asthma severity, unspecified whether complicated, unspecified whether persistent: FMLA paperwork filled out, work note given for patient to return to work next Tuesday. Still on steroids, inhalers and allergy medications.   2. Bipolar 1 disorder, depressed (HCC)/Anxiety: I think a majority of her shortness of breath and respiratory symptoms are due to untreated Bipolar disorder. Patient anxious, tangential. Will start Zyprexa 10 mg today and recheck in 6 weeks. Plan to increase or potentially add Prozac depending on how she is doing. Consider starting therapy as well, maybe social work referral. Patient has a history of trauma.   - OLANZapine (ZYPREXA) 10 MG tablet; Take 1 tablet (10 mg total) by mouth at bedtime.  Dispense: 30 tablet; Refill: 1   No follow-ups on file.   Teodora Medici, DO

## 2023-02-27 ENCOUNTER — Ambulatory Visit: Payer: 59 | Admitting: Internal Medicine

## 2023-03-01 ENCOUNTER — Encounter (INDEPENDENT_AMBULATORY_CARE_PROVIDER_SITE_OTHER): Payer: Self-pay

## 2023-04-27 ENCOUNTER — Encounter: Payer: 59 | Admitting: Orthopaedic Surgery

## 2023-05-23 ENCOUNTER — Telehealth: Payer: Self-pay | Admitting: Orthopaedic Surgery

## 2023-05-23 NOTE — Telephone Encounter (Signed)
LMOM to schedule patient 6/13 -- OK to [facility] over new patient ortho slot

## 2023-05-23 NOTE — Telephone Encounter (Signed)
Patient scheduled with Dr. Shon Baton for hip injections as patient's schedule did not align with Dr. Serena Croissant schedule

## 2023-05-23 NOTE — Telephone Encounter (Signed)
Patient called. Would like to be worked in on 6/13. She is in pain. Started a new job and 6/13 is the only day she can come in. Her call back number is [phone]

## 2023-06-05 ENCOUNTER — Ambulatory Visit: Payer: 59 | Admitting: Sports Medicine

## 2023-06-06 ENCOUNTER — Encounter: Payer: Self-pay | Admitting: Emergency Medicine

## 2023-06-06 ENCOUNTER — Other Ambulatory Visit: Payer: Self-pay

## 2023-06-06 ENCOUNTER — Emergency Department
Admission: EM | Admit: 2023-06-06 | Discharge: 2023-06-06 | Disposition: A | Discharge status: Home / Self Care | Payer: 59 | Attending: Emergency Medicine | Admitting: Emergency Medicine

## 2023-06-06 ENCOUNTER — Emergency Department: Payer: 59

## 2023-06-06 DIAGNOSIS — J45901 Unspecified asthma with (acute) exacerbation: Secondary | ICD-10-CM | POA: Diagnosis not present

## 2023-06-06 DIAGNOSIS — H1011 Acute atopic conjunctivitis, right eye: Secondary | ICD-10-CM | POA: Insufficient documentation

## 2023-06-06 DIAGNOSIS — H5789 Other specified disorders of eye and adnexa: Secondary | ICD-10-CM | POA: Diagnosis present

## 2023-06-06 LAB — BASIC METABOLIC PANEL
Anion gap: 11 (ref 5–15)
BUN: 15 mg/dL (ref 6–20)
CO2: 23 mmol/L (ref 22–32)
Calcium: 9.2 mg/dL (ref 8.9–10.3)
Chloride: 101 mmol/L (ref 98–111)
Creatinine, Ser: 0.68 mg/dL (ref 0.44–1.00)
GFR, Estimated: >60 mL/min (ref >60)
Glucose, Bld: 95 mg/dL (ref 70–99)
Potassium: 3.4 mmol/L — ABNORMAL LOW (ref 3.5–5.1)
Sodium: 135 mmol/L (ref 135–145)

## 2023-06-06 LAB — CBC
HCT: 35.5 % — ABNORMAL LOW (ref 36.0–46.0)
Hemoglobin: 11.5 g/dL — ABNORMAL LOW (ref 12.0–15.0)
MCH: 28.7 pg (ref 26.0–34.0)
MCHC: 32.4 g/dL (ref 30.0–36.0)
MCV: 88.5 fL (ref 80.0–100.0)
Platelets: 358 10*3/uL (ref 150–400)
RBC: 4.01 MIL/uL (ref 3.87–5.11)
RDW: 12.8 % (ref 11.5–15.5)
WBC: 7.5 10*3/uL (ref 4.0–10.5)
nRBC: 0 % (ref 0.0–0.2)

## 2023-06-06 MED ORDER — OLOPATADINE HCL 0.1 % OP SOLN: 1.0000 [drp] | Freq: Two times a day (BID) | OPHTHALMIC | 0 refills | Status: AC

## 2023-06-06 MED ORDER — IPRATROPIUM-ALBUTEROL 0.5-2.5 (3) MG/3ML IN SOLN
3.0000 mL | Freq: Once | RESPIRATORY_TRACT | Status: AC
  Administered 2023-06-06: 3 mL via RESPIRATORY_TRACT
  Filled 2023-06-06: qty 3

## 2023-06-06 MED ORDER — PREDNISONE 50 MG PO TABS
50.0000 mg | ORAL_TABLET | Freq: Every day | ORAL | 0 refills | Status: DC
  Filled 2023-06-06: qty 4, 4d supply, fill #0

## 2023-06-06 MED ORDER — OLOPATADINE HCL 0.1 % OP SOLN
1.0000 [drp] | Freq: Two times a day (BID) | OPHTHALMIC | 0 refills | Status: DC
  Filled 2023-06-06: qty 5, 30d supply, fill #0

## 2023-06-06 MED ORDER — PREDNISONE 50 MG PO TABS: 50.0000 mg | ORAL_TABLET | Freq: Every day | ORAL | 0 refills | Status: DC

## 2023-06-06 MED ORDER — PREDNISONE 20 MG PO TABS
60.0000 mg | ORAL_TABLET | Freq: Once | ORAL | Status: AC
  Administered 2023-06-06: 60 mg via ORAL
  Filled 2023-06-06: qty 3

## 2023-06-06 NOTE — ED Triage Notes (Signed)
Pt sts that she has been having eye drainage out of the left eye for a couple of days. Pt sts that it feels like a gnat is flying around in her eye and than it gets red and has discharge. Pt also sts that she has been feeling a little SOB due to her asthma. Pt sts that she has used her meds for her asthma but it did not work.

## 2023-06-06 NOTE — ED Provider Notes (Signed)
[facility] Provider Note    Event Date/Time   First MD Initiated Contact with Patient 06/06/23 1457     (approximate)   History   Eye Drainage and Asthma   HPI  Diana Galvan is a 57 y.o. female who complains of foreign body sensation in the right eye and intermittent drainage and swelling of the right eye over the last week.  Currently it is improved and asymptomatic.  She is concerned that she has a bug in her eye.  And additionally she reports her asthma is flaring up.     Physical Exam   Triage Vital Signs: ED Triage Vitals  Enc Vitals Group     BP 06/06/23 1418 121/88     Pulse Rate 06/06/23 1418 93     Resp 06/06/23 1418 20     Temp 06/06/23 1418 98 F (36.7 C)     Temp Source 06/06/23 1418 Oral     SpO2 06/06/23 1418 98 %     Weight 06/06/23 1419 108.9 kg (240 lb)     Height --      Head Circumference --      Peak Flow --      Pain Score 06/06/23 1419 0     Pain Loc --      Pain Edu? --      Excl. in GC? --     Most recent vital signs: Vitals:   06/06/23 1418  BP: 121/88  Pulse: 93  Resp: 20  Temp: 98 F (36.7 C)  SpO2: 98%     General: Awake, no distress.  CV:  Good peripheral perfusion.  Resp:  Normal effort.  Mild scattered wheezing Abd:  No distention.  Other:  Right eye exam: Mild cobblestoning of the conjunctiva, otherwise normal exam, no foreign bodies, no corneal abrasion, no erythema   ED Results / Procedures / Treatments   Labs (all labs ordered are listed, but only abnormal results are displayed) Labs Reviewed  CBC - Abnormal; Notable for the following components:      Result Value   Hemoglobin 11.5 (*)    HCT 35.5 (*)    All other components within normal limits  BASIC METABOLIC PANEL - Abnormal; Notable for the following components:   Potassium 3.4 (*)    All other components within normal limits     EKG  ED ECG REPORT I, Jene Every, the attending physician, personally viewed and  interpreted this ECG.  Date: 06/06/2023  Rhythm: normal sinus rhythm QRS Axis: normal Intervals: normal ST/T Wave abnormalities: normal Narrative Interpretation: no evidence of acute ischemia    RADIOLOGY  Chest x-ray viewed interpret by me, no acute abnormality   PROCEDURES:  Critical Care performed:   Procedures   MEDICATIONS ORDERED IN ED: Medications  ipratropium-albuterol (DUONEB) 0.5-2.5 (3) MG/3ML nebulizer solution 3 mL (3 mLs Nebulization Given 06/06/23 1542)  predniSONE (DELTASONE) tablet 60 mg (60 mg Oral Given 06/06/23 1542)     IMPRESSION / MDM / ASSESSMENT AND PLAN / ED COURSE  I reviewed the triage vital signs and the nursing notes. Patient's presentation is most consistent with exacerbation of chronic illness.  Patient presents with an exacerbation with some mild wheezing as described above, will treat with DuoNeb, prednisone.  Prednisone will probably also help with her allergic conjunctivitis which this seems to be.  No evidence of foreign body.  Will start on Zyrtec, Pataday, appropriate for outpatient treatment, no indication for admission.  FINAL CLINICAL IMPRESSION(S) / ED DIAGNOSES   Final diagnoses:  Allergic conjunctivitis of right eye  Mild asthma with exacerbation, unspecified whether persistent     Rx / DC Orders   ED Discharge Orders          Ordered    predniSONE (DELTASONE) 50 MG tablet  Daily with breakfast,   Status:  Discontinued        06/06/23 1528    olopatadine (PATADAY) 0.1 % ophthalmic solution  2 times daily,   Status:  Discontinued       Note to Pharmacy: stock size 5 mL   06/06/23 1528    olopatadine (PATADAY) 0.1 % ophthalmic solution  2 times daily       Note to Pharmacy: stock size 5 mL   06/06/23 1600    predniSONE (DELTASONE) 50 MG tablet  Daily with breakfast        06/06/23 1600             Note:  This document was prepared using Dragon voice recognition software and may include  unintentional dictation errors.   Jene Every, MD 06/06/23 1700

## 2023-06-07 ENCOUNTER — Other Ambulatory Visit: Payer: Self-pay

## 2023-06-19 ENCOUNTER — Encounter: Payer: Self-pay | Admitting: Physician Assistant

## 2023-06-19 ENCOUNTER — Other Ambulatory Visit: Payer: Self-pay

## 2023-06-19 ENCOUNTER — Ambulatory Visit (INDEPENDENT_AMBULATORY_CARE_PROVIDER_SITE_OTHER): Payer: 59 | Admitting: Physician Assistant

## 2023-06-19 ENCOUNTER — Other Ambulatory Visit: Payer: Self-pay | Admitting: Internal Medicine

## 2023-06-19 VITALS — BP 136/72 | HR 108 | Temp 98.1°F | Resp 16 | Ht 67.0 in | Wt 251.9 lb

## 2023-06-19 DIAGNOSIS — H1033 Unspecified acute conjunctivitis, bilateral: Secondary | ICD-10-CM | POA: Diagnosis not present

## 2023-06-19 DIAGNOSIS — Z8709 Personal history of other diseases of the respiratory system: Secondary | ICD-10-CM

## 2023-06-19 DIAGNOSIS — M7989 Other specified soft tissue disorders: Secondary | ICD-10-CM | POA: Diagnosis not present

## 2023-06-19 MED ORDER — ERYTHROMYCIN 5 MG/GM OP OINT
1.0000 | TOPICAL_OINTMENT | Freq: Four times a day (QID) | OPHTHALMIC | 0 refills | Status: DC
Start: 2023-06-19 — End: 2023-06-19
  Filled 2023-06-19: qty 28, 7d supply, fill #0

## 2023-06-19 MED ORDER — ERYTHROMYCIN 5 MG/GM OP OINT
1.0000 | TOPICAL_OINTMENT | Freq: Four times a day (QID) | OPHTHALMIC | 0 refills | Status: AC
Start: 2023-06-19 — End: 2023-06-26

## 2023-06-19 NOTE — Telephone Encounter (Signed)
Resending d/t sent in to wrong pharmacy.  Requested Prescriptions  Pending Prescriptions Disp Refills   erythromycin ophthalmic ointment 28 g 0    Sig: Place 1 Application into both eyes 4 (four) times daily for 7 days. Apply a half inch strip to the lower inner eye four times per day for 7 days.     Off-Protocol Failed - 06/19/2023  5:35 PM      Failed - Medication not assigned to a protocol, review manually.      Passed - Valid encounter within last 12 months    Recent Outpatient Visits           Today History of asthma   [place] Mecum, Erin E, PA-C   3 months ago Asthma, unspecified asthma severity, unspecified whether complicated, unspecified whether persistent   [facility] Margarita Mail, DO   4 months ago Asthma with acute exacerbation, unspecified asthma severity, unspecified whether persistent   [facility] Danelle Berry, PA-C   5 months ago Asthma, unspecified asthma severity, unspecified whether complicated, unspecified whether persistent   [facility] Margarita Mail, DO       Future Appointments             In 4 weeks Margarita Mail, DO [place]

## 2023-06-19 NOTE — Telephone Encounter (Signed)
Medication Refill - Medication: Rx #: [id number]  erythromycin ophthalmic ointment [[id number]]   Medication has been sent to the wrong pharmacy. Please advise   Has the patient contacted their pharmacy? Yes.   (Agent: If no, request that the patient contact the pharmacy for the refill. If patient does not wish to contact the pharmacy document the reason why and proceed with request.) (Agent: If yes, when and what did the pharmacy advise?)  Preferred Pharmacy (with phone number or street name): [facility] [address] [place]), [place] - [address] Phone: [phone]  Fax: [phone]   Has the patient been seen for an appointment in the last year OR does the patient have an upcoming appointment? Yes.    Agent: Please be advised that RX refills may take up to 3 business days. We ask that you follow-up with your pharmacy.

## 2023-06-19 NOTE — Patient Instructions (Signed)
Based on your symptoms I believe that you have bacterial conjunctivitis  I have sent in a script for Erythromycin ophthalmic ointment - please apply a 1/2 inch strip of the ointment to your right eye every 6 hours for 7 days  You can use sterile eye flushes and lubricating eye drops to assist with eye irritation and further resolution You can also use a warm compress over the eye to assist with swelling and matting - especially in the morning  If you have used makeup or mascara on that eye I recommend discarding it as this can cause recurrent infection. Thoroughly wash any makeup brushes and avoid using makeup while recovering from the infection.  If you notice the following please return to the office: lack of improvement, eyelid swelling, increased eye irritation If you notice the following please go to the ED: eye pressure causing displacement of the eye, vision changes, increased eye pain or foreign body sensation, fever  I recommend using compression stocking with 15- 25 mmHg compression force to help with your leg swelling Put these on first thing in the morning and wear until you are ready to shower/ go to bed  Please measure the circumference of your calves at the widest part and use your shoe size to get the appropriate size stockings   Please start using your Symbicort every day as directed You should only be using your albuterol inhaler and nebulizer as needed when you have trouble breathing. If you are having to use it more than 3 times per day every day- you will need to be seen in the office with your PCP for further evaluation

## 2023-06-19 NOTE — Progress Notes (Unsigned)
Acute Office Visit   Patient: Diana Galvan   DOB: 31-May-1966   57 y.o. Female  MRN: [id number] Visit Date: 06/19/2023  Today's healthcare provider: Oswaldo Conroy Levy Cedano, PA-C  Introduced myself to the patient as a Secondary school teacher and provided education on APPs in clinical practice.    Chief Complaint  Patient presents with   Conjunctivitis    Right eyew/ SOB went to ER on 6/11 and has not started meds given   Subjective    HPI HPI     Conjunctivitis    Additional comments: Right eyew/ SOB went to ER on 6/11 and has not started meds given      Last edited by Marcos Eke, CMA on 06/19/2023  4:14 PM.       Concern for Pink eye   She reports this initially started with her right eye and now both eyes are swollen and draining  She has been using an eye wash and eye drops she was given by her eye doctor   She reports she is also having breathing issues She states she has been using her breathing machine but thinks it is too old and is not giving her enough medication She is not using her Symbicort because she was given albuterol  She has been using her albuterol inhaler often over the past two weeks  She was doing breathing treatments every 3 hours on Friday due to trouble breathing   She reports intermittently productive coughing  She reports when she sits she is noticing ankle swelling  She states she sits a lot throughout the day - sits at work and at home to finish her work     Medications: Outpatient Medications Prior to Visit  Medication Sig   albuterol (VENTOLIN HFA) 108 (90 Base) MCG/ACT inhaler Inhale 2 puffs into the lungs every 6 (six) hours as needed for wheezing or shortness of breath.   budesonide-formoterol (SYMBICORT) 160-4.5 MCG/ACT inhaler Inhale 2 puffs into the lungs 2 (two) times daily.   cetirizine (ZYRTEC) 10 MG tablet Take 1 tablet (10 mg total) by mouth daily.   fluticasone (FLONASE) 50 MCG/ACT nasal spray Place 1 spray into both nostrils  daily.   ipratropium-albuterol (DUONEB) 0.5-2.5 (3) MG/3ML SOLN Take 3 mLs by nebulization every 6 (six) hours as needed.   montelukast (SINGULAIR) 10 MG tablet Take 1 tablet (10 mg total) by mouth at bedtime.   OLANZapine (ZYPREXA) 10 MG tablet Take 1 tablet (10 mg total) by mouth at bedtime.   predniSONE (DELTASONE) 50 MG tablet Take 1 tablet (50 mg total) by mouth daily with breakfast.   No facility-administered medications prior to visit.    Review of Systems  Constitutional:  Negative for chills and fever.  Eyes:  Positive for discharge, redness and visual disturbance (blurry vision).  Respiratory:  Positive for cough, shortness of breath and wheezing.   Cardiovascular:  Positive for leg swelling.       Objective    BP 136/72   Pulse (!) 108   Temp 98.1 F (36.7 C)   Resp 16   Ht 5\' 7"  (1.702 m)   Wt 251 lb 14.4 oz (114.3 kg)   SpO2 98%   BMI 39.45 kg/m    Physical Exam Vitals reviewed.  Constitutional:      General: She is awake.     Appearance: Normal appearance. She is well-developed and well-groomed.  HENT:     Head: Normocephalic and atraumatic.  Eyes:     General: Gaze aligned appropriately.        Right eye: Discharge present. No foreign body or hordeolum.        Left eye: No foreign body or hordeolum.     Extraocular Movements: Extraocular movements intact.     Conjunctiva/sclera:     Right eye: Right conjunctiva is injected. Exudate present.     Pupils: Pupils are equal, round, and reactive to light.     Comments: Mild swelling noted of upper and lower right eyelids   Cardiovascular:     Rate and Rhythm: Normal rate and regular rhythm.     Pulses: Normal pulses.          Radial pulses are 2+ on the right side and 2+ on the left side.     Heart sounds: Normal heart sounds. No murmur heard.    No friction rub. No gallop.  Pulmonary:     Effort: Pulmonary effort is normal.     Breath sounds: Normal breath sounds. No decreased air movement. No  decreased breath sounds, wheezing, rhonchi or rales.  Musculoskeletal:     Cervical back: Normal range of motion and neck supple.     Right lower leg: Edema present.     Left lower leg: Edema present.  Skin:    General: Skin is warm and dry.  Neurological:     General: No focal deficit present.     Mental Status: She is alert and oriented to person, place, and time.  Psychiatric:        Behavior: Behavior is cooperative.       No results found for any visits on 06/19/23.  Assessment & Plan      No follow-ups on file.       Problem List Items Addressed This Visit       Other   History of asthma    Chronic, historic condition Patient reports that she is having to use her albuterol inhaler and nebulizer every 3 to 4 hours/day for relief from SOBOE and dyspnea We discussed her current regimen and she states that she has not been using her Symbicort and has been relying instead on her albuterol nebulizer We discussed that her Symbicort is her maintenance medication and that she needs to take this as directed every day to help prevent asthma symptoms and that her albuterol inhaler and nebulizer are for acute exacerbations. I have sent in a DME order for a new nebulizer per her request I recommend follow-up with her PCP as needed for persistent or progressing symptoms      Relevant Orders   For home use only DME Nebulizer machine   Other Visit Diagnoses     Acute conjunctivitis of both eyes, unspecified acute conjunctivitis type    -  Primary Acute, new concern Patient presents with periorbital swelling of the right eye along with clear, watery drainage and redness. She also has some mild watery drainage in the left eye. She is previously seen for this in the emergency room and was diagnosed with allergic conjunctivitis but she did not start the recommended eyedrops. We reviewed allergic, bacterial, viral conjunctivitis-will send in erythromycin ophthalmic ointment for  bacterial conjunctivitis coverage.  Also recommend lubricating eyedrops as well as warm compresses to further assist with symptom management.  After visit summary contains further recommendations for management and treatment as well as emergency room precautions    Swelling of lower extremity     Unsure of chronicity,  new concern Patient reports that she has bilateral lower extremity swelling that is particularly worse at the end of the day.  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 Physical exam is notable for mild bilateral swelling but no apparent pitting edema We reviewed using compression stockings to assist with dependent edema Recommendations for appropriate sizing as well as compression force*provided in AVS.  I recommend that she find some that are between 15-25 mmHg of compression force to provide daily swelling management If edema and swelling persists recommend that she follows up with PCP to discuss potential evaluation and further management        No follow-ups on file.   I, Maleni Seyer E Andra Heslin, PA-C, have reviewed all documentation for this visit. The documentation on 06/20/23 for the exam, diagnosis, procedures, and orders are all accurate and complete.   Jacquelin Hawking, MHS, PA-C [facility]

## 2023-06-20 ENCOUNTER — Other Ambulatory Visit: Payer: Self-pay

## 2023-06-20 NOTE — Assessment & Plan Note (Signed)
Chronic, historic condition Patient reports that she is having to use her albuterol inhaler and nebulizer every 3 to 4 hours/day for relief from SOBOE and dyspnea We discussed her current regimen and she states that she has not been using her Symbicort and has been relying instead on her albuterol nebulizer We discussed that her Symbicort is her maintenance medication and that she needs to take this as directed every day to help prevent asthma symptoms and that her albuterol inhaler and nebulizer are for acute exacerbations. I have sent in a DME order for a new nebulizer per her request I recommend follow-up with her PCP as needed for persistent or progressing symptoms

## 2023-06-21 ENCOUNTER — Other Ambulatory Visit: Payer: Self-pay

## 2023-06-28 ENCOUNTER — Ambulatory Visit
Admission: EM | Admit: 2023-06-28 | Discharge: 2023-06-28 | Disposition: A | Discharge status: Home / Self Care | Payer: 59 | Attending: Emergency Medicine | Admitting: Emergency Medicine

## 2023-06-28 DIAGNOSIS — H109 Unspecified conjunctivitis: Secondary | ICD-10-CM

## 2023-06-28 DIAGNOSIS — Z760 Encounter for issue of repeat prescription: Secondary | ICD-10-CM

## 2023-06-28 DIAGNOSIS — R0781 Pleurodynia: Secondary | ICD-10-CM

## 2023-06-28 DIAGNOSIS — Z8709 Personal history of other diseases of the respiratory system: Secondary | ICD-10-CM

## 2023-06-28 MED ORDER — POLYMYXIN B-TRIMETHOPRIM 10000-0.1 UNIT/ML-% OP SOLN: 1.0000 [drp] | Freq: Four times a day (QID) | OPHTHALMIC | 0 refills | Status: DC

## 2023-06-28 MED ORDER — ALBUTEROL SULFATE (2.5 MG/3ML) 0.083% IN NEBU: 2.5000 mg | INHALATION_SOLUTION | Freq: Four times a day (QID) | RESPIRATORY_TRACT | 0 refills | Status: DC | PRN
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
## 2023-06-28 NOTE — Discharge Instructions (Addendum)
Use the eyedrops as directed.  Follow-up with your eye care provider.  Albuterol nebulizer solution sent to your pharmacy.  Please follow-up with your primary care provider for additional refills.    Tylenol or ibuprofen as needed for discomfort.

## 2023-06-28 NOTE — ED Triage Notes (Addendum)
Patient to Urgent Care with complaints of right eye drainage that started "a while ago" (reports at least two years). Seen by her eye doctor multiple times and diagnosed with allergies. Believes it could be related to her apartment (damp). Reports right eye constantly leaking. Crusted shut when she wakes up. States she keeps rubbing her eyes. Eye has been swelling.   Reports she also feels like her asthma is flaring- wheezing and coughing. Using inhaler/ neb treatments but is out of her ventolin.   States that she rolled over yesterday and her knuckle got stuck in the left side and now having pain.

## 2023-07-17 ENCOUNTER — Ambulatory Visit: Payer: 59 | Admitting: Internal Medicine

## 2023-09-04 ENCOUNTER — Other Ambulatory Visit: Payer: Self-pay

## 2023-09-04 ENCOUNTER — Encounter: Payer: Self-pay | Admitting: Sports Medicine

## 2023-09-04 ENCOUNTER — Ambulatory Visit (INDEPENDENT_AMBULATORY_CARE_PROVIDER_SITE_OTHER): Payer: 59 | Admitting: Sports Medicine

## 2023-09-04 ENCOUNTER — Ambulatory Visit: Payer: 59 | Admitting: Sports Medicine

## 2023-09-04 DIAGNOSIS — Z6839 Body mass index (BMI) 39.0-39.9, adult: Secondary | ICD-10-CM | POA: Diagnosis not present

## 2023-09-04 DIAGNOSIS — M160 Bilateral primary osteoarthritis of hip: Secondary | ICD-10-CM

## 2023-09-04 DIAGNOSIS — E669 Obesity, unspecified: Secondary | ICD-10-CM

## 2023-09-04 DIAGNOSIS — M25551 Pain in right hip: Secondary | ICD-10-CM | POA: Diagnosis not present

## 2023-09-04 MED ORDER — LIDOCAINE HCL 1 % IJ SOLN
4.0000 mL | INTRAMUSCULAR | Status: AC | PRN
Start: 2023-09-04 — End: 2023-09-04
  Administered 2023-09-04: 4 mL

## 2023-09-04 MED ORDER — METHYLPREDNISOLONE ACETATE 40 MG/ML IJ SUSP
80.0000 mg | INTRAMUSCULAR | Status: AC | PRN
Start: 2023-09-04 — End: 2023-09-04
  Administered 2023-09-04: 80 mg via INTRA_ARTICULAR
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
## 2023-10-26 ENCOUNTER — Telehealth (INDEPENDENT_AMBULATORY_CARE_PROVIDER_SITE_OTHER): Payer: 59 | Admitting: Internal Medicine

## 2023-10-26 ENCOUNTER — Encounter: Payer: Self-pay | Admitting: Internal Medicine

## 2023-10-26 DIAGNOSIS — K529 Noninfective gastroenteritis and colitis, unspecified: Secondary | ICD-10-CM | POA: Diagnosis not present

## 2023-10-26 DIAGNOSIS — H1033 Unspecified acute conjunctivitis, bilateral: Secondary | ICD-10-CM | POA: Diagnosis not present

## 2023-10-26 DIAGNOSIS — R112 Nausea with vomiting, unspecified: Secondary | ICD-10-CM

## 2023-10-26 MED ORDER — ERYTHROMYCIN 5 MG/GM OP OINT
1.0000 | TOPICAL_OINTMENT | Freq: Every day | OPHTHALMIC | 0 refills | Status: DC
Start: 2023-10-26 — End: 2024-08-27

## 2023-10-26 MED ORDER — ONDANSETRON 4 MG PO TBDP
4.0000 mg | ORAL_TABLET | Freq: Three times a day (TID) | ORAL | 0 refills | Status: DC | PRN
Start: 2023-10-26 — End: 2024-08-27

## 2023-10-26 NOTE — Progress Notes (Signed)
Virtual Visit via Video Note  I connected with Diana Galvan on 10/26/23 at  2:00 PM EDT by a video enabled telemedicine application and verified that I am speaking with the correct person using two identifiers.  Location: Patient: Home Provider: [facility]   I discussed the limitations of evaluation and management by telemedicine and the availability of in person appointments. The patient expressed understanding and agreed to proceed.  History of Present Illness:  Patient presenting via telemedicine to discuss abdominal pain and nausea that started yesterday. 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 Was fine all day yesterday, came home from a friend's house and immediately threw up. Has been nauseous since and vomiting every 20 minutes. Now dry heaving. Cannot keep water down. No abdominal pain, no fevers. No urinary symptoms, does have increased urinary frequency but no other urinary symptoms.   Was seen for bilateral conjunctivitis in the summer, was prescribed an antibiotic. She states now she is having similar symptoms again in both eyes, red, painful and watery. No purulent drainage, no pain with EOM.     Observations/Objective:  General: sick appearing, no acute distress ENT: conjunctiva with mild erythema bilaterally, no drainage noted Skin: no rashes, cyanosis or abnormal bruising noted Neuro: answers questions appropriately   Assessment and Plan:  1. Gastroenteritis/Nausea and vomiting, unspecified vomiting type: Discussed how her symptoms are most likely viral in nature, will prescribe Zofran to use as needed. Recommend increasing fluids and a bland diet. Patient will follow up if symptoms worsen or fail to improve. Work note given.  - ondansetron (ZOFRAN-ODT) 4 MG disintegrating tablet; Take 1 tablet (4 mg total) by mouth every 8 (eight) hours as needed for nausea or vomiting.  Dispense: 20 tablet; Refill: 0  2. Acute  conjunctivitis of both eyes, unspecified acute conjunctivitis type: Difficult to assess over virtual but will prescribe antibiotic ointment. As she has had multiple infections this year she will need to be evaluated by Ophthalmology.   - erythromycin ophthalmic ointment; Place 1 Application into both eyes at bedtime.  Dispense: 3.5 g; Refill: 0   Follow Up Instructions: January for CPE    I discussed the assessment and treatment plan with the patient. The patient was provided an opportunity to ask questions and all were answered. The patient agreed with the plan and demonstrated an understanding of the instructions.   The patient was advised to call back or seek an in-person evaluation if the symptoms worsen or if the condition fails to improve as anticipated.  I provided 15 minutes of non-face-to-face time during this encounter.   Margarita Mail, DO

## 2023-10-27 ENCOUNTER — Encounter: Payer: Self-pay | Admitting: Internal Medicine

## 2023-10-27 ENCOUNTER — Ambulatory Visit: Payer: Self-pay | Admitting: *Deleted

## 2023-10-27 ENCOUNTER — Encounter: Payer: Self-pay | Admitting: *Deleted

## 2023-10-27 NOTE — Telephone Encounter (Signed)
Reason for Disposition  Caller has medicine question only, adult not sick, AND triager answers question    She's going to call back in and give an update this afternoon on how the Zofran does by 3:00.  Answer Assessment - Initial Assessment Questions 1. NAME of MEDICINE: "What medicine(s) are you calling about?"     Zofran 2. QUESTION: "What is your question?" (e.g., double dose of medicine, side effect)     Can it make me sleepy?   I need to stay awake for today.    I have to do a double tomorrow.   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   Dr. Caralee Ates said I could go in to tomorrow for work as long as I wasn't vomiting.     I need something to take so it won't make me sleepy.  I don't know if the Zofran will make me too sleepy to work tomorrow.    She has not picked it up from the pharmacy yet.   I suggested she get the Zofran and see how it does for her.   It affects people differently.   She may or may not be sleepy from it.    I suggested Pepto Bismol as an alternative that would not make her sleepy.        I can't sleep because of vomiting and nausea.   I did a video visit with Dr. Caralee Ates yesterday.    She said I have gastroenteritis.    Pt. Is going to call back later today to give an update on how the Zofran did for her.   3. PRESCRIBER: "Who prescribed the medicine?" Reason: if prescribed by specialist, call should be referred to that group.     Dr. Caralee Ates 4. SYMPTOMS: "Do you have any symptoms?" If Yes, ask: "What symptoms are you having?"  "How bad are the symptoms (e.g., mild, moderate, severe)     I'm afraid it will make me sleepy.     I'm still feeling bad and nauseated.   I feel terrible.  I'm having nausea this morning but I have to stay awake because they are coming to install windows.   5. PREGNANCY:  "Is there any chance that you are pregnant?" "When was your last menstrual period?"     Not asked due to age  Protocols used: Medication Question Call-A-AH

## 2023-10-27 NOTE — Telephone Encounter (Signed)
This encounter was created in error - please disregard.

## 2023-10-27 NOTE — Telephone Encounter (Signed)
Patient returned call and reports after taking zofran at approx 10:15 am she is still feeling nausea. Has not vomited . Patient concerned she will continue to have nausea tomorrow when she is to go to work. Patient reports she is going to take zofran again tonight at 7 p. Reports upper abdominal pain comes and goes after eating or drinking. Gagging at times no vomiting. Patient would like an updated letter for work incase she has nausea when she is to report to work. Patient requesting to send via My chart message . Appt scheduled 10/30/23 and reports she could not come in today . Please advise.

## 2023-10-27 NOTE — Telephone Encounter (Signed)
  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   Has not picked it up from the pharmacy but will get it today and take it after they finish installing windows.    Symptoms: Still having nausea. Frequency: This morning still feeling bad Pertinent Negatives: Patient denies vomiting this morning Disposition: [] ED /[] Urgent Care (no appt availability in office) / [] Appointment(In office/virtual)/ []  [facility]/ [] Home Care/ [] Refused Recommended Disposition /[] [place]/ [x]  Follow-up with PCP Additional Notes: She is going to call in an update this afternoon on how the Zofran is doing for her.   May want something for nausea that will not make her sleepy. She mentioned she's had pancreatitis before.    Pt will call back in around 2:30-3:00 this evening to give an update.   She wanted me to let Dr. Caralee Ates know she would be calling.    Message sent.

## 2023-10-29 ENCOUNTER — Encounter: Payer: Self-pay | Admitting: Internal Medicine

## 2023-10-30 ENCOUNTER — Ambulatory Visit (INDEPENDENT_AMBULATORY_CARE_PROVIDER_SITE_OTHER): Payer: Medicaid Other | Admitting: Family Medicine

## 2023-10-30 ENCOUNTER — Other Ambulatory Visit: Payer: Self-pay | Admitting: *Deleted

## 2023-10-30 ENCOUNTER — Other Ambulatory Visit: Payer: Self-pay

## 2023-10-30 ENCOUNTER — Telehealth: Payer: Self-pay | Admitting: Internal Medicine

## 2023-10-30 ENCOUNTER — Encounter: Payer: Self-pay | Admitting: Family Medicine

## 2023-10-30 ENCOUNTER — Encounter: Payer: Self-pay | Admitting: Internal Medicine

## 2023-10-30 VITALS — BP 126/84 | HR 88 | Temp 94.1°F | Resp 16 | Ht 67.0 in | Wt 247.6 lb

## 2023-10-30 DIAGNOSIS — K529 Noninfective gastroenteritis and colitis, unspecified: Secondary | ICD-10-CM

## 2023-10-30 DIAGNOSIS — R112 Nausea with vomiting, unspecified: Secondary | ICD-10-CM

## 2023-10-30 DIAGNOSIS — R61 Generalized hyperhidrosis: Secondary | ICD-10-CM

## 2023-10-30 DIAGNOSIS — R1084 Generalized abdominal pain: Secondary | ICD-10-CM

## 2023-10-30 MED ORDER — ONDANSETRON 4 MG PO TBDP
4.0000 mg | ORAL_TABLET | Freq: Three times a day (TID) | ORAL | 0 refills | Status: DC | PRN
Start: 2023-10-30 — End: 2023-10-31
  Filled 2023-10-30: qty 20, 4d supply, fill #0

## 2023-10-30 MED ORDER — DICYCLOMINE HCL 20 MG PO TABS
20.0000 mg | ORAL_TABLET | Freq: Three times a day (TID) | ORAL | 1 refills | Status: DC | PRN
Start: 2023-10-30 — End: 2024-08-27

## 2023-10-30 MED ORDER — FAMOTIDINE 40 MG PO TABS
20.0000 mg | ORAL_TABLET | Freq: Two times a day (BID) | ORAL | 1 refills | Status: DC | PRN
Start: 2023-10-30 — End: 2023-10-30
  Filled 2023-10-30: qty 60, 60d supply, fill #0

## 2023-10-30 MED ORDER — FAMOTIDINE 40 MG PO TABS
20.0000 mg | ORAL_TABLET | Freq: Two times a day (BID) | ORAL | 1 refills | Status: DC | PRN
Start: 2023-10-30 — End: 2024-08-27

## 2023-10-30 MED ORDER — DICYCLOMINE HCL 20 MG PO TABS
20.0000 mg | ORAL_TABLET | Freq: Three times a day (TID) | ORAL | 1 refills | Status: DC | PRN
Start: 2023-10-30 — End: 2023-10-30
  Filled 2023-10-30: qty 20, 7d supply, fill #0

## 2023-10-30 NOTE — Telephone Encounter (Signed)
Pt requesting a return call would like to speak with Dr Caralee Ates [phone]

## 2023-10-30 NOTE — Telephone Encounter (Signed)
Pt upset, Zofran "The other dosage ordered" needs to be called in to [facility] on [address]  Pt is at pharmacy. Med non delegated, cannt call in. Please advise. Pt would like med tonight, is at pharmacy.

## 2023-10-30 NOTE — Telephone Encounter (Signed)
Pt is calling in because her medications were sent to the wrong pharmacy. Pt says they were supposed to be sent to [facility] on [address] not [facility] pharmacy. dicyclomine (BENTYL) 20 MG tablet [[id number]] famotidine (PEPCID) 40 MG tablet [[id number]] ondansetron (ZOFRAN-ODT) 4 MG disintegrating tablet [[id number]]

## 2023-10-30 NOTE — Telephone Encounter (Signed)
Requested Prescriptions  Pending Prescriptions Disp Refills   famotidine (PEPCID) 40 MG tablet 60 tablet 1    Sig: Take 0.5 tablets (20 mg total) by mouth 2 (two) times daily as needed for heartburn or indigestion (abdominal pain/burning). OTC     Gastroenterology:  H2 Antagonists Passed - 10/30/2023  5:03 PM      Passed - Valid encounter within last 12 months    Recent Outpatient Visits           Today Nausea and vomiting, unspecified vomiting type   [facility] Danelle Berry, PA-C   4 days ago Gastroenteritis   [facility] Margarita Mail, DO   4 months ago Acute conjunctivitis of both eyes, unspecified acute conjunctivitis type   [facility] Mecum, Erin E, PA-C   8 months ago Asthma, unspecified asthma severity, unspecified whether complicated, unspecified whether persistent   [facility] Margarita Mail, DO   8 months ago Asthma with acute exacerbation, unspecified asthma severity, unspecified whether persistent   [facility] Danelle Berry, PA-C               dicyclomine (BENTYL) 20 MG tablet 20 tablet 1    Sig: Take 1 tablet (20 mg total) by mouth 3 (three) times daily as needed for spasms (abd cramping/spasms).     Gastroenterology:  Antispasmodic Agents Passed - 10/30/2023  5:03 PM      Passed - Valid encounter within last 12 months    Recent Outpatient Visits           Today Nausea and vomiting, unspecified vomiting type   [facility] Danelle Berry, PA-C   4 days ago Gastroenteritis   [facility] Margarita Mail, DO   4 months ago Acute conjunctivitis of both eyes, unspecified acute conjunctivitis type   [facility] Mecum, Erin E, PA-C   8 months ago Asthma, unspecified asthma severity, unspecified whether complicated, unspecified whether persistent    [facility] Margarita Mail, DO   8 months ago Asthma with acute exacerbation, unspecified asthma severity, unspecified whether persistent   [facility] Danelle Berry, [place]
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
## 2023-10-31 MED ORDER — ONDANSETRON 4 MG PO TBDP
4.0000 mg | ORAL_TABLET | Freq: Three times a day (TID) | ORAL | 0 refills | Status: DC | PRN
Start: 2023-10-31 — End: 2024-08-27

## 2023-11-01 ENCOUNTER — Encounter: Payer: Self-pay | Admitting: Internal Medicine

## 2023-11-05 ENCOUNTER — Encounter: Payer: Self-pay | Admitting: Internal Medicine

## 2023-11-07 ENCOUNTER — Telehealth: Payer: Self-pay | Admitting: Orthopaedic Surgery

## 2023-11-07 NOTE — Telephone Encounter (Signed)
Pt called in stating her Hip pain is not getting any better and it is possibly rooting from back pain and the injection she got with Shon Baton on 09/09 wore off a while ago. Pt would like to know what else she can do and what are her other possible options as far as injections. Please advise

## 2023-11-08 ENCOUNTER — Encounter: Payer: Self-pay | Admitting: Internal Medicine

## 2023-11-08 ENCOUNTER — Telehealth: Payer: Self-pay | Admitting: Sports Medicine

## 2023-11-08 ENCOUNTER — Telehealth (INDEPENDENT_AMBULATORY_CARE_PROVIDER_SITE_OTHER): Payer: 59 | Admitting: Internal Medicine

## 2023-11-08 DIAGNOSIS — H10403 Unspecified chronic conjunctivitis, bilateral: Secondary | ICD-10-CM | POA: Diagnosis not present

## 2023-11-08 MED ORDER — CARBOXYMETHYLCELLULOSE SODIUM 0.5 % OP SOLN
1.0000 [drp] | Freq: Three times a day (TID) | OPHTHALMIC | 0 refills | Status: DC | PRN
Start: 2023-11-08 — End: 2024-08-27

## 2023-11-08 NOTE — Telephone Encounter (Signed)
Received call from patient. She would like copy of her medical records. I advised need to sign authorization. I emailed auth to her per her request to [email or web address]. she stated she will fax back once completed. pts ph [phone]

## 2023-11-08 NOTE — Progress Notes (Signed)
Virtual Visit via Video Note  I connected with Diana Galvan on 11/08/23 at 10:20 AM EST by a video enabled telemedicine application and verified that I am speaking with the correct person using two identifiers.  Location: Patient: Home Provider: [facility]   I discussed the limitations of evaluation and management by telemedicine and the availability of in person appointments. The patient expressed understanding and agreed to proceed.  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 She was seen twice for this issue and given 2 doctor's notes but still lost her job. She also is wanting to discuss disability because of her hip pain but she does have an Orthopedic doctor.   She is still having ongoing episodes of conjunctivitis. No eye pain or vision changes but will have redness and sometimes will find gnats in her eyes. She tries to keep her eyelids and eyelashes clean but still has this problem and is requesting a referral to ophthalmology.    Observations/Objective:  General: well appearing, no acute distress ENT: conjunctiva normal appearing bilaterally  Skin: no rashes, cyanosis or abnormal bruising noted   Assessment and Plan:  1. 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 Recommend keeping eyelids and eyelashes clean and will prescribe a refresh lubricating drop. Will place referral to Opthalmology.   - Ambulatory referral to Ophthalmology - carboxymethylcellulose (REFRESH PLUS) 0.5 % SOLN; Place 1 drop into both eyes 3 (three) times daily as needed.  Dispense: 30 mL; Refill: 0  In regards to the question of disability, we discussed that this is not something I could help her with. 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 I do not think she would qualify for disability and therefore will not be filling out paperwork in this regard. She does have an orthopedic specialist and I recommend she discuss her chronic hip pain there.   Follow Up Instructions: CPE in January     I discussed the assessment and treatment plan with the patient. The patient was provided an opportunity to ask questions and all were answered. The patient agreed with the plan and demonstrated an understanding of the instructions.   The patient was advised to call back or seek an in-person evaluation if the symptoms worsen or if the condition fails to improve as anticipated.  I provided 30 minutes of non-face-to-face time during this encounter.   Margarita Mail, DO

## 2023-11-09 ENCOUNTER — Telehealth (HOSPITAL_BASED_OUTPATIENT_CLINIC_OR_DEPARTMENT_OTHER): Payer: Self-pay

## 2023-11-09 NOTE — Telephone Encounter (Signed)
error 

## 2023-11-09 NOTE — Telephone Encounter (Signed)
Called patient about scheduling upcoming appointments. She says that she got minimal relief from the hip injection in September and believes that the pain is coming from her back more than her hip. She would like to be reevaluated for back pain and potentially get new x-rays, and is inquiring about an epidural injection rather than a repeat hip injection. Patient says that she has to drive from out of town for these appointments and would like to be seen for evaluation and injection on the same day for financial reasons.

## 2023-11-09 NOTE — Telephone Encounter (Signed)
Called and scheduled pt to see Dr. Steward Drone on 11/27

## 2023-11-11 ENCOUNTER — Encounter: Payer: Self-pay | Admitting: Internal Medicine

## 2023-11-13 ENCOUNTER — Encounter (HOSPITAL_BASED_OUTPATIENT_CLINIC_OR_DEPARTMENT_OTHER): Payer: Self-pay

## 2023-11-13 ENCOUNTER — Ambulatory Visit: Payer: Medicaid Other

## 2023-11-13 DIAGNOSIS — Z23 Encounter for immunization: Secondary | ICD-10-CM

## 2023-11-13 DIAGNOSIS — Z719 Counseling, unspecified: Secondary | ICD-10-CM

## 2023-11-13 NOTE — Progress Notes (Signed)
In nurse clinic for immunizations. Patient requesting Flu and Hep B vaccine. Voices no concerns. VIS reviewed and given to patient. Vaccines (Flu, Heplisav B) tolerated well; no issues noted. [facility] updated and copy given to patient.  Abagail Kitchens, RN

## 2023-11-22 ENCOUNTER — Encounter (HOSPITAL_BASED_OUTPATIENT_CLINIC_OR_DEPARTMENT_OTHER): Payer: Self-pay | Admitting: Orthopaedic Surgery

## 2023-11-22 ENCOUNTER — Ambulatory Visit (HOSPITAL_BASED_OUTPATIENT_CLINIC_OR_DEPARTMENT_OTHER): Payer: 59 | Admitting: Orthopaedic Surgery

## 2023-12-15 ENCOUNTER — Ambulatory Visit: Payer: Self-pay | Admitting: *Deleted

## 2023-12-15 NOTE — Telephone Encounter (Signed)
  Chief Complaint: injury to knee Symptoms: patient fell last night -injured R knee, pain - 6-7/10 Frequency: fell last night Pertinent Negatives: Patient denies swelling, bruising Disposition: [] ED /[] Urgent Care (no appt availability in office) / [x] Appointment(In office/virtual)/ []  [facility]/ [] Home Care/ [] Refused Recommended Disposition /[]  Mobile Bus/ []  Follow-up with PCP Additional Notes: Patient advised needs to be seen- she is at work- unable to cone until after 12. No open appointment- Patient scheduled for Monday afternoon due to her work schedule. Patient is established with Ortho provider- advised her to contact for appointment today
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
## 2023-12-15 NOTE — Telephone Encounter (Signed)
Larey Seat last nigh going to [employer]. Injured knee. Was in a lot of pain heard something pop but can walk. Top of right knee to thigh is hurting. At work now cannot get away. Appointment on Monday. Informed patient to go to [facility] from 9 to 9 opening is any issues before then

## 2023-12-18 ENCOUNTER — Ambulatory Visit: Payer: Medicaid Other | Admitting: Internal Medicine

## 2023-12-22 ENCOUNTER — Ambulatory Visit: Payer: 59 | Admitting: Internal Medicine

## 2023-12-22 ENCOUNTER — Other Ambulatory Visit: Payer: Self-pay | Admitting: Internal Medicine

## 2023-12-22 ENCOUNTER — Ambulatory Visit: Payer: Medicaid Other | Admitting: Internal Medicine

## 2023-12-25 ENCOUNTER — Ambulatory Visit: Payer: 59 | Admitting: Internal Medicine

## 2023-12-25 ENCOUNTER — Other Ambulatory Visit: Payer: Self-pay | Admitting: Internal Medicine

## 2023-12-29 ENCOUNTER — Ambulatory Visit (HOSPITAL_BASED_OUTPATIENT_CLINIC_OR_DEPARTMENT_OTHER): Payer: Medicaid Other | Admitting: Orthopaedic Surgery

## 2024-01-01 ENCOUNTER — Encounter: Payer: 59 | Admitting: Internal Medicine

## 2024-01-02 ENCOUNTER — Telehealth: Payer: Self-pay | Admitting: Internal Medicine

## 2024-01-02 ENCOUNTER — Telehealth (HOSPITAL_BASED_OUTPATIENT_CLINIC_OR_DEPARTMENT_OTHER): Payer: Self-pay | Admitting: Student

## 2024-01-02 ENCOUNTER — Encounter (HOSPITAL_BASED_OUTPATIENT_CLINIC_OR_DEPARTMENT_OTHER): Payer: Self-pay

## 2024-01-02 ENCOUNTER — Ambulatory Visit (HOSPITAL_BASED_OUTPATIENT_CLINIC_OR_DEPARTMENT_OTHER): Payer: Medicaid Other | Admitting: Student

## 2024-01-02 NOTE — Telephone Encounter (Signed)
 I call the patient left a message not sure if she wanted to resch today or not but asked her to call the office to resch

## 2024-01-02 NOTE — Telephone Encounter (Signed)
 Just FYI. The letter that you all sent out dismissing the patient came back as undelivered. I tried calling the patient to get a correct address and no answer or voice mail to be able to leave. Per mine and your conversation I will take you out as PCP.

## 2024-01-09 ENCOUNTER — Telehealth: Payer: Self-pay | Admitting: Pediatrics

## 2024-01-09 ENCOUNTER — Ambulatory Visit: Payer: Self-pay | Admitting: *Deleted

## 2024-01-09 ENCOUNTER — Telehealth: Payer: Medicaid Other | Admitting: Physician Assistant

## 2024-01-09 DIAGNOSIS — J45909 Unspecified asthma, uncomplicated: Secondary | ICD-10-CM

## 2024-01-09 DIAGNOSIS — R490 Dysphonia: Secondary | ICD-10-CM | POA: Diagnosis not present

## 2024-01-09 MED ORDER — ALBUTEROL SULFATE HFA 108 (90 BASE) MCG/ACT IN AERS: 2.0000 | INHALATION_SPRAY | Freq: Four times a day (QID) | RESPIRATORY_TRACT | 0 refills | Status: DC | PRN

## 2024-01-09 MED ORDER — BENZONATATE 100 MG PO CAPS: 100.0000 mg | ORAL_CAPSULE | Freq: Three times a day (TID) | ORAL | 0 refills | Status: AC

## 2024-01-09 MED ORDER — PREDNISONE 50 MG PO TABS: 50.0000 mg | ORAL_TABLET | Freq: Every day | ORAL | 0 refills | Status: AC

## 2024-01-09 MED ORDER — ALBUTEROL SULFATE (2.5 MG/3ML) 0.083% IN NEBU: 2.5000 mg | INHALATION_SOLUTION | Freq: Four times a day (QID) | RESPIRATORY_TRACT | 12 refills | Status: DC | PRN

## 2024-01-09 NOTE — Patient Instructions (Signed)
 Diana Galvan, thank you for joining Lynden GORMAN Snuffer, PA-C for today's virtual visit.  While this provider is not your primary care provider (PCP), if your PCP is located in our provider database this encounter information will be shared with them immediately following your visit.   A [facility] MyChart account gives you access to today's visit and all your visits, tests, and labs performed at [facility]  click here if you don't have a  MyChart account or go to [email or web address]  Consent: (Patient) Diana Galvan provided verbal consent for this virtual visit at the beginning of the encounter.  Current Medications:  Current Outpatient Medications:    albuterol  (PROVENTIL ) (2.5 MG/3ML) 0.083% nebulizer solution, Take 3 mLs (2.5 mg total) by nebulization every 6 (six) hours as needed for wheezing or shortness of breath., Disp: 75 mL, Rfl: 12   albuterol  (VENTOLIN  HFA) 108 (90 Base) MCG/ACT inhaler, Inhale 2 puffs into the lungs every 6 (six) hours as needed for wheezing or shortness of breath., Disp: 8 g, Rfl: 0   benzonatate  (TESSALON ) 100 MG capsule, Take 1 capsule (100 mg total) by mouth every 8 (eight) hours for 5 days., Disp: 15 capsule, Rfl: 0   predniSONE  (DELTASONE ) 50 MG tablet, Take 1 tablet (50 mg total) by mouth daily for 5 days., Disp: 5 tablet, Rfl: 0   albuterol  (PROVENTIL ) (2.5 MG/3ML) 0.083% nebulizer solution, Take 3 mLs (2.5 mg total) by nebulization every 6 (six) hours as needed for wheezing or shortness of breath., Disp: 75 mL, Rfl: 0   albuterol  (VENTOLIN  HFA) 108 (90 Base) MCG/ACT inhaler, Inhale 2 puffs into the lungs every 6 (six) hours as needed for wheezing or shortness of breath., Disp: 18 g, Rfl: 2   budesonide -formoterol  (SYMBICORT ) 160-4.5 MCG/ACT inhaler, Inhale 2 puffs into the lungs 2 (two) times daily., Disp: 1 each, Rfl: 3   carboxymethylcellulose (REFRESH PLUS) 0.5 % SOLN, Place 1 drop into both eyes 3 (three)  times daily as needed., Disp: 30 mL, Rfl: 0   cetirizine  (ZYRTEC ) 10 MG tablet, Take 1 tablet (10 mg total) by mouth daily., Disp: 30 tablet, Rfl: 11   dicyclomine  (BENTYL ) 20 MG tablet, Take 1 tablet (20 mg total) by mouth 3 (three) times daily as needed for spasms (abd cramping/spasms)., Disp: 20 tablet, Rfl: 1   erythromycin  ophthalmic ointment, Place 1 Application into both eyes at bedtime., Disp: 3.5 g, Rfl: 0   famotidine  (PEPCID ) 40 MG tablet, Take 0.5 tablets (20 mg total) by mouth 2 (two) times daily as needed for heartburn or indigestion (abdominal pain/burning). OTC, Disp: 60 tablet, Rfl: 1   fluticasone  (FLONASE ) 50 MCG/ACT nasal spray, Place 1 spray into both nostrils daily., Disp: 16 g, Rfl: 2   ipratropium-albuterol  (DUONEB) 0.5-2.5 (3) MG/3ML SOLN, Take 3 mLs by nebulization every 6 (six) hours as needed., Disp: , Rfl:    montelukast  (SINGULAIR ) 10 MG tablet, Take 1 tablet (10 mg total) by mouth at bedtime., Disp: 30 tablet, Rfl: 3   OLANZapine  (ZYPREXA ) 10 MG tablet, Take 1 tablet (10 mg total) by mouth at bedtime., Disp: 30 tablet, Rfl: 1   ondansetron  (ZOFRAN -ODT) 4 MG disintegrating tablet, Take 1 tablet (4 mg total) by mouth every 8 (eight) hours as needed for nausea or vomiting., Disp: 20 tablet, Rfl: 0   ondansetron  (ZOFRAN -ODT) 4 MG disintegrating tablet, Take 1-2 tablets (4-8 mg total) by mouth every 8 (eight) hours as needed for nausea or vomiting., Disp: 20 tablet, Rfl: 0  Medications ordered in this encounter:  Meds ordered this encounter  Medications   albuterol  (PROVENTIL ) (2.5 MG/3ML) 0.083% nebulizer solution    Sig: Take 3 mLs (2.5 mg total) by nebulization every 6 (six) hours as needed for wheezing or shortness of breath.    Dispense:  75 mL    Refill:  12   predniSONE  (DELTASONE ) 50 MG tablet    Sig: Take 1 tablet (50 mg total) by mouth daily for 5 days.    Dispense:  5 tablet    Refill:  0   albuterol  (VENTOLIN  HFA) 108 (90 Base) MCG/ACT inhaler    Sig:  Inhale 2 puffs into the lungs every 6 (six) hours as needed for wheezing or shortness of breath.    Dispense:  8 g    Refill:  0   benzonatate  (TESSALON ) 100 MG capsule    Sig: Take 1 capsule (100 mg total) by mouth every 8 (eight) hours for 5 days.    Dispense:  15 capsule    Refill:  0     *If you need refills on other medications prior to your next appointment, please contact your pharmacy*  Follow-Up: Call back or seek an in-person evaluation if the symptoms worsen or if the condition fails to improve as anticipated.  [facility] [phone]  Other Instructions Take medications as prescribed.   Follow up with your regular doctor in 1 week for reassessment and seek care sooner if your symptoms worsen or fail to improve.    If you have been instructed to have an in-person evaluation today at a local Urgent Care facility, please use the link below. It will take you to a list of all of our available [facility], including address, phone number and hours of operation. Please do not delay care.  [facility]  If you or a family member do not have a primary care provider, use the link below to schedule a visit and establish care. When you choose a [facility] primary care physician or advanced practice provider, you gain a long-term partner in health. Find a Primary Care Provider  Learn more about [facility]'s in-office and virtual care options:  - Get Care Now

## 2024-01-09 NOTE — Progress Notes (Signed)
 [name], Diana Galvan are scheduled for a virtual visit with your provider today.    Just as we do with appointments in the office, we must obtain your consent to participate.  Your consent will be active for this visit and any virtual visit you may have with one of our providers in the next 365 days.    If you have a MyChart account, I can also send a copy of this consent to you electronically.  All virtual visits are billed to your insurance company just like a traditional visit in the office.  As this is a virtual visit, video technology does not allow for your provider to perform a traditional examination.  This may limit your provider's ability to fully assess your condition.  If your provider identifies any concerns that need to be evaluated in person or the need to arrange testing such as labs, EKG, etc, we will make arrangements to do so.    Although advances in technology are sophisticated, we cannot ensure that it will always work on either your end or our end.  If the connection with a video visit is poor, we may have to switch to a telephone visit.  With either a video or telephone visit, we are not always able to ensure that we have a secure connection.   I need to obtain your verbal consent now.   Are you willing to proceed with your visit today?   Diana Galvan has provided verbal consent on 01/09/2024 for a virtual visit (video or telephone).   Lynden GORMAN Snuffer, PA-C 01/09/2024  11:59 AM   Date:  01/09/2024   ID:  Diana Galvan, DOB Apr 23, 1966, MRN [id number]  Patient Location: Home Provider Location: Home Office   Participants: Patient and Provider for Visit and Wrap up  Method of visit: Video  Location of Patient: Home Location of Provider: Home Office Consent was obtain for visit over the video. Services rendered by provider: Visit was performed via video  A video enabled telemedicine application was used and I verified that I am speaking with the correct  person using two identifiers.  PCP:  No primary care provider on file.   Chief Complaint:  hoarse voice  History of Present Illness:    [name] is a 58 y.o. female with history as stated below. Presents video telehealth for an acute care visit  Pt states that she woke up this AM with a cough, hoarse voice and had an asthma attack. States this caused her to panic. She used her nebulizer with some mild relief. She reports some wheezing and continued cough.  Past Medical, Surgical, Social History, Allergies, and Medications have been Reviewed.  Past Medical History:  Diagnosis Date   Asthma    Coronary artery disease    mild cad in mLAD on coronary CTA   Costochondritis    DDD (degenerative disc disease), lumbar    Depression    History of Erb's palsy    Thyroid disease    Thyroid goiter     Current Meds  Medication Sig   albuterol  (PROVENTIL ) (2.5 MG/3ML) 0.083% nebulizer solution Take 3 mLs (2.5 mg total) by nebulization every 6 (six) hours as needed for wheezing or shortness of breath.   albuterol  (VENTOLIN  HFA) 108 (90 Base) MCG/ACT inhaler Inhale 2 puffs into the lungs every 6 (six) hours as needed for wheezing or shortness of breath.   benzonatate  (TESSALON ) 100 MG capsule Take 1 capsule (100 mg total) by mouth every  8 (eight) hours for 5 days.   predniSONE  (DELTASONE ) 50 MG tablet Take 1 tablet (50 mg total) by mouth daily for 5 days.     Allergies:   Oxycodone-acetaminophen , Adhesive [tape], and Wool alcohol [lanolin]   ROS See HPI for history of present illness.  Physical Exam Constitutional:      Appearance: Normal appearance.  HENT:     Mouth/Throat:     Comments: No stridor Pulmonary:     Effort: Pulmonary effort is normal.     Comments: Speaking in complete paragraphs without difficulty Neurological:     Mental Status: She is alert.               MDM: Pt presenting with cough, wheezing and hoarse voice. Concern for asthma exacerbation.  Will refill neb solution, inhaler, give steroids and cough medication.    Tests Ordered: No orders of the defined types were placed in this encounter.   Medication Changes: Meds ordered this encounter  Medications   albuterol  (PROVENTIL ) (2.5 MG/3ML) 0.083% nebulizer solution    Sig: Take 3 mLs (2.5 mg total) by nebulization every 6 (six) hours as needed for wheezing or shortness of breath.    Dispense:  75 mL    Refill:  12   predniSONE  (DELTASONE ) 50 MG tablet    Sig: Take 1 tablet (50 mg total) by mouth daily for 5 days.    Dispense:  5 tablet    Refill:  0   albuterol  (VENTOLIN  HFA) 108 (90 Base) MCG/ACT inhaler    Sig: Inhale 2 puffs into the lungs every 6 (six) hours as needed for wheezing or shortness of breath.    Dispense:  8 g    Refill:  0   benzonatate  (TESSALON ) 100 MG capsule    Sig: Take 1 capsule (100 mg total) by mouth every 8 (eight) hours for 5 days.    Dispense:  15 capsule    Refill:  0     Disposition:  Follow up  Signed, Amando Chaput GORMAN Snuffer, PA-C  01/09/2024 11:59 AM

## 2024-01-09 NOTE — Telephone Encounter (Signed)
 Pt is calling in because she had an urgent care visit virtually this morning and has a doctor's note from the provider. Pt is having difficulty retrieving the letter and wants to know if someone can send her an email with the letter to [email or web address] so she can print it out.

## 2024-01-09 NOTE — Telephone Encounter (Signed)
 Letter has been printed and emailed to attached email address

## 2024-01-09 NOTE — Telephone Encounter (Signed)
  Chief Complaint: hoarseness s/p asthma attack this am  Symptoms: coarse , hoarseness , slight coughing spells. Tastes mucus, denies SOB Frequency: this am Pertinent Negatives: Patient denies SOB no difficulty breathing  Disposition: [] ED /[] Urgent Care (no appt availability in office) / [] Appointment(In office/virtual)/ [x]  [facility]/ [] Home Care/ [] Refused Recommended Disposition /[] [facility]/ []  Follow-up with PCP Additional Notes:   Patient refused appt with [facility] and reports she is calling to schedule new patient appt at [facility]. Recommended UC or UC VV. Offered to schedule UC VV and patient reports she will attempt to schedule. Offered mobile bus address. Unsure of disposition.  Scheduled paitent new patient appt at [facility] for 01/11/24.          Reason for Disposition  [1] MODERATE to SEVERE hoarseness (e.g., interferes with work) AND [2] professional voice user (e.g., lobbyist, singer, runner, broadcasting/film/video)  (Exception: Current common cold or mild URI symptoms.)  Answer Assessment - Initial Assessment Questions 1. DESCRIPTION: Describe your voice. (e.g., coarse, raspy, weaker, airy, scratchy, deeper)     Coarse raspy  2. SEVERITY: How bad is it?   - MILD: doesn't interfere with normal activities   - MODERATE: interferes with normal activities such as school or work   - SEVERE: only able to whisper.     Interferes with work voice very raspy  3. ONSET: When did the hoarseness begin?     This am  4. COUGH: Is there a cough? If Yes, ask: How bad is it?     Yes  5. FEVER: Do you have a fever? If Yes, ask: What is your temperature, how was it measured, and when did it start?     No  6. ALLERGIES: Any allergy symptoms? If Yes, ask: What are they?     Na  7. IRRITANTS: Do you smoke? Have you been exposed to any irritating fumes? (e.g., smoke)     Na  8. CAUSE: What do you think is causing the hoarseness?     Asthma attack this am while  sleeping 9. OTHER SYMPTOMS: Do you have any other symptoms? (e.g., breathing difficulty, fever, foreign body, lymph node swelling in neck, rash, sore throat, weight loss)      Asthma attack this am woke up with mouth open and hoarseness. Denies difficulty breathing   10. PREGNANCY: Is there any chance you are pregnant? When was your last menstrual period?       na  Protocols used: [facility]

## 2024-01-11 ENCOUNTER — Ambulatory Visit: Payer: Medicaid Other | Admitting: Pediatrics

## 2024-01-22 ENCOUNTER — Ambulatory Visit (HOSPITAL_BASED_OUTPATIENT_CLINIC_OR_DEPARTMENT_OTHER): Payer: Medicaid Other | Admitting: Orthopaedic Surgery

## 2024-01-22 DIAGNOSIS — M1612 Unilateral primary osteoarthritis, left hip: Secondary | ICD-10-CM

## 2024-01-22 DIAGNOSIS — M160 Bilateral primary osteoarthritis of hip: Secondary | ICD-10-CM

## 2024-01-22 DIAGNOSIS — M1611 Unilateral primary osteoarthritis, right hip: Secondary | ICD-10-CM | POA: Diagnosis not present

## 2024-01-22 MED ORDER — LIDOCAINE HCL 1 % IJ SOLN
4.0000 mL | INTRAMUSCULAR | Status: AC | PRN
Start: 2024-01-22 — End: 2024-01-22
  Administered 2024-01-22: 4 mL

## 2024-01-22 MED ORDER — TRIAMCINOLONE ACETONIDE 40 MG/ML IJ SUSP
80.0000 mg | INTRAMUSCULAR | Status: AC | PRN
Start: 2024-01-22 — End: 2024-01-22
  Administered 2024-01-22: 80 mg via INTRA_ARTICULAR

## 2024-01-22 NOTE — Progress Notes (Signed)
Chief Complaint: neck pain and bilateral hip pain     History of Present Illness:   01/22/2024: Today for follow-up of her right hip pain she states that she recently flared up while working at her job at [employer] and has had a difficult time placing weight on the hip at this point  Diana Galvan is a 58 y.o. female presents today with upper trapezial as well as bilateral hip pain.  These been going on for several months although the right hip has been significantly aggravated since January of this year.  She initially went to the emergency room and was given an intramuscular injection with little relief.  This continues to awaken her at night.  She states that upper neck pain is not associated with any type of radiation down the arm.  She has been previously using naproxen as well as lidocaine patches.  These have only of limited relief.  She is currently working from home.  She does have a history of being told that she does have some lumbar stenosis.    Surgical History:   None  PMH/PSH/Family History/Social History/Meds/Allergies:    Past Medical History:  Diagnosis Date  . Asthma   . Coronary artery disease    mild cad in mLAD on coronary CTA  . Costochondritis   . DDD (degenerative disc disease), lumbar   . Depression   . History of Erb's palsy   . Thyroid disease   . Thyroid goiter    Past Surgical History:  Procedure Laterality Date  . APPENDECTOMY    . COLONOSCOPY WITH PROPOFOL N/A 02/03/2020   Procedure: COLONOSCOPY WITH PROPOFOL;  Surgeon: Midge Minium, MD;  Location: [facility] ENDOSCOPY;  Service: Endoscopy;  Laterality: N/A;  Priority 4  . THYROIDECTOMY    . TUBAL LIGATION    . VAGINAL HYSTERECTOMY  10/30/2002   Social History   Socioeconomic History  . Marital status: Divorced    Spouse name: Not on file  . Number of children: 0  . Years of education: Not on file  . Highest education level: Not on file  Occupational  History  . Not on file  Tobacco Use  . Smoking status: Some Days    Current packs/day: 0.25    Average packs/day: 0.3 packs/day for 38.0 years (9.5 ttl pk-yrs)    Types: Cigarettes  . Smokeless tobacco: Never  Vaping Use  . Vaping status: Former  . Substances: Nicotine  Substance and Sexual Activity  . Alcohol use: Not Currently  . Drug use: Not Currently    Types: "Crack" cocaine    Comment: Recovered 2001  . Sexual activity: Yes    Partners: Male    Birth control/protection: None  Other Topics Concern  . Not on file  Social History Narrative  . Not on file   Social Drivers of Health   Financial Resource Strain: Not on file  Food Insecurity: Not on file  Transportation Needs: Not on file  Physical Activity: Not on file  Stress: Not on file  Social Connections: Not on file   Family History  Problem Relation Age of Onset  . Hypertension Mother   . Hyperlipidemia Mother   . Mental illness Mother   . Heart Problems Mother   . Asthma Father   . Heart disease Father   .  Congestive Heart Failure Paternal Aunt   . Heart failure Paternal Aunt   . Heart failure Paternal Grandmother   . Breast cancer Cousin    Allergies  Allergen Reactions  . Oxycodone-Acetaminophen Other (See Comments)  . Adhesive [Tape] Other (See Comments)    Blisters and burning  . Wool Alcohol [Lanolin] Itching   Current Outpatient Medications  Medication Sig Dispense Refill  . albuterol (PROVENTIL) (2.5 MG/3ML) 0.083% nebulizer solution Take 3 mLs (2.5 mg total) by nebulization every 6 (six) hours as needed for wheezing or shortness of breath. 75 mL 0  . albuterol (PROVENTIL) (2.5 MG/3ML) 0.083% nebulizer solution Take 3 mLs (2.5 mg total) by nebulization every 6 (six) hours as needed for wheezing or shortness of breath. 75 mL 12  . albuterol (VENTOLIN HFA) 108 (90 Base) MCG/ACT inhaler Inhale 2 puffs into the lungs every 6 (six) hours as needed for wheezing or shortness of breath. 18 g 2  .  albuterol (VENTOLIN HFA) 108 (90 Base) MCG/ACT inhaler Inhale 2 puffs into the lungs every 6 (six) hours as needed for wheezing or shortness of breath. 8 g 0  . budesonide-formoterol (SYMBICORT) 160-4.5 MCG/ACT inhaler Inhale 2 puffs into the lungs 2 (two) times daily. 1 each 3  . carboxymethylcellulose (REFRESH PLUS) 0.5 % SOLN Place 1 drop into both eyes 3 (three) times daily as needed. 30 mL 0  . cetirizine (ZYRTEC) 10 MG tablet Take 1 tablet (10 mg total) by mouth daily. 30 tablet 11  . dicyclomine (BENTYL) 20 MG tablet Take 1 tablet (20 mg total) by mouth 3 (three) times daily as needed for spasms (abd cramping/spasms). 20 tablet 1  . erythromycin ophthalmic ointment Place 1 Application into both eyes at bedtime. 3.5 g 0  . famotidine (PEPCID) 40 MG tablet Take 0.5 tablets (20 mg total) by mouth 2 (two) times daily as needed for heartburn or indigestion (abdominal pain/burning). OTC 60 tablet 1  . fluticasone (FLONASE) 50 MCG/ACT nasal spray Place 1 spray into both nostrils daily. 16 g 2  . ipratropium-albuterol (DUONEB) 0.5-2.5 (3) MG/3ML SOLN Take 3 mLs by nebulization every 6 (six) hours as needed.    . montelukast (SINGULAIR) 10 MG tablet Take 1 tablet (10 mg total) by mouth at bedtime. 30 tablet 3  . OLANZapine (ZYPREXA) 10 MG tablet Take 1 tablet (10 mg total) by mouth at bedtime. 30 tablet 1  . ondansetron (ZOFRAN-ODT) 4 MG disintegrating tablet Take 1 tablet (4 mg total) by mouth every 8 (eight) hours as needed for nausea or vomiting. 20 tablet 0  . ondansetron (ZOFRAN-ODT) 4 MG disintegrating tablet Take 1-2 tablets (4-8 mg total) by mouth every 8 (eight) hours as needed for nausea or vomiting. 20 tablet 0   No current facility-administered medications for this visit.   No results found.  Review of Systems:   A ROS was performed including pertinent positives and negatives as documented in the HPI.  Physical Exam :   Constitutional: NAD and appears stated age Neurological: Alert  and oriented Psych: Appropriate affect and cooperative There were no vitals taken for this visit.   Comprehensive Musculoskeletal Exam:    Inspection Right Left  Skin No atrophy or gross abnormalities appreciated No atrophy or gross abnormalities appreciated  Palpation    Tenderness None None  Crepitus None None  Range of Motion    Flexion (passive) 120 120  Extension 30 30  IR 30 with pain in groin 30  ER 45 with groin pain 45  Strength    Flexion  5/5 5/5  Extension 5/5 5/5  Special Tests    FABER Negative Negative  FADIR Negative Negative  ER Lag/Capsular Insufficiency Negative Negative  Instability Negative Negative  Sacroiliac pain Negative  Negative   Instability    Generalized Laxity No No  Neurologic    sciatic, femoral, obturator nerves intact to light sensation  Vascular/Lymphatic    DP pulse 2+ 2+  Lumbar Exam    Patient has symmetric lumbar range of motion with negative pain referral to hip  Pain about the right trapezial muscles involving the cervical spine. Tenderness palpation about the right SI joint  Imaging:   Xray (AP pelvis, bilateral hips 2 views): Right worse than left osteoarthritis   I personally reviewed and interpreted the radiographs.   Assessment:   58 year old female with right worse than left hip osteoarthritis as well as lower back pain.  This time she is seeking a right hip femoral acetabular injection which I did provide after verbal consent was obtained.  She would like to further discuss candidacy for possible total hip with Dr. Magnus Ivan.  I do believe this is reasonable. Plan :    -Right hip ultrasound-guided injection provided after verbal consent obtained    Procedure Note  Patient: Diana Galvan             Date of Birth: [birth year]           MRN: [id number]             Visit Date: 01/22/2024  Procedures: Visit Diagnoses: No diagnosis found.  Large Joint Inj: R hip joint on 01/22/2024 12:52 PM Indications:  pain Details: 22 G 3.5 in needle, ultrasound-guided anterolateral approach  Arthrogram: No  Medications: 4 mL lidocaine 1 %; 80 mg triamcinolone acetonide 40 MG/ML Outcome: tolerated well, no immediate complications Procedure, treatment alternatives, risks and benefits explained, specific risks discussed. Consent was given by the patient. Immediately prior to procedure a time out was called to verify the correct patient, procedure, equipment, support staff and site/side marked as required. Patient was prepped and draped in the usual sterile fashion.          I personally saw and evaluated the patient, and participated in the management and treatment plan.  Huel Cote, MD Attending Physician, Orthopedic Surgery  This document was dictated using Dragon voice recognition software. A reasonable attempt at proof reading has been made to minimize errors.

## 2024-01-31 DIAGNOSIS — R0981 Nasal congestion: Secondary | ICD-10-CM | POA: Diagnosis not present

## 2024-01-31 DIAGNOSIS — J029 Acute pharyngitis, unspecified: Secondary | ICD-10-CM | POA: Diagnosis not present

## 2024-01-31 DIAGNOSIS — R059 Cough, unspecified: Secondary | ICD-10-CM | POA: Diagnosis not present

## 2024-02-01 ENCOUNTER — Other Ambulatory Visit: Payer: Self-pay | Admitting: Internal Medicine

## 2024-02-01 DIAGNOSIS — J45909 Unspecified asthma, uncomplicated: Secondary | ICD-10-CM

## 2024-02-02 NOTE — Telephone Encounter (Signed)
 Requested Prescriptions  Pending Prescriptions Disp Refills   albuterol  (VENTOLIN  HFA) 108 (90 Base) MCG/ACT inhaler [Pharmacy Med Name: Albuterol  Sulfate HFA 108 (90 Base) MCG/ACT Inhalation Aerosol Solution] 9 g 0    Sig: INHALE 2 PUFFS BY MOUTH EVERY 6 HOURS AS NEEDED FOR WHEEZING OR SHORTNESS OF BREATH     Pulmonology:  Beta Agonists 2 Passed - 02/02/2024 11:32 AM      Passed - Last BP in normal range    BP Readings from Last 1 Encounters:  10/30/23 126/84         Passed - Last Heart Rate in normal range    Pulse Readings from Last 1 Encounters:  10/30/23 88         Passed - Valid encounter within last 12 months    Recent Outpatient Visits           2 months ago Chronic conjunctivitis of both eyes, unspecified chronic conjunctivitis type   [facility] Bernardo Fend, DO   3 months ago Nausea and vomiting, unspecified vomiting type   [facility] Leavy Mole, PA-C   3 months ago Gastroenteritis   [facility] Bernardo Fend, DO   7 months ago Acute conjunctivitis of both eyes, unspecified acute conjunctivitis type   [facility] Mecum, Rocky BRAVO, PA-C   11 months ago Asthma, unspecified asthma severity, unspecified whether complicated, unspecified whether persistent   [facility] Bernardo Fend, DO       Future Appointments             In 1 month Herold, Hadassah SQUIBB, MD [facility]

## 2024-02-15 ENCOUNTER — Telehealth: Payer: Self-pay | Admitting: Cardiology

## 2024-02-15 NOTE — Telephone Encounter (Signed)
Called [facility]  Spoke with [place].   Made ashley aware that I have received a fax from them and that the patient was last seen in 2023.   Made her aware patient would need an appt if this was regarding cardiology.   She stated she would reach out to the patient and make her aware as well.

## 2024-02-15 NOTE — Telephone Encounter (Signed)
[facility] is calling to get confirmation that a fax was received by office and is requesting call back.

## 2024-02-28 ENCOUNTER — Encounter: Payer: Self-pay | Admitting: *Deleted

## 2024-03-06 ENCOUNTER — Ambulatory Visit: Payer: Medicaid Other | Admitting: Pediatrics

## 2024-03-14 DIAGNOSIS — F3181 Bipolar II disorder: Secondary | ICD-10-CM | POA: Diagnosis not present

## 2024-03-25 ENCOUNTER — Telehealth: Payer: Self-pay
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
## 2024-07-03 ENCOUNTER — Ambulatory Visit (HOSPITAL_BASED_OUTPATIENT_CLINIC_OR_DEPARTMENT_OTHER): Admitting: Orthopaedic Surgery

## 2024-07-11 ENCOUNTER — Ambulatory Visit: Payer: Self-pay

## 2024-07-11 NOTE — Telephone Encounter (Signed)
 FYI Only or Action Required?: FYI only for provider.  Patient was last seen in primary care on 11/08/2023 by Bernardo Fend, DO.  Called Nurse Triage reporting Insect Bite.  Symptoms began about a month ago.  Interventions attempted: OTC medications: Calamine lotion.  Symptoms are: stable.  Triage Disposition: See PCP When Office is Open (Within 3 Days)  Patient/caregiver understands and will follow disposition?: Yes                             Copied from CRM [id number]. Topic: Clinical - Red Word Triage >> Jul 11, 2024  2:40 PM Deleta RAMAN wrote: Red Word that prompted transfer to Nurse Triage: patient is calling to schedule an appointment. Mentioned she has asthma, possible blood clot, skin irritation, and bites (spider or allergic reaction) that leads to knots and itching Reason for Disposition  [1] After 14 days AND [2] insect bite isn't healed  Answer Assessment - Initial Assessment Questions 1. TYPE of INSECT: What type of insect was it?      Unsure 2. ONSET: When did you get bitten?      First bite appeared last month 3. LOCATION: Where is the insect bite located?      Buttocks, armpits, under neck , back and left side  4. REDNESS: Is the area red or pink? If Yes, ask: What size is the area of redness? (inches or cm). When did the redness start?     Denies, states bites are normal skin color 5. PAIN: Is there any pain? If Yes, ask: How bad is the pain? (Scale 0-10; or none, mild, moderate, severe)     Sometimes  6. ITCHING: Does it itch? If Yes, ask: How bad is the itch?      Itching starts after touching the bites, states itching feels like it is coming from inside  7. SWELLING: How big is the swelling? (e.g., inches, cm, or compare to coins)     States bites appear as knots 8. OTHER SYMPTOMS: Do you have any other symptoms?  (e.g., difficulty breathing, fever, hives)     Sweating, denies fever, states her toes  have been numb for about a year, dark spot on inside of right ankle, complained of ongoing/chronic hip pain    Patient was dismissed from [facility], due to no shows.  Protocols used: Insect Bite-A-AH

## 2024-07-12 ENCOUNTER — Encounter: Admitting: Family Medicine

## 2024-07-12 ENCOUNTER — Encounter: Payer: Self-pay | Admitting: Family Medicine

## 2024-07-12 NOTE — Patient Instructions (Signed)
 Smoking Cessation: QuitlineNC 1-800-QUIT-NOW 707-701-6721); Espaol: 1-855-Djelo-Ya (1-780-445-4976) [email or web address]

## 2024-07-15 ENCOUNTER — Telehealth: Payer: Self-pay

## 2024-07-15 ENCOUNTER — Encounter: Payer: Self-pay | Admitting: Family Medicine

## 2024-07-15 ENCOUNTER — Telehealth: Payer: Self-pay | Admitting: Family Medicine

## 2024-07-15 ENCOUNTER — Ambulatory Visit (INDEPENDENT_AMBULATORY_CARE_PROVIDER_SITE_OTHER): Admitting: Family Medicine

## 2024-07-15 VITALS — BP 115/79 | HR 79 | Resp 16 | Ht 67.0 in | Wt 253.2 lb

## 2024-07-15 DIAGNOSIS — R200 Anesthesia of skin: Secondary | ICD-10-CM

## 2024-07-15 DIAGNOSIS — M51361 Other intervertebral disc degeneration, lumbar region with lower extremity pain only: Secondary | ICD-10-CM | POA: Diagnosis not present

## 2024-07-15 DIAGNOSIS — R7303 Prediabetes: Secondary | ICD-10-CM | POA: Diagnosis not present

## 2024-07-15 DIAGNOSIS — M25551 Pain in right hip: Secondary | ICD-10-CM | POA: Diagnosis not present

## 2024-07-15 DIAGNOSIS — M542 Cervicalgia: Secondary | ICD-10-CM | POA: Diagnosis not present

## 2024-07-15 DIAGNOSIS — Z7689 Persons encountering health services in other specified circumstances: Secondary | ICD-10-CM

## 2024-07-15 DIAGNOSIS — R32 Unspecified urinary incontinence: Secondary | ICD-10-CM | POA: Diagnosis not present

## 2024-07-15 DIAGNOSIS — M5030 Other cervical disc degeneration, unspecified cervical region: Secondary | ICD-10-CM

## 2024-07-15 LAB — POCT GLYCOSYLATED HEMOGLOBIN (HGB A1C): Hemoglobin A1C: 5.3 % (ref 4.0–5.6)

## 2024-07-15 MED ORDER — BLOOD GLUCOSE MONITORING SUPPL DEVI: 1.0000 | Freq: Three times a day (TID) | 0 refills | Status: AC

## 2024-07-15 MED ORDER — LANCET DEVICE MISC: 1.0000 | Freq: Three times a day (TID) | 0 refills | Status: AC

## 2024-07-15 MED ORDER — BLOOD GLUCOSE TEST VI STRP: 1.0000 | ORAL_STRIP | Freq: Three times a day (TID) | 0 refills | Status: AC

## 2024-07-15 MED ORDER — LANCETS MISC. MISC: 1.0000 | Freq: Three times a day (TID) | 0 refills | Status: AC

## 2024-07-15 MED ORDER — DICLOFENAC SODIUM 75 MG PO TBEC
75.0000 mg | DELAYED_RELEASE_TABLET | Freq: Two times a day (BID) | ORAL | 2 refills | Status: DC
Start: 2024-07-15 — End: 2024-07-18
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
## 2024-07-15 NOTE — Telephone Encounter (Signed)
 Pharmacy Patient Advocate Encounter   Received notification from CoverMyMeds that prior authorization for Diclofenac  Sodium 75MG  dr tablets is required/requested.   Insurance verification completed.   The patient is insured through [facility] .   Per test claim: PA required; PA submitted to above mentioned insurance via CoverMyMeds Key/confirmation #/EOC [id number] Status is pending

## 2024-07-15 NOTE — Patient Instructions (Addendum)
 Smoking Cessation: QuitlineNC 1-800-QUIT-NOW [phone]); Espaol: 1-855-Djelo-Ya ([phone]) [email or web address]      MyChart:  For all urgent or time sensitive needs we ask that you please call the office to avoid delays. Our number is [phone]) [phone]. MyChart is not constantly monitored and due to the large volume of messages a day, replies may take up to 72 business hours.   MyChart Policy: MyChart allows for you to see your visit notes, after visit summary, provider recommendations, lab and tests results, make an appointment, request refills, and contact your provider or the office for non-urgent questions or concerns. Providers are seeing patients during normal business hours and do not have built in time to review MyChart messages.  We ask that you allow a minimum of 3 business days for responses to [employer]. For this reason, please do not send urgent requests through MyChart. Please call the office at [phone]. New and ongoing conditions may require a visit. We have virtual and in person visit available for your convenience.  Complex MyChart concerns may require a visit. Your provider may request you schedule a virtual or in person visit to ensure we are providing the best care possible. MyChart messages sent after 11:00 AM on Friday will not be received by the provider until Monday morning.    Lab and Test Results: You will receive your lab and test results on MyChart as soon as they are completed and results have been sent by the lab or testing facility. Due to this service, you will receive your results BEFORE your provider.  I review lab and tests results each morning prior to seeing patients. Some results require collaboration with other providers to ensure you are receiving the most appropriate care. For this reason, we ask that you please allow a minimum of 3-5 business days from the time the ALL results have been received for your provider to  receive and review lab and test results and contact you about these.  Most lab and test result comments from the provider will be sent through MyChart. Your provider may recommend changes to the plan of care, follow-up visits, repeat testing, ask questions, or request an office visit to discuss these results. You may reply directly to this message or call the office at [phone] to provide information for the provider or set up an appointment. In some instances, you will be called with test results and recommendations. Please let us  know if this is preferred and we will make note of this in your chart to provide this for you.    If you have not heard a response to your lab or test results in 5 business days from all results returning to MyChart, please call the office to let us  know. We ask that you please avoid calling prior to this time unless there is an emergent concern. Due to high call volumes, this can delay the resulting process.   After Hours: For all non-emergency after hours needs, please call the office at [phone] and select the option to reach the on-call provider service. On-call services are shared between multiple [place] offices and therefore it will not be possible to speak directly with your provider. On-call providers may provide medical advice and recommendations, but are unable to provide refills for maintenance medications.  For all emergency or urgent medical needs after normal business hours, we recommend that you seek care at the closest Urgent Care or Emergency Department to ensure appropriate treatment in a timely manner.  [facility] has a 24 hour emergency room located on the ground floor for your convenience.    Urgent Concerns During the Business Day Providers are seeing patients from 8AM to 5PM, Monday through Thursday, and 8AM to 12PM on Friday with a busy schedule and are most often not able to respond to non-urgent calls until the end  of the day or the next business day. If you should have URGENT concerns during the day, please call and speak to the nurse or schedule a same day appointment so that we can address your concern without delay.    Thank you, again, for choosing me as your health care partner. I appreciate your trust and look forward to learning more about you.    Evalene Arts, FNP-C

## 2024-07-15 NOTE — Telephone Encounter (Signed)
 Patient submitted a Systems developer form / Request for Medical Information. Last OV 07/15/24.  Copy given to CMA and copy in office file.

## 2024-07-15 NOTE — Addendum Note (Signed)
 Addended by: Miron Marxen A on: 07/15/2024 04:05 PM   Modules accepted: Orders

## 2024-07-15 NOTE — Telephone Encounter (Signed)
 Provider will fill out and we will fax it to her HR listed on form then scan copies.

## 2024-07-15 NOTE — Telephone Encounter (Signed)
 Rcvd CRM stating patient wanted Glucose Meter and strips sent to [facility]. Declined by provider at this time due to A1c of 5.3 glucose tracking is not necessary. Advised patient they have OTC machines she can purchase but we do not feel it is a necessity to check routine blood sugar at this time. Closed CRM.

## 2024-07-16 ENCOUNTER — Encounter: Payer: Self-pay | Admitting: Family Medicine

## 2024-07-16 ENCOUNTER — Telehealth: Payer: Self-pay

## 2024-07-16 NOTE — Telephone Encounter (Signed)
 [employer]-Medicaid plan denied PA for Diclofenac  75 mg. Notified provider and sent denial letter for scanning. If you want a different similar RX sent please let me know so I can contact patient.

## 2024-07-17 ENCOUNTER — Telehealth: Payer: Self-pay

## 2024-07-17 NOTE — Telephone Encounter (Signed)
 Copied from CRM [id number]. Topic: Clinical - Medication Prior Auth >> Jul 17, 2024  8:38 AM Montie POUR wrote: Reason for CRM:  [facility] needs prior authorization for MR LUMBAR SPINE WO CONTRAST. Please call [facility] when it is completed. >> Jul 17, 2024  8:42 AM Montie POUR wrote: Case # [id number] and Case [id number]- Insurance needs additional information and insurance sent fax to office requesting additional information.

## 2024-07-18 ENCOUNTER — Other Ambulatory Visit: Payer: Self-pay | Admitting: Family Medicine

## 2024-07-18 DIAGNOSIS — M549 Dorsalgia, unspecified: Secondary | ICD-10-CM

## 2024-07-18 DIAGNOSIS — M51361 Other intervertebral disc degeneration, lumbar region with lower extremity pain only: Secondary | ICD-10-CM

## 2024-07-18 DIAGNOSIS — M542 Cervicalgia: Secondary | ICD-10-CM

## 2024-07-18 MED ORDER — MELOXICAM 15 MG PO TABS: 15.0000 mg | ORAL_TABLET | Freq: Every day | ORAL | 1 refills | Status: DC

## 2024-07-18 NOTE — Telephone Encounter (Signed)
 Pharmacy Patient Advocate Encounter  Received notification from [facility] that Prior Authorization for  Diclofenac  Sodium 75MG  dr tablets has been DENIED.  Full denial letter will be uploaded to the media tab. See denial reason below.  Here are the policy requirements your request did not meet: Per your health plan's criteria, this drug is covered if you meet the following: One of the following: (1) You have failed two preferred drugs as confirmed by claims history or submission of medical records. The preferred drugs: celecoxib capsule, ibuprofen  (suspension, tablet), indomethacin capsule, meloxicam  tablet, naproxen  tablet (generic for Naprosyn ), naproxen  enteric coated/ delayed release tablet (generic for Naprosyn  EC), sulindac tablet. (2) You cannot use two preferred drugs (please specify contraindication or intolerance). The information provided does not show that you meet the criteria listed above. Please speak with your doctor about your choices. This decision was made per the [facility] of [place]  NonPreferred Drugs Guideline.  PA #/Case ID/Reference #: [id number]

## 2024-07-19 ENCOUNTER — Other Ambulatory Visit: Payer: Self-pay

## 2024-07-19 ENCOUNTER — Telehealth: Payer: Self-pay | Admitting: Family Medicine

## 2024-07-19 DIAGNOSIS — R1030 Lower abdominal pain, unspecified: Secondary | ICD-10-CM

## 2024-07-19 DIAGNOSIS — R102 Pelvic and perineal pain: Secondary | ICD-10-CM

## 2024-07-19 NOTE — Telephone Encounter (Signed)
 Copied from CRM [id number]. Topic: Referral - Status >> Jul 19, 2024 10:58 AM Delon DASEN wrote: Reason for CRM: C[facility] with [facility]- insurance denied lumbar spine MRI-  [phone] x [phone]- can do peer to peer or appeal

## 2024-07-19 NOTE — Telephone Encounter (Signed)
 Copied from CRM [id number]. Topic: Referral - Status >> Jul 19, 2024 10:58 AM Diana Galvan wrote: Reason for CRM: Diana Galvan with [facility]- insurance denied lumbar spine MRI-  [phone] x [phone]- can do peer to peer or appeal >> Jul 19, 2024  2:25 PM Diana Galvan wrote: Patient called.. still needs clarity on her on going pain for 2 years.. unsure why MRI keeps getting denied?  She missed our call.Diana Galvan and wants a call back.

## 2024-07-24 ENCOUNTER — Telehealth: Payer: Self-pay | Admitting: Family Medicine

## 2024-07-24 NOTE — Telephone Encounter (Signed)
 Copied from CRM #[id number]. Topic: Clinical - Medication Prior Auth >> Jul 24, 2024 10:16 AM Montie POUR wrote: Reason for CRM:  Insurance declined the cervical spine complete imaging (Reference # [id number]) and they also declined the lumbar spine complete (Reference # [id number]).  Please call [facility] or [employer]. [employer]'s number is [phone] or [phone] if FNP [place] wants to complete a peer to peer . Please call [facility] at [phone] Ext. [phone] with any questions. Thanks

## 2024-07-29 ENCOUNTER — Ambulatory Visit: Payer: Self-pay | Admitting: Family Medicine

## 2024-07-30 ENCOUNTER — Encounter (HOSPITAL_BASED_OUTPATIENT_CLINIC_OR_DEPARTMENT_OTHER): Payer: Self-pay | Admitting: Orthopaedic Surgery

## 2024-07-31 ENCOUNTER — Ambulatory Visit (HOSPITAL_BASED_OUTPATIENT_CLINIC_OR_DEPARTMENT_OTHER): Admitting: Orthopaedic Surgery

## 2024-08-06 ENCOUNTER — Ambulatory Visit: Attending: Family Medicine

## 2024-08-06 DIAGNOSIS — Z9181 History of falling: Secondary | ICD-10-CM | POA: Diagnosis present

## 2024-08-06 DIAGNOSIS — M5030 Other cervical disc degeneration, unspecified cervical region: Secondary | ICD-10-CM | POA: Insufficient documentation

## 2024-08-06 DIAGNOSIS — M51361 Other intervertebral disc degeneration, lumbar region with lower extremity pain only: Secondary | ICD-10-CM | POA: Insufficient documentation

## 2024-08-06 DIAGNOSIS — M542 Cervicalgia: Secondary | ICD-10-CM | POA: Diagnosis present

## 2024-08-06 DIAGNOSIS — M25551 Pain in right hip: Secondary | ICD-10-CM | POA: Insufficient documentation

## 2024-08-06 DIAGNOSIS — R262 Difficulty in walking, not elsewhere classified: Secondary | ICD-10-CM | POA: Insufficient documentation

## 2024-08-06 DIAGNOSIS — M5459 Other low back pain: Secondary | ICD-10-CM | POA: Insufficient documentation

## 2024-08-06 DIAGNOSIS — M6281 Muscle weakness (generalized): Secondary | ICD-10-CM | POA: Diagnosis present
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
## 2024-08-07 ENCOUNTER — Other Ambulatory Visit: Payer: Self-pay | Admitting: Internal Medicine

## 2024-08-07 DIAGNOSIS — J45909 Unspecified asthma, uncomplicated: Secondary | ICD-10-CM

## 2024-08-13 ENCOUNTER — Ambulatory Visit

## 2024-08-13 DIAGNOSIS — Z9181 History of falling: Secondary | ICD-10-CM

## 2024-08-13 DIAGNOSIS — M542 Cervicalgia: Secondary | ICD-10-CM

## 2024-08-13 DIAGNOSIS — M25551 Pain in right hip: Secondary | ICD-10-CM | POA: Diagnosis not present

## 2024-08-13 DIAGNOSIS — R262 Difficulty in walking, not elsewhere classified: Secondary | ICD-10-CM

## 2024-08-13 DIAGNOSIS — M6281 Muscle weakness (generalized): Secondary | ICD-10-CM

## 2024-08-13 DIAGNOSIS — M5459 Other low back pain: Secondary | ICD-10-CM
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
## 2024-08-14 ENCOUNTER — Ambulatory Visit
Admission: RE | Admit: 2024-08-14 | Discharge: 2024-08-14 | Disposition: A | Discharge status: Home / Self Care | Source: Ambulatory Visit | Attending: Family Medicine | Admitting: Family Medicine

## 2024-08-14 ENCOUNTER — Ambulatory Visit: Admitting: Family Medicine

## 2024-08-14 ENCOUNTER — Ambulatory Visit

## 2024-08-14 DIAGNOSIS — M5134 Other intervertebral disc degeneration, thoracic region: Secondary | ICD-10-CM | POA: Diagnosis not present

## 2024-08-14 DIAGNOSIS — M549 Dorsalgia, unspecified: Secondary | ICD-10-CM

## 2024-08-14 DIAGNOSIS — R1030 Lower abdominal pain, unspecified: Secondary | ICD-10-CM

## 2024-08-14 DIAGNOSIS — M160 Bilateral primary osteoarthritis of hip: Secondary | ICD-10-CM | POA: Diagnosis not present

## 2024-08-14 DIAGNOSIS — M51361 Other intervertebral disc degeneration, lumbar region with lower extremity pain only: Secondary | ICD-10-CM

## 2024-08-14 DIAGNOSIS — M51371 Other intervertebral disc degeneration, lumbosacral region with lower extremity pain only: Secondary | ICD-10-CM | POA: Diagnosis not present

## 2024-08-14 DIAGNOSIS — M542 Cervicalgia: Secondary | ICD-10-CM

## 2024-08-14 DIAGNOSIS — R102 Pelvic and perineal pain: Secondary | ICD-10-CM

## 2024-08-14 DIAGNOSIS — M50322 Other cervical disc degeneration at C5-C6 level: Secondary | ICD-10-CM | POA: Diagnosis not present

## 2024-08-15 ENCOUNTER — Encounter (INDEPENDENT_AMBULATORY_CARE_PROVIDER_SITE_OTHER): Payer: Self-pay

## 2024-08-15 NOTE — Progress Notes (Signed)
 No show

## 2024-08-19 ENCOUNTER — Telehealth: Payer: Self-pay | Admitting: *Deleted

## 2024-08-19 ENCOUNTER — Other Ambulatory Visit: Payer: Self-pay | Admitting: Medical Genetics

## 2024-08-19 NOTE — Telephone Encounter (Signed)
 Sending mychart message letting patient know that [facility] hopes to read the imaging in the next few days. Once provider see them she will let her know the results.

## 2024-08-20 ENCOUNTER — Ambulatory Visit: Admitting: Physical Therapy

## 2024-08-20 ENCOUNTER — Telehealth: Payer: Self-pay | Admitting: Physical Therapy
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
## 2024-08-21 ENCOUNTER — Encounter: Payer: Self-pay | Admitting: Physical Therapy

## 2024-08-21 ENCOUNTER — Ambulatory Visit: Admitting: Physical Therapy

## 2024-08-21 DIAGNOSIS — M5459 Other low back pain: Secondary | ICD-10-CM

## 2024-08-21 DIAGNOSIS — M25551 Pain in right hip: Secondary | ICD-10-CM

## 2024-08-21 DIAGNOSIS — Z9181 History of falling: Secondary | ICD-10-CM

## 2024-08-21 DIAGNOSIS — R262 Difficulty in walking, not elsewhere classified: Secondary | ICD-10-CM

## 2024-08-21 DIAGNOSIS — M6281 Muscle weakness (generalized): Secondary | ICD-10-CM

## 2024-08-21 DIAGNOSIS — M542 Cervicalgia: Secondary | ICD-10-CM
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
## 2024-08-23 ENCOUNTER — Other Ambulatory Visit
Admission: RE | Admit: 2024-08-23 | Discharge: 2024-08-23 | Disposition: A | Discharge status: Home / Self Care | Payer: Self-pay | Source: Ambulatory Visit | Attending: Medical Genetics | Admitting: Medical Genetics

## 2024-08-26 ENCOUNTER — Encounter: Payer: Self-pay | Admitting: Family Medicine

## 2024-08-27 ENCOUNTER — Ambulatory Visit (INDEPENDENT_AMBULATORY_CARE_PROVIDER_SITE_OTHER)

## 2024-08-27 ENCOUNTER — Ambulatory Visit: Attending: Family Medicine | Admitting: Physical Therapy

## 2024-08-27 ENCOUNTER — Ambulatory Visit (INDEPENDENT_AMBULATORY_CARE_PROVIDER_SITE_OTHER): Admitting: Family Medicine

## 2024-08-27 ENCOUNTER — Encounter: Payer: Self-pay | Admitting: Family Medicine

## 2024-08-27 ENCOUNTER — Encounter: Payer: Self-pay | Admitting: Physical Therapy

## 2024-08-27 ENCOUNTER — Telehealth: Payer: Self-pay

## 2024-08-27 DIAGNOSIS — F3112 Bipolar disorder, current episode manic without psychotic features, moderate: Secondary | ICD-10-CM

## 2024-08-27 DIAGNOSIS — K5900 Constipation, unspecified: Secondary | ICD-10-CM

## 2024-08-27 DIAGNOSIS — M5459 Other low back pain: Secondary | ICD-10-CM | POA: Insufficient documentation

## 2024-08-27 DIAGNOSIS — Z9181 History of falling: Secondary | ICD-10-CM | POA: Diagnosis present

## 2024-08-27 DIAGNOSIS — M5135 Other intervertebral disc degeneration, thoracolumbar region: Secondary | ICD-10-CM

## 2024-08-27 DIAGNOSIS — M6281 Muscle weakness (generalized): Secondary | ICD-10-CM | POA: Insufficient documentation

## 2024-08-27 DIAGNOSIS — R262 Difficulty in walking, not elsewhere classified: Secondary | ICD-10-CM | POA: Insufficient documentation

## 2024-08-27 DIAGNOSIS — M542 Cervicalgia: Secondary | ICD-10-CM | POA: Diagnosis present

## 2024-08-27 DIAGNOSIS — Z111 Encounter for screening for respiratory tuberculosis: Secondary | ICD-10-CM

## 2024-08-27 DIAGNOSIS — M25551 Pain in right hip: Secondary | ICD-10-CM | POA: Diagnosis present

## 2024-08-27 MED ORDER — SEMAGLUTIDE-WEIGHT MANAGEMENT 1.7 MG/0.75ML ~~LOC~~ SOAJ: 1.7000 mg | SUBCUTANEOUS | 0 refills | Status: DC

## 2024-08-27 MED ORDER — POLYETHYLENE GLYCOL 3350 17 GM/SCOOP PO POWD: 17.0000 g | Freq: Every day | ORAL | 2 refills | Status: DC

## 2024-08-27 MED ORDER — SEMAGLUTIDE-WEIGHT MANAGEMENT 0.5 MG/0.5ML ~~LOC~~ SOAJ: 0.5000 mg | SUBCUTANEOUS | 0 refills | Status: AC

## 2024-08-27 MED ORDER — SEMAGLUTIDE-WEIGHT MANAGEMENT 0.25 MG/0.5ML ~~LOC~~ SOAJ: 0.2500 mg | SUBCUTANEOUS | 0 refills | Status: AC

## 2024-08-27 MED ORDER — SEMAGLUTIDE-WEIGHT MANAGEMENT 1 MG/0.5ML ~~LOC~~ SOAJ: 1.0000 mg | SUBCUTANEOUS | 0 refills | Status: AC

## 2024-08-27 MED ORDER — SEMAGLUTIDE-WEIGHT MANAGEMENT 2.4 MG/0.75ML ~~LOC~~ SOAJ: 2.4000 mg | SUBCUTANEOUS | 0 refills | Status: DC
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
## 2024-08-27 NOTE — Progress Notes (Unsigned)
   Established Patient Office Visit  Subjective  Patient ID: Diana Galvan, female    DOB: Feb 28, 1966  Age: 58 y.o. MRN: [id number]  No chief complaint on file.  Back brace  Toilet rise       08/27/2024    2:16 PM 07/15/2024   10:52 AM 10/30/2023    3:17 PM  PHQ9 SCORE ONLY  PHQ-9 Total Score 14 5  0      Data saved with a previous flowsheet row definition      08/27/2024    2:17 PM 07/15/2024   10:55 AM 01/03/2023    9:23 AM  GAD 7 : Generalized Anxiety Score  Nervous, Anxious, on Edge 1 0 1  Control/stop worrying 1 0 3  Worry too much - different things 1 1 3   Trouble relaxing 1 0 2  Restless 0 0 0  Easily annoyed or irritable 3 0 3  Afraid - awful might happen 0 0 2  Total GAD 7 Score 7 1 14   Anxiety Difficulty  Not difficult at all Somewhat difficult      ROS: see HPI     Objective:     BP 109/73   Pulse 92   Resp 16   Ht 5' 7 (1.702 m)   Wt 256 lb (116.1 kg)   SpO2 98%   BMI 40.10 kg/m  BP Readings from Last 3 Encounters:  08/27/24 109/73  07/15/24 115/79  10/30/23 126/84     Physical Exam    Assessment & Plan:  Morbid obesity (HCC) -     Semaglutide -Weight Management; Inject 0.25 mg into the skin once a week for 28 days.  Dispense: 2 mL; Refill: 0 -     Semaglutide -Weight Management; Inject 0.5 mg into the skin once a week for 28 days.  Dispense: 2 mL; Refill: 0 -     Semaglutide -Weight Management; Inject 1 mg into the skin once a week for 28 days.  Dispense: 2 mL; Refill: 0 -     Semaglutide -Weight Management; Inject 1.7 mg into the skin once a week for 28 days.  Dispense: 3 mL; Refill: 0 -     Semaglutide -Weight Management; Inject 2.4 mg into the skin once a week for 28 days.  Dispense: 3 mL; Refill: 0  Constipation, unspecified constipation type -     Polyethylene Glycol 3350 ; Take 17 g by mouth daily. Dissolve 1 capful (17g) in 4-8 ounces of liquid and take by mouth daily.  Dispense: 510 g; Refill: 2     Return if symptoms worsen  or fail to improve.    Evalene Arts, FNP

## 2024-08-27 NOTE — Patient Instructions (Signed)
 MyChart:  For all urgent or time sensitive needs we ask that you please call the office to avoid delays. Our number is [phone]) [phone]. MyChart is not constantly monitored and due to the large volume of messages a day, replies may take up to 72 business hours.   MyChart Policy: MyChart allows for you to see your visit notes, after visit summary, provider recommendations, lab and tests results, make an appointment, request refills, and contact your provider or the office for non-urgent questions or concerns. Providers are seeing patients during normal business hours and do not have built in time to review MyChart messages.  We ask that you allow a minimum of 3 business days for responses to [employer]. For this reason, please do not send urgent requests through MyChart. Please call the office at [phone]. New and ongoing conditions may require a visit. We have virtual and in person visit available for your convenience.  Complex MyChart concerns may require a visit. Your provider may request you schedule a virtual or in person visit to ensure we are providing the best care possible. MyChart messages sent after 11:00 AM on Friday will not be received by the provider until Monday morning.    Lab and Test Results: You will receive your lab and test results on MyChart as soon as they are completed and results have been sent by the lab or testing facility. Due to this service, you will receive your results BEFORE your provider.  I review lab and tests results each morning prior to seeing patients. Some results require collaboration with other providers to ensure you are receiving the most appropriate care. For this reason, we ask that you please allow a minimum of 3-5 business days from the time the ALL results have been received for your provider to receive and review lab and test results and contact you about these.  Most lab and test result comments from the provider will be sent through MyChart.  Your provider may recommend changes to the plan of care, follow-up visits, repeat testing, ask questions, or request an office visit to discuss these results. You may reply directly to this message or call the office at [phone] to provide information for the provider or set up an appointment. In some instances, you will be called with test results and recommendations. Please let us  know if this is preferred and we will make note of this in your chart to provide this for you.    If you have not heard a response to your lab or test results in 5 business days from all results returning to MyChart, please call the office to let us  know. We ask that you please avoid calling prior to this time unless there is an emergent concern. Due to high call volumes, this can delay the resulting process.   After Hours: For all non-emergency after hours needs, please call the office at [phone] and select the option to reach the on-call provider service. On-call services are shared between multiple [place] offices and therefore it will not be possible to speak directly with your provider. On-call providers may provide medical advice and recommendations, but are unable to provide refills for maintenance medications.  For all emergency or urgent medical needs after normal business hours, we recommend that you seek care at the closest Urgent Care or Emergency Department to ensure appropriate treatment in a timely manner.  [facility] has a 24 hour emergency room located on the ground floor for your  convenience.    Urgent Concerns During the Business Day Providers are seeing patients from 8AM to 5PM, Monday through Thursday, and 8AM to 12PM on Friday with a busy schedule and are most often not able to respond to non-urgent calls until the end of the day or the next business day. If you should have URGENT concerns during the day, please call and speak to the nurse or schedule a same day  appointment so that we can address your concern without delay.    Thank you, again, for choosing me as your health care partner. I appreciate your trust and look forward to learning more about you.    Evalene Arts, FNP-C

## 2024-08-27 NOTE — Telephone Encounter (Signed)
 Filed RX for Back brace and Elevated Engineer, civil (consulting) via [employer]. Patient should receive this by delivery to home address that was verified at office visit. I also advised patient who asked when she can start her medication for weight loss that this takes time as we must first see if insurance will cover her weight loss medication and then get the prior auth. She was advised this can take a few days and she can check back next week.

## 2024-08-28 ENCOUNTER — Telehealth: Payer: Self-pay

## 2024-08-28 ENCOUNTER — Other Ambulatory Visit: Payer: Self-pay | Admitting: Family Medicine

## 2024-08-28 DIAGNOSIS — R29898 Other symptoms and signs involving the musculoskeletal system: Secondary | ICD-10-CM

## 2024-08-28 NOTE — Telephone Encounter (Signed)
 Diana Galvan ([name])  Rx #: [id number]  Wegovy  0.25MG /0.5ML auto-injectors  Form [employer] Electronic Prior Authorization Form [phone] NCPDP)

## 2024-08-28 NOTE — Telephone Encounter (Signed)
(  [employer] PYLES)  Rx #: [id number]  Wegovy  0.5MG /0.5ML auto-injectors  Form [employer] Electronic Prior Authorization Form [phone] NCPDP)

## 2024-08-29 ENCOUNTER — Encounter

## 2024-08-30 ENCOUNTER — Ambulatory Visit

## 2024-08-30 ENCOUNTER — Ambulatory Visit: Payer: Self-pay

## 2024-08-30 DIAGNOSIS — Z111 Encounter for screening for respiratory tuberculosis: Secondary | ICD-10-CM

## 2024-08-30 LAB — TB SKIN TEST
Induration: 0 mm
TB Skin Test: NEGATIVE

## 2024-08-30 NOTE — Progress Notes (Signed)
 PPD Reading Note  PPD read and results entered in EpicCare.  Result: 0 mm induration.  Interpretation: Negative  If test not read within 48-72 hours of initial placement, patient advised to repeat in other arm 1-3 weeks after this test.  Allergic reaction: no

## 2024-09-02 ENCOUNTER — Encounter: Payer: Self-pay | Admitting: Family Medicine

## 2024-09-02 ENCOUNTER — Encounter

## 2024-09-02 LAB — GENECONNECT MOLECULAR SCREEN: Genetic Analysis Overall Interpretation: NEGATIVE

## 2024-09-04 ENCOUNTER — Telehealth: Payer: Self-pay

## 2024-09-04 NOTE — Telephone Encounter (Signed)
 Faxed appeal form with office notes and copy is in copy file up front. I also emailed copy to myself.

## 2024-09-05 ENCOUNTER — Encounter: Payer: Self-pay | Admitting: Physical Therapy

## 2024-09-05 ENCOUNTER — Encounter

## 2024-09-05 ENCOUNTER — Encounter: Payer: Self-pay | Admitting: Family Medicine

## 2024-09-05 MED ORDER — OLOPATADINE HCL 0.1 % OP SOLN: 1.0000 [drp] | Freq: Two times a day (BID) | OPHTHALMIC | 0 refills | Status: AC

## 2024-09-05 MED ORDER — POLYMYXIN B-TRIMETHOPRIM 10000-0.1 UNIT/ML-% OP SOLN: 1.0000 [drp] | Freq: Four times a day (QID) | OPHTHALMIC | 0 refills | Status: AC

## 2024-09-07 ENCOUNTER — Other Ambulatory Visit: Payer: Self-pay | Admitting: Internal Medicine

## 2024-09-07 DIAGNOSIS — H1033 Unspecified acute conjunctivitis, bilateral: Secondary | ICD-10-CM

## 2024-09-09 ENCOUNTER — Telehealth: Payer: Self-pay | Admitting: Family Medicine

## 2024-09-09 ENCOUNTER — Encounter

## 2024-09-09 NOTE — Telephone Encounter (Signed)
 Copied from CRM [id number]. Topic: General - Other >> Sep 09, 2024  1:44 PM Rosaria E wrote: Reason for CRM: Stephania SAUNDERS. Calling from [facility] Medicaid called to report that she is searching the practice and pcp NPI however neither are coming up in their system.   Best contact: [phone]

## 2024-09-12 ENCOUNTER — Telehealth: Payer: Self-pay

## 2024-09-12 ENCOUNTER — Encounter

## 2024-09-13 ENCOUNTER — Telehealth: Payer: Self-pay

## 2024-09-13 NOTE — Progress Notes (Signed)
 Complex Care Management Note Care Guide Note  09/13/2024 Name: [name] MRN: [id number] DOB: May 07, 1966   Complex Care Management Outreach Attempts: An unsuccessful telephone outreach was attempted today to offer the patient information about available complex care management services.  Follow Up Plan:  Additional outreach attempts will be made to offer the patient complex care management information and services.   Encounter Outcome:  No Answer  Jeoffrey Buffalo , RMA      [place]  [facility], [facility] Guide  Direct Dial: [phone]  Website: [email or web address]

## 2024-09-16 ENCOUNTER — Ambulatory Visit: Admitting: Cardiology

## 2024-09-16 ENCOUNTER — Ambulatory Visit: Admitting: Physical Therapy

## 2024-09-16 ENCOUNTER — Encounter

## 2024-09-17 ENCOUNTER — Telehealth: Payer: Self-pay

## 2024-09-17 NOTE — Progress Notes (Signed)
 Complex Care Management Note  Care Guide Note 09/17/2024 Name: Diana Galvan MRN: [id number] DOB: 1966/08/06  Diana Galvan is a 58 y.o. year old female who sees Towana Small, FNP for primary care. I reached out to Arland Olivia Deforest by phone today to offer complex care management services.  Ms. Stroschein was given information about Complex Care Management services today including:   The Complex Care Management services include support from the care team which includes your Nurse Care Manager, Clinical Social Worker, or Pharmacist.  The Complex Care Management team is here to help remove barriers to the health concerns and goals most important to you. Complex Care Management services are voluntary, and the patient may decline or stop services at any time by request to their care team member.   Complex Care Management Consent Status: Patient agreed to services and verbal consent obtained.   Follow up plan:  Telephone appointment with complex care management team member scheduled for:  10/01/2024  Encounter Outcome:  Patient Scheduled   Jeoffrey Buffalo , RMA     [place]  [facility], [facility] Guide  Direct Dial: [phone]  Website: [email or web address]

## 2024-09-17 NOTE — Telephone Encounter (Signed)
 Copied from CRM #[id number]. Topic: General - Other >> Sep 17, 2024 12:20 PM Tiffini S wrote: Reason for CRM: Patient had a missed call for  Complex Care Management Outreach Attempts: A second unsuccessful outreach was attempted today to offer the patient with information about available complex care management services.   Follow Up Plan:  Additional outreach attempts will be made to offer the patient complex care management information and services.    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

## 2024-09-17 NOTE — Progress Notes (Signed)
 Complex Care Management Note Care Guide Note  09/17/2024 Name: Diana Galvan MRN: [id number] DOB: [birth year]   Complex Care Management Outreach Attempts: A second unsuccessful outreach was attempted today to offer the patient with information about available complex care management services.  Follow Up Plan:  Additional outreach attempts will be made to offer the patient complex care management information and services.   Encounter Outcome:  No Answer  Jeoffrey Buffalo , RMA     [place]  [facility], [facility] Guide  Direct Dial: [phone]  Website: [email or web address]

## 2024-09-18 ENCOUNTER — Encounter: Payer: Self-pay | Admitting: Physical Therapy

## 2024-09-18 ENCOUNTER — Ambulatory Visit: Admitting: Physical Therapy

## 2024-09-18 DIAGNOSIS — M25551 Pain in right hip: Secondary | ICD-10-CM

## 2024-09-18 DIAGNOSIS — M542 Cervicalgia: Secondary | ICD-10-CM

## 2024-09-18 DIAGNOSIS — M5459 Other low back pain: Secondary | ICD-10-CM

## 2024-09-18 DIAGNOSIS — M6281 Muscle weakness (generalized): Secondary | ICD-10-CM

## 2024-09-18 DIAGNOSIS — R262 Difficulty in walking, not elsewhere classified: Secondary | ICD-10-CM

## 2024-09-18 DIAGNOSIS — Z9181 History of falling: Secondary | ICD-10-CM
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
## 2024-09-19 ENCOUNTER — Encounter

## 2024-09-23 ENCOUNTER — Ambulatory Visit: Admitting: Physical Therapy

## 2024-09-23 ENCOUNTER — Telehealth: Payer: Self-pay | Admitting: Physical Therapy

## 2024-09-23 NOTE — Telephone Encounter (Signed)
 Attempted to call patient notifying patient of missed PT visit scheduled at 6:15pm today. No answer and unable to leave a message because VM full.    Camie SAUNDERS. Juli, PT, DPT 09/23/24, 6:35 PM  [facility] [address] [place], [place] [address] P: [phone] I F: [phone]

## 2024-09-25 ENCOUNTER — Ambulatory Visit

## 2024-09-25 ENCOUNTER — Ambulatory Visit: Attending: Family Medicine | Admitting: Physical Therapy

## 2024-09-25 ENCOUNTER — Encounter: Payer: Self-pay | Admitting: Physical Therapy

## 2024-09-25 DIAGNOSIS — Z9181 History of falling: Secondary | ICD-10-CM | POA: Insufficient documentation

## 2024-09-25 DIAGNOSIS — R262 Difficulty in walking, not elsewhere classified: Secondary | ICD-10-CM | POA: Diagnosis present

## 2024-09-25 DIAGNOSIS — M542 Cervicalgia: Secondary | ICD-10-CM | POA: Insufficient documentation

## 2024-09-25 DIAGNOSIS — M25551 Pain in right hip: Secondary | ICD-10-CM | POA: Insufficient documentation

## 2024-09-25 DIAGNOSIS — M5459 Other low back pain: Secondary | ICD-10-CM | POA: Diagnosis present

## 2024-09-25 DIAGNOSIS — M6281 Muscle weakness (generalized): Secondary | ICD-10-CM | POA: Insufficient documentation
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
## 2024-09-30 ENCOUNTER — Encounter

## 2024-09-30 ENCOUNTER — Ambulatory Visit

## 2024-10-01 ENCOUNTER — Telehealth

## 2024-10-02 ENCOUNTER — Ambulatory Visit

## 2024-10-02 ENCOUNTER — Encounter

## 2024-10-03 ENCOUNTER — Encounter

## 2024-10-07 ENCOUNTER — Telehealth: Payer: Self-pay

## 2024-10-07 ENCOUNTER — Encounter

## 2024-10-07 ENCOUNTER — Ambulatory Visit

## 2024-10-09 ENCOUNTER — Encounter: Admitting: Physical Therapy

## 2024-10-09 ENCOUNTER — Ambulatory Visit: Admitting: Physical Therapy

## 2024-10-10 ENCOUNTER — Encounter

## 2024-10-14 ENCOUNTER — Telehealth: Payer: Self-pay | Admitting: Physical Therapy

## 2024-10-14 ENCOUNTER — Encounter

## 2024-10-14 ENCOUNTER — Ambulatory Visit: Admitting: Family Medicine

## 2024-10-14 ENCOUNTER — Ambulatory Visit: Admitting: Physical Therapy

## 2024-10-14 VITALS — BP 104/69 | HR 97 | Temp 98.4°F | Ht 67.0 in | Wt 256.0 lb

## 2024-10-14 DIAGNOSIS — R1032 Left lower quadrant pain: Secondary | ICD-10-CM | POA: Diagnosis not present

## 2024-10-14 DIAGNOSIS — N898 Other specified noninflammatory disorders of vagina: Secondary | ICD-10-CM | POA: Diagnosis not present

## 2024-10-14 DIAGNOSIS — E66813 Obesity, class 3: Secondary | ICD-10-CM | POA: Diagnosis not present

## 2024-10-14 DIAGNOSIS — R300 Dysuria: Secondary | ICD-10-CM

## 2024-10-14 DIAGNOSIS — Z6841 Body Mass Index (BMI) 40.0 and over, adult: Secondary | ICD-10-CM | POA: Diagnosis not present

## 2024-10-14 NOTE — Patient Instructions (Signed)

## 2024-10-14 NOTE — Telephone Encounter (Signed)
 Spoke with pateint notifying patient of missed PT visit scheduled at 6:15 today. Patient very apologetic and said she lost track of time and was actually about to leave for PT. Confirmed next appt on Wed 10/22 at 6:15pm.    Camie SAUNDERS. Juli, PT, DPT 10/14/24, 6:40 PM  [facility] [address] [place], [place] [address] P: [phone] I F: [phone]
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
## 2024-10-14 NOTE — Telephone Encounter (Signed)
  error

## 2024-10-15 ENCOUNTER — Ambulatory Visit

## 2024-10-16 ENCOUNTER — Encounter: Payer: Self-pay | Admitting: Physical Therapy

## 2024-10-16 ENCOUNTER — Encounter

## 2024-10-16 ENCOUNTER — Ambulatory Visit: Admitting: Physical Therapy

## 2024-10-16 DIAGNOSIS — M6281 Muscle weakness (generalized): Secondary | ICD-10-CM

## 2024-10-16 DIAGNOSIS — M5459 Other low back pain: Secondary | ICD-10-CM

## 2024-10-16 DIAGNOSIS — M542 Cervicalgia: Secondary | ICD-10-CM

## 2024-10-16 DIAGNOSIS — R262 Difficulty in walking, not elsewhere classified: Secondary | ICD-10-CM

## 2024-10-16 DIAGNOSIS — M25551 Pain in right hip: Secondary | ICD-10-CM

## 2024-10-16 DIAGNOSIS — Z9181 History of falling: Secondary | ICD-10-CM

## 2024-10-16 LAB — MICROSCOPIC EXAMINATION
Casts: NONE SEEN /LPF
Epithelial Cells (non renal): >10 /HPF — AB (ref 0–10)
WBC, UA: >30 /HPF — AB (ref 0–5)

## 2024-10-16 LAB — GI PROFILE, STOOL, PCR

## 2024-10-16 LAB — UA/M W/RFLX CULTURE, ROUTINE
Bilirubin, UA: NEGATIVE
Glucose, UA: NEGATIVE
Ketones, UA: NEGATIVE
Nitrite, UA: NEGATIVE
Specific Gravity, UA: 1.022 (ref 1.005–1.030)
Urobilinogen, Ur: 1 mg/dL (ref 0.2–1.0)
pH, UA: 5.5 (ref 5.0–7.5)

## 2024-10-16 LAB — URINE CULTURE, REFLEX: Organism ID, Bacteria: NO GROWTH

## 2024-10-16 LAB — CT, NG, MYCOPLASMAS NAA, URINE
Chlamydia trachomatis, NAA: NEGATIVE
Mycoplasma genitalium NAA: NEGATIVE
Mycoplasma hominis NAA: NEGATIVE
Neisseria gonorrhoeae, NAA: NEGATIVE
Ureaplasma spp NAA: POSITIVE — AB

## 2024-10-16 LAB — NUSWAB VAGINITIS PLUS (VG+)
Candida albicans, NAA: NEGATIVE
Candida glabrata, NAA: NEGATIVE
Chlamydia trachomatis, NAA: NEGATIVE
Neisseria gonorrhoeae, NAA: NEGATIVE
Trich vag by NAA: POSITIVE — AB
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
## 2024-10-17 ENCOUNTER — Ambulatory Visit (LOCAL_COMMUNITY_HEALTH_CENTER)

## 2024-10-17 ENCOUNTER — Encounter

## 2024-10-17 ENCOUNTER — Ambulatory Visit: Payer: Self-pay | Admitting: Family Medicine

## 2024-10-17 DIAGNOSIS — N3000 Acute cystitis without hematuria: Secondary | ICD-10-CM

## 2024-10-17 DIAGNOSIS — Z23 Encounter for immunization: Secondary | ICD-10-CM | POA: Diagnosis not present

## 2024-10-17 DIAGNOSIS — A599 Trichomoniasis, unspecified: Secondary | ICD-10-CM

## 2024-10-17 DIAGNOSIS — B3731 Acute candidiasis of vulva and vagina: Secondary | ICD-10-CM

## 2024-10-17 DIAGNOSIS — Z719 Counseling, unspecified: Secondary | ICD-10-CM

## 2024-10-17 DIAGNOSIS — B9689 Other specified bacterial agents as the cause of diseases classified elsewhere: Secondary | ICD-10-CM

## 2024-10-17 MED ORDER — FLUCONAZOLE 150 MG PO TABS: ORAL_TABLET | ORAL | 0 refills | Status: DC

## 2024-10-17 MED ORDER — SULFAMETHOXAZOLE-TRIMETHOPRIM 800-160 MG PO TABS: 1.0000 | ORAL_TABLET | Freq: Two times a day (BID) | ORAL | 0 refills | Status: AC

## 2024-10-17 MED ORDER — METRONIDAZOLE 500 MG PO TABS: 500.0000 mg | ORAL_TABLET | Freq: Two times a day (BID) | ORAL | 0 refills | Status: AC

## 2024-10-17 NOTE — Progress Notes (Signed)
 Pt presents for Flu and RSV vaccines. Patient counseled and given VIS Patient tolerated well.  Copies of [facility] given.  Standing order reviewed for RSV and consulted with Dr. KYM Helling.  Per standing order RSV given for 59 yo or older by request.  Pt hx of RSV, asthma with hospitalization and requested vaccine.  Kandi KATHEE Glatter, RN

## 2024-10-18 ENCOUNTER — Ambulatory Visit

## 2024-10-18 ENCOUNTER — Telehealth: Payer: Self-pay

## 2024-10-18 NOTE — Patient Instructions (Signed)
 Arland Olivia Deforest - I am sorry I was unable to reach you today for our scheduled appointment. I work with Towana Small, FNP and am calling to support your healthcare needs. Please contact me at [phone] at your earliest convenience. I look forward to speaking with you soon.   Thank you,  Hendricks Her RN, BSN  [place] [facility] RN Case Manager   Direct [phone]

## 2024-10-21 ENCOUNTER — Other Ambulatory Visit: Payer: Self-pay

## 2024-10-21 ENCOUNTER — Encounter

## 2024-10-21 ENCOUNTER — Telehealth: Payer: Self-pay

## 2024-10-21 ENCOUNTER — Encounter: Payer: Self-pay | Admitting: Physical Therapy

## 2024-10-21 ENCOUNTER — Ambulatory Visit: Admitting: Physical Therapy

## 2024-10-21 DIAGNOSIS — M6281 Muscle weakness (generalized): Secondary | ICD-10-CM

## 2024-10-21 DIAGNOSIS — M542 Cervicalgia: Secondary | ICD-10-CM | POA: Diagnosis not present

## 2024-10-21 DIAGNOSIS — R262 Difficulty in walking, not elsewhere classified: Secondary | ICD-10-CM

## 2024-10-21 DIAGNOSIS — Z9181 History of falling: Secondary | ICD-10-CM

## 2024-10-21 DIAGNOSIS — M25551 Pain in right hip: Secondary | ICD-10-CM

## 2024-10-21 DIAGNOSIS — M51361 Other intervertebral disc degeneration, lumbar region with lower extremity pain only: Secondary | ICD-10-CM

## 2024-10-21 DIAGNOSIS — M5459 Other low back pain: Secondary | ICD-10-CM

## 2024-10-21 DIAGNOSIS — M549 Dorsalgia, unspecified: Secondary | ICD-10-CM

## 2024-10-21 DIAGNOSIS — R7303 Prediabetes: Secondary | ICD-10-CM

## 2024-10-21 MED ORDER — GNP DIGITAL WEIGHT SCALE MISC: 0 refills | Status: DC
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
## 2024-10-21 NOTE — Telephone Encounter (Signed)
 [facility] at [phone].  Faxed order for Rx weigh scale to number given by child psychotherapist.

## 2024-10-21 NOTE — Patient Instructions (Signed)
 Visit Information  [name] was given information about Medicaid Managed Care team care coordination services as a part of their [facility] Community Plan Medicaid benefit.   If you would like to schedule transportation through your [facility], please call the following number at least 2 days in advance of your appointment: [phone]   Rides for urgent appointments can also be made after hours by calling Member Services.  Call the Behavioral Health Crisis Line at [phone], at any time, 24 hours a day, 7 days a week. If you are in danger or need immediate medical attention call 911.  Please see education materials related to pain provided  by MyChart link. Care plan and visit instructions communicated with the patient verbally today. Patient agrees to receive a copy in MyChart. Active MyChart status and patient understanding of how to access instructions and care plan via MyChart confirmed with patient.     Social Worker will contact you on 10-22-2024 at 10:00 AM .  Telephone follow up appointment with Managed Medicaid care management team member scheduled for:11-05-2024 at 11:00 with Bobbette Stank, RN    Diana Her RN, BSN   I [facility] RN Case Manager   Direct [phone]   Following is a copy of your plan of care:  There are no care plans that you recently modified to display for this patient.

## 2024-10-21 NOTE — Telephone Encounter (Signed)
-----   Message from Evalene Arts sent at 10/21/2024  4:08 PM EDT ----- Regarding: FW: RX for Scale needed How do we fax an order for a scale? ----- Message ----- From: Kay Hendricks MATSU, RN Sent: 10/21/2024   3:23 PM EDT To: Evalene Arts, FNP Subject: RX for Scale needed                            Hi Evalene,  I conducted a telephonic intake visit with the patient today as part of the Complex Care Management Program.   During the assessment, multiple needs were identified. I have referred the patient to our Licensed Clinical Social Worker (LCSW) to address social determinants of health (SDOH) and psychosocial concerns.  At this time, my only request is for a prescription for a home scale to be faxed to [facility] at [phone].  When that is faxed , I will contact patient to have her pick it up.    Thank you, Hendricks

## 2024-10-23 ENCOUNTER — Encounter

## 2024-10-23 DIAGNOSIS — F3132 Bipolar disorder, current episode depressed, moderate: Secondary | ICD-10-CM | POA: Diagnosis not present

## 2024-10-24 ENCOUNTER — Encounter

## 2024-10-25 ENCOUNTER — Other Ambulatory Visit: Payer: Self-pay | Admitting: Family Medicine

## 2024-10-25 DIAGNOSIS — R1032 Left lower quadrant pain: Secondary | ICD-10-CM

## 2024-10-25 DIAGNOSIS — K5900 Constipation, unspecified: Secondary | ICD-10-CM

## 2024-10-28 ENCOUNTER — Encounter: Payer: Self-pay | Admitting: Physical Therapy

## 2024-10-28 ENCOUNTER — Encounter: Payer: Self-pay | Admitting: Radiology

## 2024-10-28 ENCOUNTER — Ambulatory Visit: Attending: Family Medicine | Admitting: Physical Therapy

## 2024-10-28 DIAGNOSIS — M542 Cervicalgia: Secondary | ICD-10-CM | POA: Insufficient documentation

## 2024-10-28 DIAGNOSIS — M5459 Other low back pain: Secondary | ICD-10-CM | POA: Diagnosis present

## 2024-10-28 DIAGNOSIS — M25551 Pain in right hip: Secondary | ICD-10-CM | POA: Insufficient documentation

## 2024-10-28 DIAGNOSIS — M6281 Muscle weakness (generalized): Secondary | ICD-10-CM | POA: Diagnosis present

## 2024-10-28 DIAGNOSIS — R262 Difficulty in walking, not elsewhere classified: Secondary | ICD-10-CM | POA: Insufficient documentation

## 2024-10-28 DIAGNOSIS — Z9181 History of falling: Secondary | ICD-10-CM | POA: Insufficient documentation
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
## 2024-10-30 ENCOUNTER — Encounter: Payer: Self-pay | Admitting: Physical Therapy

## 2024-10-30 ENCOUNTER — Ambulatory Visit: Admitting: Physical Therapy

## 2024-10-30 DIAGNOSIS — Z9181 History of falling: Secondary | ICD-10-CM

## 2024-10-30 DIAGNOSIS — M25551 Pain in right hip: Secondary | ICD-10-CM

## 2024-10-30 DIAGNOSIS — M5459 Other low back pain: Secondary | ICD-10-CM

## 2024-10-30 DIAGNOSIS — R262 Difficulty in walking, not elsewhere classified: Secondary | ICD-10-CM

## 2024-10-30 DIAGNOSIS — M542 Cervicalgia: Secondary | ICD-10-CM | POA: Diagnosis not present

## 2024-10-30 DIAGNOSIS — M6281 Muscle weakness (generalized): Secondary | ICD-10-CM
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
## 2024-10-31 ENCOUNTER — Encounter

## 2024-11-01 ENCOUNTER — Encounter: Payer: Self-pay | Admitting: Physical Therapy

## 2024-11-04 ENCOUNTER — Encounter

## 2024-11-04 ENCOUNTER — Ambulatory Visit: Admitting: Physical Therapy

## 2024-11-05 ENCOUNTER — Telehealth: Payer: Self-pay

## 2024-11-05 ENCOUNTER — Other Ambulatory Visit: Payer: Self-pay

## 2024-11-05 NOTE — Patient Outreach (Unsigned)
 Complex Care Management   Visit Note  11/05/2024  Name:  Diana Galvan MRN: [id number] DOB: [birth year]  Situation: Referral received for Complex Care Management related to {Criteria:32550} I obtained verbal consent from {CHL AMB Patient/Caregiver:28184}.  Visit completed with {CHL AMB Patient/Caregiver:28184}  {VISIT LOCATION:32553}  Background:   Past Medical History:  Diagnosis Date   Asthma    Coronary artery disease    mild cad in mLAD on coronary CTA   Costochondritis    DDD (degenerative disc disease), lumbar    Depression    H/O blood clots 10/26/2002   History of Erb's palsy    Thyroid disease    Thyroid goiter     Assessment: Patient Reported Symptoms:  Cognitive        Neurological      HEENT        Cardiovascular      Respiratory      Endocrine      Gastrointestinal        Genitourinary      Integumentary      Musculoskeletal          Psychosocial       Quality of Family Relationships: non-existent, stressful Do you feel physically threatened by others?: No    11/05/2024    PHQ2-9 Depression Screening   Little interest or pleasure in doing things    Feeling down, depressed, or hopeless    PHQ-2 - Total Score    Trouble falling or staying asleep, or sleeping too much    Feeling tired or having little energy    Poor appetite or overeating     Feeling bad about yourself - or that you are a failure or have let yourself or your family down    Trouble concentrating on things, such as reading the newspaper or watching television    Moving or speaking so slowly that other people could have noticed.  Or the opposite - being so fidgety or restless that you have been moving around a lot more than usual    Thoughts that you would be better off dead, or hurting yourself in some way    PHQ2-9 Total Score    If you checked off any problems, how difficult have these problems made it for you to do your work, take care of things at home, or  get along with other people    Depression Interventions/Treatment      There were no vitals filed for this visit.    Medications Reviewed Today     Reviewed by Karoline Lima, RN (Registered Nurse) on 11/05/24 at 1240  Med List Status: <None>   Medication Order Taking? Sig Documenting Provider Last Dose Status Informant  albuterol  (VENTOLIN  HFA) 108 (90 Base) MCG/ACT inhaler [id number]  INHALE 2 PUFFS BY MOUTH EVERY 6 HOURS AS NEEDED FOR WHEEZING OR SHORTNESS OF SHERIDA Bernardo Fend, DO  Active   Blood Glucose Monitoring Suppl DEVI [id number]  1 each by Does not apply route in the morning, at noon, and at bedtime. May substitute to any manufacturer covered by patient's insurance.  Patient not taking: Reported on 10/21/2024   Towana Small, FNP  Active   budesonide -formoterol  (SYMBICORT ) 160-4.5 MCG/ACT inhaler [id number]  Inhale 2 puffs into the lungs 2 (two) times daily.  Patient not taking: Reported on 10/21/2024   Tapia, Leisa, PA-C  Active   fluconazole (DIFLUCAN) 150 MG tablet [id number]  Take 1 tablet (150mg ) AFTER you complete the antibiotic (Bactrim).  Patient taking differently: Take  1 tablet (150mg ) AFTER you complete the antibiotic (Bactrim).   Towana Small, FNP  Active   meloxicam  (MOBIC ) 15 MG tablet [id number]  Take 1 tablet (15 mg total) by mouth daily.  Patient not taking: Reported on 10/21/2024   Towana Small, FNP  Active   Misc. Devices (GNP DIGITAL WEIGHT SCALE) MISC [id number]  Patient in need of at home weigh scale to monitor weight fluctuations. Patient requests according to social worker that RX be faxed to medical supply to have this scale covered by her insurance plan. Towana Small, FNP  Active   polyethylene glycol powder (MIRALAX ) 17 GM/SCOOP powder [id number]  Take 17 g by mouth daily. Dissolve 1 capful (17g) in 4-8 ounces of liquid and take by mouth daily.  Patient taking differently: Take 17 g by mouth as needed for mild constipation. Dissolve 1  capful (17g) in 4-8 ounces of liquid and take by mouth daily.   Towana Small, FNP  Active   semaglutide -weight management (WEGOVY ) 1 MG/0.5ML SOAJ SQ injection [id number]  Inject 1 mg into the skin once a week for 28 days.  Patient not taking: Reported on 10/21/2024   Towana Small, FNP  Active   semaglutide -weight management (WEGOVY ) 1.7 MG/0.75ML SOAJ SQ injection [id number]  Inject 1.7 mg into the skin once a week for 28 days.  Patient not taking: Reported on 10/21/2024   Towana Small, FNP  Active   semaglutide -weight management (WEGOVY ) 2.4 MG/0.75ML SOAJ SQ injection [id number]  Inject 2.4 mg into the skin once a week for 28 days.  Patient not taking: Reported on 10/21/2024   Towana Small, FNP  Active             Recommendation:   {RECOMMENDATONS:32554}  Follow Up Plan:   {FOLLOWUP:32559}  SIG ***

## 2024-11-06 ENCOUNTER — Telehealth: Payer: Self-pay

## 2024-11-06 ENCOUNTER — Ambulatory Visit

## 2024-11-06 ENCOUNTER — Encounter: Payer: Self-pay | Admitting: Physical Therapy

## 2024-11-06 DIAGNOSIS — M6281 Muscle weakness (generalized): Secondary | ICD-10-CM

## 2024-11-06 DIAGNOSIS — R262 Difficulty in walking, not elsewhere classified: Secondary | ICD-10-CM

## 2024-11-06 DIAGNOSIS — M25551 Pain in right hip: Secondary | ICD-10-CM

## 2024-11-06 DIAGNOSIS — H0289 Other specified disorders of eyelid: Secondary | ICD-10-CM | POA: Diagnosis not present

## 2024-11-06 DIAGNOSIS — M542 Cervicalgia: Secondary | ICD-10-CM

## 2024-11-06 DIAGNOSIS — M5459 Other low back pain: Secondary | ICD-10-CM

## 2024-11-06 DIAGNOSIS — Z9181 History of falling: Secondary | ICD-10-CM
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
## 2024-11-06 NOTE — Telephone Encounter (Signed)
 Copied from CRM [id number]. Topic: Clinical - Medication Prior Auth >> Nov 06, 2024  2:38 PM Delon T wrote: Reason for CRM: PA was denied for lumbar and cervical spine, need to know if patient is aware so appt can be cancelled [phone]

## 2024-11-07 ENCOUNTER — Encounter

## 2024-11-07 DIAGNOSIS — F3132 Bipolar disorder, current episode depressed, moderate: Secondary | ICD-10-CM | POA: Diagnosis not present
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
## 2024-11-08 ENCOUNTER — Telehealth: Payer: Self-pay | Admitting: Family Medicine

## 2024-11-08 ENCOUNTER — Other Ambulatory Visit: Payer: Self-pay | Admitting: *Deleted

## 2024-11-08 ENCOUNTER — Telehealth: Payer: Self-pay

## 2024-11-08 NOTE — Telephone Encounter (Signed)
 Pls call [employer] and alert them we are aware this was denied as is patient.     Copied from CRM #[id number]. Topic: General - Other >> Nov 08, 2024 10:22 AM Larissa RAMAN wrote: Reason for CRM: Unice with [employer] calling to inform PCP that authorization for lumbar spine and cervical spine orders were denied.  Callback # [id number]

## 2024-11-08 NOTE — Patient Instructions (Signed)
 Visit Information  Diana Galvan was given information about Medicaid Managed Care team care coordination services as a part of their [facility] Community Plan Medicaid benefit.   If you would like to schedule transportation through your [facility], please call the following number at least 2 days in advance of your appointment: [phone]   Rides for urgent appointments can also be made after hours by calling Member Services.  Call the Behavioral Health Crisis Line at [phone], at any time, 24 hours a day, 7 days a week. If you are in danger or need immediate medical attention call 911.   Patient verbalizes understanding of instructions and care plan provided today and agrees to view in MyChart. Active MyChart status and patient understanding of how to access instructions and care plan via MyChart confirmed with patient.     Referral place for VBCI BSW to address financial strain and housing-appointment scheduled for 11/12/24 1pm  [name], LCSW [place]  [facility], [facility] Licensed Clinical Social Worker  Direct Dial: [phone]     Following is a copy of your plan of care:  There are no care plans that you recently modified to display for this patient.
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
## 2024-11-08 NOTE — Telephone Encounter (Signed)
 Reached out to pt to inform her I spoke with Jamie (CMA) in regards to Ms. Wassel's insurance denial for lumbar and spine. I was informed by Warren that [name] would need to reach out to her insurance company to find out why they denied it.

## 2024-11-11 ENCOUNTER — Other Ambulatory Visit

## 2024-11-11 ENCOUNTER — Telehealth: Payer: Self-pay | Admitting: Family Medicine

## 2024-11-11 ENCOUNTER — Telehealth

## 2024-11-11 ENCOUNTER — Encounter: Payer: Self-pay | Admitting: Family Medicine

## 2024-11-11 ENCOUNTER — Ambulatory Visit: Admitting: Physical Therapy

## 2024-11-11 ENCOUNTER — Encounter: Payer: Self-pay | Admitting: Physical Therapy

## 2024-11-11 DIAGNOSIS — R262 Difficulty in walking, not elsewhere classified: Secondary | ICD-10-CM

## 2024-11-11 DIAGNOSIS — M5459 Other low back pain: Secondary | ICD-10-CM

## 2024-11-11 DIAGNOSIS — J45909 Unspecified asthma, uncomplicated: Secondary | ICD-10-CM

## 2024-11-11 DIAGNOSIS — M542 Cervicalgia: Secondary | ICD-10-CM | POA: Diagnosis not present

## 2024-11-11 DIAGNOSIS — M25551 Pain in right hip: Secondary | ICD-10-CM

## 2024-11-11 DIAGNOSIS — Z9181 History of falling: Secondary | ICD-10-CM

## 2024-11-11 DIAGNOSIS — M6281 Muscle weakness (generalized): Secondary | ICD-10-CM

## 2024-11-11 NOTE — Telephone Encounter (Signed)
 Copied from CRM #[id number]. Topic: General - Other >> Nov 11, 2024 12:02 PM Zebedee SAUNDERS wrote: Reason for CRM: Received call from pt and [facility] ph: [phone],  regarding prior authorization that is needed for referral # [id number]. Pt [phone] would like a call back to confirm prior authorization was sent to [facility].
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
## 2024-11-11 NOTE — Telephone Encounter (Signed)
 Called pt to let her know GI referral is authorized and ready to schedule. However pt stated the crm earlier was not supposed to be for GI it was supposed to be in regards to lumbar/spine imaging. I informed pt that she would need to come in for appt for us  to resend for lumbar/spine imaging due to recently denied auth previously. Pt requested call back on 11/12/2024 around 2pm to schedule for this f/u appt.

## 2024-11-12 ENCOUNTER — Other Ambulatory Visit: Payer: Self-pay

## 2024-11-12 ENCOUNTER — Telehealth: Admitting: Licensed Clinical Social Worker

## 2024-11-12 NOTE — Patient Instructions (Signed)
 Visit Information  Diana Galvan was given information about Medicaid Managed Care team care coordination services as a part of their [facility] Plan Medicaid benefit.   If you would like to schedule transportation through your [facility], please call the following number at least 2 days in advance of your appointment: [phone]   Rides for urgent appointments can also be made after hours by calling Member Services.  Call the Behavioral Health Crisis Line at [phone], at any time, 24 hours a day, 7 days a week. If you are in danger or need immediate medical attention call 911.    Patient verbalizes understanding of instructions and care plan provided today and agrees to view in MyChart. Active MyChart status and patient understanding of how to access instructions and care plan via MyChart confirmed with patient.     Social worker will follow up on 11/26/24  [name], BSW, M[facility] [place]  [facility] Social Worker, Population Health [phone]   Following is a copy of your plan of care:  There are no care plans that you recently modified to display for this patient.

## 2024-11-12 NOTE — Patient Outreach (Signed)
 Social Drivers of Health  Community Resource and Care Coordination Visit Note   11/12/2024  Name: Diana Galvan MRN: [id number] DOB:[birth year]  Situation: Referral received for [facility] needs assessment and assistance related to Housing  Financial Strain  DME Rollator. I obtained verbal consent from Patient.  Visit completed with Patient on the phone.   Background:   SDOH Interventions Today    Flowsheet Row Most Recent Value  SDOH Interventions   Financial Strain Interventions Community Resources Provided     Assessment:   Goals Addressed             This Visit's Progress    BSW Goals       Current SDOH Barriers:  Assistance with rent. Needing DME Rollator for work.  Interventions: SW sent resources for rent via email. SW sent PCP a message to inquire about sending a script for a rollator. SW asked about order placed for a scale and backbrace.  [name], BSW, [facility] [place]  [facility] Social Worker, Population Health [phone]           Recommendation:   Utilize resources for rent assistance  Follow Up Plan:   Telephone follow-up 11/26/24 at 1pm  [name], BSW, [facility] [place]  [facility] Social Worker, Population Health [phone]

## 2024-11-13 ENCOUNTER — Ambulatory Visit: Admitting: Physical Therapy

## 2024-11-14 ENCOUNTER — Encounter: Payer: Self-pay | Admitting: Physical Therapy

## 2024-11-15 ENCOUNTER — Telehealth: Payer: Self-pay | Admitting: Physical Therapy

## 2024-11-15 ENCOUNTER — Encounter: Payer: Self-pay | Admitting: Physical Therapy

## 2024-11-15 NOTE — Telephone Encounter (Signed)
 Patient called back from work at [employer]  Patient states she does not feel like she was injured in physical therapy. She was in pain. When she got home it was in the back of her right thigh and sometimes under her right butt cheek. Also still in the front of her thigh. She states she was limping a little bit when she left her [facility] PT visit. After she got home from PT and ate her dinner she was hurting more when she got up and she thought it was just soreness. She took her shower and put on various creams. 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 She called out of work and could not come. She has never called out of that job before. She was unsure if she was sore from the stretches she did. The person who took her call got upset with her and very rude and said she was going to have to have a doctor's excuse from her PT in order to return to her shift on Saturday. Patient clarified she needed an excuse note confirming she went to PT, not a return to work note. She states she went to orientation for [employer] the day after her PT visit, went to bed and slept. She felt a little better the next day she got up. Since then she started the job at [employer]. She states yesterday something else happened. She was working at the [employer]. She bent down to pick up a folder from the floor, but she could not straighten back up. She had to stand 6 minutes and could not move until 6 min was over. She states she had that pain hit again last night. She was trying to go up some stairs at work. She had to stay still for 10 minutes before she could move. She states it feels like something dislocated and she feels it in her hip in the front and back. She went home and  it did the same thing. She states this used to happen many years ago with her back. It happened 3 times yesterday but in her right hip and front/back of thigh. Today she was given  the key to the elevator so she doesn't have to go up steps and she has made it a point not to bend. It hasn't happened yet again today.   I let her know she could pick up an excuse note showing she went to PT on 11/11/2024, but I was unsure if I can email it and do not know how to upload in [place] (but will try to do one of these).  Discussed with patient that it is time to refer her back to her doctor for further options for her right hip, as PT has not provided sufficient relief and she is getting too much pain after PT visits. She is in agreement.   Discussed her neck pain (another part of the referral). Pt and PT agreed to continue POC for cervical spine.   Discussed back brace. Pt understood that PT could not order the brace and have insurance cover it. Pt understood that PT had recommended brace in PT Progress Note from 10/16/2024 and a physician could use that documentation to write a prescription for a back brace.   Ended call with pt needing to go back to work after break was over. She said she would call to update her  PT schedule if needed. She confirmed her next PT appointment on 11/25/2024 at 7pm.   She still needs an excuse note for her PT visit on 11/11/2024.   Camie SAUNDERS. Juli, PT, DPT, Cert. MDT 11/15/24, 12:00 PM  [facility] [address] [place], [place] [address] P: [phone] I F: [phone]

## 2024-11-15 NOTE — Patient Instructions (Signed)
 THERAPY ATTENDANCE NOTE  Patient name: Diana Galvan Patient was seen in our office for PT on 11/11/2024 at [employer] [facility]. Juli, PT, DPT, Cert. MDT 11/15/24, 12:19 PM  [facility] [address] [place], [place] [address] P: [phone] I F: [phone]

## 2024-11-15 NOTE — Telephone Encounter (Addendum)
  Called patient to discuss recent messages about her response to PT. No answer. Left VM letting her know the purpose of my call and that I would be back in the office on Monday and could discuss further with her then.    Camie SAUNDERS. Juli, PT, DPT, Cert. MDT 11/15/24, 11:10 AM  [facility] [address] [place], [place] [address] P: [phone] I F: [phone]

## 2024-11-18 NOTE — Progress Notes (Deleted)
 Referring Physician:  Towana Small, FNP [address] [place],  [place] [address]  Primary Physician:  Towana Small, FNP  History of Present Illness: 11/18/2024*** Diana Galvan has a history of bipolar, erb's palsy as newborn, asthma, history of drug use (cocaine, crack, speed), syphilis, CAD, depression, thyroid disease, and history of blood clots.   Neck pain Numbness and tingling in fingers of both hands Has Erb's Palsy of left arm-has decreased mobility with that arm and weakness. Arm pain?  Duration: *** Location: *** Quality: *** Severity: ***  Precipitating: aggravated by *** Modifying factors: made better by *** Weakness: none Timing: ***  Tobacco use: smokes 1/4 PPD x *** years.   Bowel/Bladder Dysfunction: none  Conservative measures:  Physical therapy:  has participated with [facility] 08/06/24-to present Multimodal medical therapy including regular antiinflammatories: *** Voltaren  gel, Meloxicam  Injections: *** 04/25/2022 SI joint injection  Past Surgery: ***no spine surgery  Diana Galvan has ***no symptoms of cervical myelopathy.  The symptoms are causing a significant impact on the patient's life.   Review of Systems:  A 10 point review of systems is negative, except for the pertinent positives and negatives detailed in the HPI.  Past Medical History: Past Medical History:  Diagnosis Date   Asthma    Coronary artery disease    mild cad in mLAD on coronary CTA   Costochondritis    DDD (degenerative disc disease), lumbar    Depression    H/O blood clots 10/26/2002   History of Erb's palsy    Thyroid disease    Thyroid goiter     Past Surgical History: Past Surgical History:  Procedure Laterality Date   APPENDECTOMY     COLONOSCOPY WITH PROPOFOL  N/A 02/03/2020   Procedure: COLONOSCOPY WITH PROPOFOL ;  Surgeon: Jinny Carmine, MD;  Location: [facility] ENDOSCOPY;  Service: Endoscopy;  Laterality: N/A;  Priority 4   THYROIDECTOMY     TUBAL  LIGATION     VAGINAL HYSTERECTOMY  10/30/2002    Allergies: Allergies as of 11/27/2024 - Review Complete 11/11/2024  Allergen Reaction Noted   Oxycodone-acetaminophen  Other (See Comments) 05/04/2018   Adhesive [tape] Other (See Comments) 09/11/2019   Wool alcohol [lanolin] Itching 09/11/2019    Medications: Outpatient Encounter Medications as of 11/27/2024  Medication Sig   albuterol  (VENTOLIN  HFA) 108 (90 Base) MCG/ACT inhaler INHALE 2 PUFFS BY MOUTH EVERY 6 HOURS AS NEEDED FOR WHEEZING OR SHORTNESS OF BREATH   Blood Glucose Monitoring Suppl DEVI 1 each by Does not apply route in the morning, at noon, and at bedtime. May substitute to any manufacturer covered by patient's insurance. (Patient not taking: Reported on 11/08/2024)   budesonide -formoterol  (SYMBICORT ) 160-4.5 MCG/ACT inhaler Inhale 2 puffs into the lungs 2 (two) times daily. (Patient not taking: Reported on 11/08/2024)   fluconazole  (DIFLUCAN ) 150 MG tablet Take 1 tablet (150mg ) AFTER you complete the antibiotic (Bactrim ). (Patient not taking: Reported on 11/08/2024)   meloxicam  (MOBIC ) 15 MG tablet Take 1 tablet (15 mg total) by mouth daily. (Patient not taking: Reported on 11/08/2024)   Misc. Devices (GNP DIGITAL WEIGHT SCALE) MISC Patient in need of at home weigh scale to monitor weight fluctuations. Patient requests according to social worker that RX be faxed to medical supply to have this scale covered by her insurance plan. (Patient not taking: Reported on 11/08/2024)   polyethylene glycol powder (MIRALAX ) 17 GM/SCOOP powder Take 17 g by mouth daily. Dissolve 1 capful (17g) in 4-8 ounces of liquid and take by mouth daily. (Patient  taking differently: Take 17 g by mouth as needed for mild constipation. Dissolve 1 capful (17g) in 4-8 ounces of liquid and take by mouth daily.)   semaglutide -weight management (WEGOVY ) 1 MG/0.5ML SOAJ SQ injection Inject 1 mg into the skin once a week for 28 days. (Patient not taking: Reported on  11/08/2024)   [START ON 11/22/2024] semaglutide -weight management (WEGOVY ) 1.7 MG/0.75ML SOAJ SQ injection Inject 1.7 mg into the skin once a week for 28 days. (Patient not taking: Reported on 11/08/2024)   [START ON 12/21/2024] semaglutide -weight management (WEGOVY ) 2.4 MG/0.75ML SOAJ SQ injection Inject 2.4 mg into the skin once a week for 28 days. (Patient not taking: Reported on 11/08/2024)   No facility-administered encounter medications on file as of 11/27/2024.    Social History: Social History   Tobacco Use   Smoking status: Some Days    Current packs/day: 0.25    Average packs/day: 0.3 packs/day for 38.0 years (9.5 ttl pk-yrs)    Types: Cigarettes    Passive exposure: Never   Smokeless tobacco: Never  Vaping Use   Vaping status: Former   Substances: Nicotine  Substance Use Topics   Alcohol use: Not Currently   Drug use: Not Currently    Types: Crack cocaine    Comment: Recovered 2001    Family Medical History: Family History  Problem Relation Age of Onset   Hypertension Mother    Hyperlipidemia Mother    Mental illness Mother    Heart Problems Mother    Asthma Father    Heart disease Father    Congestive Heart Failure Paternal Aunt    Heart failure Paternal Aunt    Heart failure Paternal Grandmother    Breast cancer Cousin     Physical Examination: There were no vitals filed for this visit.  General: Patient is well developed, well nourished, calm, collected, and in no apparent distress. Attention to examination is appropriate.  Respiratory: Patient is breathing without any difficulty.   NEUROLOGICAL:     Awake, alert, oriented to person, place, and time.  Speech is clear and fluent. Fund of knowledge is appropriate.   Cranial Nerves: Pupils equal round and reactive to light.  Facial tone is symmetric.    *** ROM of cervical spine *** pain *** posterior cervical tenderness. *** tenderness in bilateral trapezial region.   *** ROM of lumbar spine ***  pain *** posterior lumbar tenderness.   No abnormal lesions on exposed skin.   Strength: Side Biceps Triceps Deltoid Interossei Grip Wrist Ext. Wrist Flex.  R 5 5 5 5 5 5 5   L 5 5 5 5 5 5 5    Side Iliopsoas Quads Hamstring PF DF EHL  R 5 5 5 5 5 5   L 5 5 5 5 5 5    Reflexes are ***2+ and symmetric at the biceps, brachioradialis, patella and achilles.   Hoffman's is absent.  Clonus is not present.   Bilateral upper and lower extremity sensation is intact to light touch.     Gait is normal.   ***No difficulty with tandem gait.    Medical Decision Making  Imaging: Cervical xrays dated 08/14/24:  FINDINGS: Chronic straightening of normal lordosis. No listhesis. Mild disc space narrowing and spurring at C5-C6. The remaining disc spaces are preserved. No evidence of fracture or focal bone abnormality. No high-grade bony neural foraminal stenosis. No prevertebral soft tissue thickening.   IMPRESSION: Mild degenerative disc disease at C5-C6.     Electronically Signed   By: Andrea  Sanford M.D.   On: 08/19/2024 15:13   Lumbar xrays dated 08/14/24:  FINDINGS: Five non-rib-bearing lumbar vertebra. Trace anterolisthesis of L4 on L5. Mild disc space narrowing and anterior spurring at L4-L5 and L5-S1. Moderate L3-L4, L4-L5, and L5-S1 facet hypertrophy. No evidence of fracture or focal bone abnormality. No visible pars defects. Surgical clips in the pelvis.   IMPRESSION: 1. Mild degenerative disc disease at L4-L5 and L5-S1. 2. Moderate L3-L4, L4-L5, and L5-S1 facet hypertrophy.     Electronically Signed   By: Andrea Gasman M.D.   On: 08/19/2024 15:15        I have personally reviewed the images and agree with the above interpretation.  Assessment and Plan: Diana Galvan is a pleasant 58 y.o. female has ***  Treatment options discussed with patient and following plan made:   - Order for physical therapy for *** spine ***. Patient to call to schedule appointment.  *** - Continue current medications including ***. Reviewed dosing and side effects.  - Prescription for ***. Reviewed dosing and side effects. Take with food.  - Prescription for *** to take prn muscle spasms. Reviewed dosing and side effects. Discussed this can cause drowsiness.  - MRI of *** to further evaluate *** radiculopathy. No improvement time or medications (***).  - Referral to PMR at [facility] to discuss possible *** injections.  - Will schedule phone visit to review MRI results once I get them back.   I spent a total of *** minutes in face-to-face and non-face-to-face activities related to this patient's care today including review of outside records, review of imaging, review of symptoms, physical exam, discussion of differential diagnosis, discussion of treatment options, and documentation.   Thank you for involving me in the care of this patient.   Glade Boys PA-C Dept. of Neurosurgery

## 2024-11-25 ENCOUNTER — Ambulatory Visit: Admitting: Physical Therapy

## 2024-11-25 ENCOUNTER — Telehealth (HOSPITAL_BASED_OUTPATIENT_CLINIC_OR_DEPARTMENT_OTHER): Payer: Self-pay | Admitting: Orthopaedic Surgery

## 2024-11-25 NOTE — Telephone Encounter (Signed)
 Patient states that her rt thigh is giving her pain and she gets stuck from 10-15 mins at a time and she is not sure why her leg is doing this. I sch her with Leonce on the day that she could come in. [phone]

## 2024-11-26 ENCOUNTER — Other Ambulatory Visit: Payer: Self-pay

## 2024-11-26 NOTE — Patient Outreach (Signed)
 Social Drivers of Health  Community Resource and Care Coordination Visit Note   11/26/2024  Name: Morgane Joerger MRN: [id number] DOB:Jan 06, 1966  Situation: Referral received for [facility] needs assessment and assistance related to Housing  DME. I obtained verbal consent from Patient.  Visit completed with Patient on the phone.   Background:      Assessment:   Goals Addressed             This Visit's Progress    BSW Goals       Current SDOH Barriers:  Assistance with rent. Needing DME Rollator for work.  Interventions: SW sent resources for rent via email. SW sent PCP a message to inquire about sending a script for a rollator. SW asked about order placed for a scale and backbrace. SW sent another message to patients PCP.  [name], HEDWIG, MHA [place]  [facility] Social Worker, Population Health [phone]           Recommendation:   follow up with resources regarding housing needs  Follow Up Plan:   Telephone follow-up 12/12/24 at 11am [name], BSW, [facility] [place]  [facility] Social Worker, Population Health [phone]

## 2024-11-26 NOTE — Patient Instructions (Signed)
 Visit Information  Diana Galvan was given information about Medicaid Managed Care team care coordination services as a part of their [facility] Medicaid benefit.   If you would like to schedule transportation through your [facility], please call the following number at least 2 days in advance of your appointment: [phone]   Rides for urgent appointments can also be made after hours by calling Member Services.  Call the Behavioral Health Crisis Line at [phone], at any time, 24 hours a day, 7 days a week. If you are in danger or need immediate medical attention call 911.    Social Worker will follow up on 12/12/24 at 11am  [name], BSW, [facility] [place]  [facility] Social Worker, Population Health [phone]   Following is a copy of your plan of care:  There are no care plans that you recently modified to display for this patient.

## 2024-11-27 ENCOUNTER — Telehealth: Payer: Self-pay | Admitting: Physical Therapy

## 2024-11-27 ENCOUNTER — Ambulatory Visit: Attending: Family Medicine | Admitting: Physical Therapy

## 2024-11-27 ENCOUNTER — Ambulatory Visit: Admitting: Orthopedic Surgery

## 2024-11-27 NOTE — Telephone Encounter (Signed)
 LVM notifying patient of missed PT visit scheduled at 7pm today. Requested they call back to reschedule, confirm next appointment, or let us  know of any changes in PT plans. Let patient know that with any no-show I am required to review our cancellation policy that after 2 no-shows we remove future visits from the schedule, they are responsible to calling in to reschedule, and they will only be able to schedule one appointment at a time and/or we may remove a patient from the schedule.   Camie SAUNDERS. Juli, PT, DPT 11/27/24, 7:15 PM  [facility] Physical [facility] Rehab [address] [place], [place] [address] P: [phone] I F: [phone]

## 2024-11-28 ENCOUNTER — Encounter: Admitting: Dietician

## 2024-11-28 ENCOUNTER — Other Ambulatory Visit: Payer: Self-pay

## 2024-11-29 NOTE — Patient Outreach (Signed)
 Complex Care Management   Visit Note    Name:  Diana Galvan MRN: [id number] DOB: 04-14-1966  Situation: Referral received for Complex Care Management related to Pain Management I obtained verbal consent from Patient.  Visit completed with Diana Galvan via telephone.  Background:   Past Medical History:  Diagnosis Date   Asthma    Coronary artery disease    mild cad in mLAD on coronary CTA   Costochondritis    DDD (degenerative disc disease), lumbar    Depression    H/O blood clots 10/26/2002   History of Erb's palsy    Thyroid disease    Thyroid goiter     Assessment: Patient Reported Symptoms:  Cognitive Cognitive Status: Alert and oriented to person, place, and time, Normal speech and language skills Cognitive/Intellectual Conditions Management [RPT]: None reported or documented in medical history or problem list Health Maintenance Behaviors: Annual physical exam, Sleep adequate, Stress management, Spiritual practice(s) Healing Pattern: Average Health Facilitated by: Prayer/meditation, Rest, Stress management  Neurological Neurological Review of Symptoms: Other:, Vision changes Oher Neurological Symptoms/Conditions [RPT]: Was evaluated by the [facility] team. Denies acute changes today Neurological Management Strategies: Routine screening, Coping strategies, Medical device Neurological Self-Management Outcome: 4 (good)  HEENT HEENT Symptoms Reported: No symptoms reported HEENT Management Strategies: Routine screening HEENT Self-Management Outcome: 4 (good)  Cardiovascular Cardiovascular Symptoms Reported: No symptoms reported Does patient have uncontrolled Hypertension?: No Cardiovascular Management Strategies: Adequate rest, Coping strategies, Routine screening Cardiovascular Self-Management Outcome: 4 (good)  Respiratory Respiratory Symptoms Reported: No symptoms reported Respiratory Management Strategies: Coping strategies, Routine screening Respiratory  Self-Management Outcome: 4 (good)  Endocrine Endocrine Symptoms Reported: No symptoms reported Is patient diabetic?: No  Gastrointestinal Gastrointestinal Symptoms Reported: Other Other Gastrointestinal Symptoms: Reports episodes of constipation. Using miralax  as needed to manage. Gastrointestinal Management Strategies: Diet modification, Medication therapy, Coping strategies Gastrointestinal Self-Management Outcome: 4 (good)  Genitourinary Genitourinary Symptoms Reported: Incontinence Additional Genitourinary Details: Reports episodes of incontinence. Using incontinence garments/pads as needed Genitourinary Management Strategies: Coping strategies, Incontinence garment/pad Genitourinary Self-Management Outcome: 4 (good)  Integumentary Integumentary Symptoms Reported: No symptoms reported Skin Management Strategies: Routine screening Skin Self-Management Outcome: 4 (good)  Musculoskeletal Musculoskelatal Symptoms Reviewed: Back pain, Unsteady gait, Weakness Additional Musculoskeletal Details: Reports completing Physical Therapy at [facility]. She will follow up with the Orthopedic team on December 04, 2024 Musculoskeletal Management Strategies: Coping strategies, Adequate rest, Routine screening Musculoskeletal Self-Management Outcome: 4 (good) Falls in the past year?: Yes Number of falls in past year: 2 or more Was there an injury with Fall?: Yes Fall Risk Category Calculator: 3 Patient Fall Risk Level: High Fall Risk Patient at Risk for Falls Due to: History of fall(s), Impaired balance/gait, Impaired mobility Fall risk Follow up: Falls prevention discussed  Psychosocial Psychosocial Symptoms Reported: Depression - if selected complete PHQ 2-9 Additional Psychological Details: Reports currently engaged with the [facility] team for mental health services. Reports consistently completing visits every Monday with exception of November 25, 2024 due to her work schedule. Reports her  counselor Diana Galvan is making arrangements for additional visit. Reports records from her previous provider [facility] [place] have been released. She anticipates symptoms improving once she resumes her previous regimen which included celexa. Reports she has been able to manage well at her new position at [employer] Management Strategies: Adequate rest, Coping strategies, [employer] [facility] Self-Management Outcome: 4 (good) Major Change/Loss/Stressor/Fears (CP): Medical condition, self, Traumatic event Behaviors When Feeling Stressed/Fearful: Withdrawal Techniques to Cope with  Loss/Stress/Change: Spiritual practice(s) Quality of Family Relationships: non-existent Do you feel physically threatened by others?: No    11/29/2024    PHQ2-9 Depression Screening   Little interest or pleasure in doing things Several days  Feeling down, depressed, or hopeless Several days  PHQ-2 - Total Score 2  Trouble falling or staying asleep, or sleeping too much Several days  Feeling tired or having little energy Nearly every day  Poor appetite or overeating  Several days  Feeling bad about yourself - or that you are a failure or have let yourself or your family down More than half the days  Trouble concentrating on things, such as reading the newspaper or watching television Nearly every day  Moving or speaking so slowly that other people could have noticed.  Or the opposite - being so fidgety or restless that you have been moving around a lot more than usual Not at all (Reports changes in movement are due to back and hip pain.)  Thoughts that you would be better off dead, or hurting yourself in some way Not at all  PHQ2-9 Total Score 12    There were no vitals filed for this visit. Pain Scale: 0-10 Pain Score:  (Denies issues today. Reports episodes of chronic pain especially after her work shift.)  Medications Reviewed Today     Reviewed by Karoline Lima, RN  (Registered Nurse) on 11/28/24 at [id number]  Med List Status: <None>   Medication Order Taking? Sig Documenting Provider Last Dose Status Informant  albuterol  (VENTOLIN  HFA) 108 (90 Base) MCG/ACT inhaler [id number]  INHALE 2 PUFFS BY MOUTH EVERY 6 HOURS AS NEEDED FOR WHEEZING OR SHORTNESS OF SHERIDA Bernardo Fend, DO  Active   Blood Glucose Monitoring Suppl DEVI [id number]  1 each by Does not apply route in the morning, at noon, and at bedtime. May substitute to any manufacturer covered by patient's insurance.  Patient not taking: Reported on 11/08/2024   Towana Small, FNP  Active   budesonide -formoterol  (SYMBICORT ) 160-4.5 MCG/ACT inhaler [id number]  Inhale 2 puffs into the lungs 2 (two) times daily.  Patient not taking: Reported on 11/08/2024   Tapia, Leisa, PA-C  Active   fluconazole  (DIFLUCAN ) 150 MG tablet [id number]  Take 1 tablet (150mg ) AFTER you complete the antibiotic (Bactrim ).  Patient not taking: Reported on 11/08/2024   Towana Small, FNP  Active   meloxicam  (MOBIC ) 15 MG tablet [id number]  Take 1 tablet (15 mg total) by mouth daily.  Patient not taking: Reported on 11/08/2024   Towana Small, FNP  Active   Misc. Devices (GNP DIGITAL WEIGHT SCALE) MISC [id number]  Patient in need of at home weigh scale to monitor weight fluctuations. Patient requests according to social worker that RX be faxed to medical supply to have this scale covered by her insurance plan.  Patient not taking: Reported on 11/08/2024   Towana Small, FNP  Active   polyethylene glycol powder (MIRALAX ) 17 GM/SCOOP powder [id number]  Take 17 g by mouth daily. Dissolve 1 capful (17g) in 4-8 ounces of liquid and take by mouth daily.  Patient taking differently: Take 17 g by mouth as needed for mild constipation. Dissolve 1 capful (17g) in 4-8 ounces of liquid and take by mouth daily.   Towana Small, FNP  Active   semaglutide -weight management (WEGOVY ) 1.7 MG/0.75ML SOAJ SQ injection [id number]  Inject 1.7 mg  into the skin once a week for 28 days.  Patient not taking: Reported on 11/08/2024   Towana Small, FNP  Active  semaglutide -weight management (WEGOVY ) 2.4 MG/0.75ML SOAJ SQ injection [id number]  Inject 2.4 mg into the skin once a week for 28 days.  Patient not taking: Reported on 11/08/2024   Towana Small, FNP  Active             Recommendation:   Continue Current Plan of Care  Follow Up Plan:   Telephone follow up appointment with Nurse Case Manager on December 24, 2024   Jackson Acron [facility] RN Care Manager Direct Dial: [phone]  Fax: [phone] Website: [email or web address]
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
## 2024-12-02 ENCOUNTER — Ambulatory Visit: Admitting: Physical Therapy

## 2024-12-04 ENCOUNTER — Ambulatory Visit (INDEPENDENT_AMBULATORY_CARE_PROVIDER_SITE_OTHER): Admitting: Student

## 2024-12-04 ENCOUNTER — Ambulatory Visit: Admitting: Family Medicine

## 2024-12-04 ENCOUNTER — Encounter: Payer: Self-pay | Admitting: Family Medicine

## 2024-12-04 DIAGNOSIS — M160 Bilateral primary osteoarthritis of hip: Secondary | ICD-10-CM | POA: Diagnosis not present

## 2024-12-04 DIAGNOSIS — F3112 Bipolar disorder, current episode manic without psychotic features, moderate: Secondary | ICD-10-CM

## 2024-12-04 DIAGNOSIS — M25551 Pain in right hip: Secondary | ICD-10-CM | POA: Diagnosis not present

## 2024-12-04 DIAGNOSIS — M5441 Lumbago with sciatica, right side: Secondary | ICD-10-CM | POA: Diagnosis not present

## 2024-12-04 DIAGNOSIS — F39 Unspecified mood [affective] disorder: Secondary | ICD-10-CM | POA: Diagnosis not present

## 2024-12-04 DIAGNOSIS — R200 Anesthesia of skin: Secondary | ICD-10-CM | POA: Diagnosis not present

## 2024-12-04 DIAGNOSIS — Z7189 Other specified counseling: Secondary | ICD-10-CM | POA: Diagnosis not present

## 2024-12-04 DIAGNOSIS — M1611 Unilateral primary osteoarthritis, right hip: Secondary | ICD-10-CM

## 2024-12-04 DIAGNOSIS — M461 Sacroiliitis, not elsewhere classified: Secondary | ICD-10-CM | POA: Diagnosis not present

## 2024-12-04 DIAGNOSIS — G8929 Other chronic pain: Secondary | ICD-10-CM | POA: Diagnosis not present

## 2024-12-04 MED ORDER — CITALOPRAM HYDROBROMIDE 10 MG PO TABS: 10.0000 mg | ORAL_TABLET | Freq: Every day | ORAL | 3 refills | Status: DC

## 2024-12-04 MED ORDER — DICLOFENAC SODIUM 75 MG PO TBEC: 75.0000 mg | DELAYED_RELEASE_TABLET | Freq: Two times a day (BID) | ORAL | 0 refills | Status: AC

## 2024-12-04 NOTE — Patient Instructions (Signed)
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
## 2024-12-04 NOTE — Progress Notes (Signed)
 Chief Complaint: Right hip and leg pain    Discussed the use of AI scribe software for clinical note transcription with the patient, who gave verbal consent to proceed.  History of Present Illness Diana Galvan' is a 58 year old female with degenerative spine disease and hip arthritis who presents with severe pain and mobility issues.  She reports severe right-sided pain and loss of mobility after multiple falls a few years ago onto her right side, with the first causing bruised ribs and chest wall pain and the second when she stepped into a hole.  Her primary complaint is excruciating right hip pain, described as feeling like she is sitting on a bone or knot with a sensation that the leg is dangling. Pain is mainly in the front of the hip with radiation down the leg. Prolonged standing at her job at [employer] markedly worsens symptoms, and she has difficulty sitting during breaks and rising from a seated position. At times the pain locks her in place for 10-15 minutes.  She has cervical and lumbar degenerative spine disease with chronic low back and upper buttock pain. Physical therapy previously gave short-term relief, but symptoms recurred after interruptions in therapy.  For pain she uses diclofenac  gel and Aspercreme with some benefit. Meloxicam  caused significant lower-extremity swelling and worsening urinary problems, so she stopped it.   Surgical History:   None  PMH/PSH/Family History/Social History/Meds/Allergies:    Past Medical History:  Diagnosis Date   Asthma    Coronary artery disease    mild cad in mLAD on coronary CTA   Costochondritis    DDD (degenerative disc disease), lumbar    Depression    H/O blood clots 10/26/2002   History of Erb's palsy    Thyroid disease    Thyroid goiter    Past Surgical History:  Procedure Laterality Date   APPENDECTOMY     COLONOSCOPY WITH PROPOFOL  N/A 02/03/2020   Procedure:  COLONOSCOPY WITH PROPOFOL ;  Surgeon: Jinny Carmine, MD;  Location: [facility] ENDOSCOPY;  Service: Endoscopy;  Laterality: N/A;  Priority 4   THYROIDECTOMY     TUBAL LIGATION     VAGINAL HYSTERECTOMY  10/30/2002   Social History   Socioeconomic History   Marital status: Divorced    Spouse name: Not on file   Number of children: 0   Years of education: Not on file   Highest education level: GED or equivalent  Occupational History   Not on file  Tobacco Use   Smoking status: Some Days    Current packs/day: 0.25    Average packs/day: 0.3 packs/day for 38.0 years (9.5 ttl pk-yrs)    Types: Cigarettes    Passive exposure: Never   Smokeless tobacco: Never  Vaping Use   Vaping status: Former   Substances: Nicotine  Substance and Sexual Activity   Alcohol use: Not Currently   Drug use: Not Currently    Types: Crack cocaine    Comment: Recovered 2001   Sexual activity: Yes    Partners: Male    Birth control/protection: None  Other Topics Concern   Not on file  Social History Narrative   Not on file   Social Drivers of Health   Financial Resource Strain: High Risk (11/28/2024)   Overall Financial Resource Strain (CARDIA)    Difficulty  of Paying Living Expenses: Very hard  Food Insecurity: Food Insecurity Present (11/28/2024)   Hunger Vital Sign    Worried About Running Out of Food in the Last Year: Sometimes true    Ran Out of Food in the Last Year: Sometimes true  Transportation Needs: No Transportation Needs (11/08/2024)   PRAPARE - Administrator, Civil Service (Medical): No    Lack of Transportation (Non-Medical): No  Physical Activity: Inactive (11/28/2024)   Exercise Vital Sign    Days of Exercise per Week: 0 days    Minutes of Exercise per Session: Not on file  Stress: Stress Concern Present (11/28/2024)   [employer] of Occupational Health - Occupational Stress Questionnaire    Feeling of Stress: Rather much  Social Connections: Socially Isolated  (10/14/2024)   Social Connection and Isolation Panel    Frequency of Communication with Friends and Family: Twice a week    Frequency of Social Gatherings with Friends and Family: Never    Attends Religious Services: Never    Database Administrator or Organizations: No    Attends Engineer, Structural: Not on file    Marital Status: Divorced   Family History  Problem Relation Age of Onset   Hypertension Mother    Hyperlipidemia Mother    Mental illness Mother    Heart Problems Mother    Asthma Father    Heart disease Father    Congestive Heart Failure Paternal Aunt    Heart failure Paternal Aunt    Heart failure Paternal Grandmother    Breast cancer Cousin    Allergies  Allergen Reactions   Oxycodone-Acetaminophen  Other (See Comments)   Adhesive [Tape] Other (See Comments)    Blisters and burning   Wool Alcohol [Lanolin] Itching   Current Outpatient Medications  Medication Sig Dispense Refill   ACCU-CHEK GUIDE TEST test strip USE TO CHECK BLOOD SUGAR IN THE MORNING, AT NOON, AND AT BEDTIME     albuterol  (VENTOLIN  HFA) 108 (90 Base) MCG/ACT inhaler INHALE 2 PUFFS BY MOUTH EVERY 6 HOURS AS NEEDED FOR WHEEZING OR SHORTNESS OF BREATH 9 g 0   Blood Glucose Monitoring Suppl DEVI 1 each by Does not apply route in the morning, at noon, and at bedtime. May substitute to any manufacturer covered by patient's insurance. (Patient not taking: Reported on 11/08/2024) 1 each 0   budesonide -formoterol  (SYMBICORT ) 160-4.5 MCG/ACT inhaler Inhale 2 puffs into the lungs 2 (two) times daily. (Patient not taking: Reported on 11/08/2024) 1 each 3   citalopram (CELEXA) 10 MG tablet Take 1 tablet (10 mg total) by mouth daily. 30 tablet 3   No current facility-administered medications for this visit.   No results found.  Review of Systems:   A ROS was performed including pertinent positives and negatives as documented in the HPI.  Physical Exam :   Constitutional: NAD and appears stated  age Neurological: Alert and oriented Psych: Appropriate affect and cooperative There were no vitals taken for this visit.   Comprehensive Musculoskeletal Exam:    Tenderness to palpation over the right lumbar midline, right SI joint, right gluteal musculature, and greater trochanter.  Passive right hip range of motion to 120 degrees flexion, 20 degrees external rotation, and 10 degrees internal rotation with discomfort.  Bilateral knee flexion/extension ankle dorsiflexion/plantarflexion strength is 5/5.  Negative right straight leg raise.  Distal neurosensory exam intact.  Imaging:   Xray review from 08/14/2024 (AP pelvis, lumbar spine 4 views): Severe right hip osteoarthritis  with joint space narrowing and subchondral cyst formation.  Notable L3-S1 facet hypertrophy.  Otherwise mild degenerative changes with L4-L5 and L5-S1 to space narrowing.   I personally reviewed and interpreted the radiographs.      Assessment & Plan Severe osteoarthritis of the right hip   Severe osteoarthritis causes significant pain and functional limitations. Previous injections were ineffective, and hip replacement has been recommended although patient is trying to hold off on this. Aquatic therapy is discussed to manage symptoms and maintain activity. She is referred to aquatic therapy for pain management and functional improvement. Future hip replacement is discussed if symptoms worsen. A work accommodation note is provided for sitting during shifts.  Will prescribe oral diclofenac  as she has tolerated this well in the past and recently experienced adverse effects with meloxicam .  Chronic low back pain Patient does present with pain also in the right lower lumbar and SI region.  Suspect that there is an element of sciatica given distribution of pain in the posterior hip radiating down into the thigh.  Discussed nerve patterns with patient using a dermatome map.  Patient will also benefit from diclofenac  as well as  aquatic therapy for her low back.  No red flag symptoms or lower extremity weakness is noted.   I personally spent a total of 50 minutes in the care of the patient today including performing a medically appropriate exam/evaluation, counseling and educating, documenting clinical information in the EHR, and independently interpreting results.   I personally saw and evaluated the patient, and participated in the management and treatment plan.  Leonce Reveal, PA-C Orthopedics

## 2024-12-05 ENCOUNTER — Encounter (HOSPITAL_BASED_OUTPATIENT_CLINIC_OR_DEPARTMENT_OTHER): Payer: Self-pay

## 2024-12-06 ENCOUNTER — Other Ambulatory Visit: Payer: Self-pay

## 2024-12-06 DIAGNOSIS — M549 Dorsalgia, unspecified: Secondary | ICD-10-CM

## 2024-12-06 DIAGNOSIS — M25551 Pain in right hip: Secondary | ICD-10-CM

## 2024-12-09 DIAGNOSIS — F3132 Bipolar disorder, current episode depressed, moderate: Secondary | ICD-10-CM | POA: Diagnosis not present

## 2024-12-10 DIAGNOSIS — R262 Difficulty in walking, not elsewhere classified: Secondary | ICD-10-CM | POA: Diagnosis not present

## 2024-12-10 DIAGNOSIS — M545 Low back pain, unspecified: Secondary | ICD-10-CM | POA: Diagnosis not present

## 2024-12-10 DIAGNOSIS — Z9181 History of falling: Secondary | ICD-10-CM | POA: Diagnosis not present

## 2024-12-11 ENCOUNTER — Other Ambulatory Visit: Payer: Self-pay

## 2024-12-11 DIAGNOSIS — M1611 Unilateral primary osteoarthritis, right hip: Secondary | ICD-10-CM

## 2024-12-11 NOTE — Telephone Encounter (Signed)
 Referral placed.

## 2024-12-12 ENCOUNTER — Ambulatory Visit: Admitting: Gastroenterology

## 2024-12-12 ENCOUNTER — Telehealth

## 2024-12-12 NOTE — Patient Instructions (Signed)
 Visit Information  Ms. Snodgrass was given information about Medicaid Managed Care team care coordination services as a part of their [facility] Community Plan Medicaid benefit.   If you would like to schedule transportation through your [facility], please call the following number at least 2 days in advance of your appointment: [phone]   Rides for urgent appointments can also be made after hours by calling Member Services.  Call the Behavioral Health Crisis Line at [phone], at any time, 24 hours a day, 7 days a week. If you are in danger or need immediate medical attention call 911.   Social Worker will follow up on 12/24/24 at 10:30  [name], BSW, [facility]  [facility] Social Worker, Population Health [phone]   Following is a copy of your plan of care:   Goals Addressed             This Visit's Progress    BSW Goals       Current SDOH Barriers:  Assistance with rent. Needing DME Rollator for work (Rollator has been received).  Interventions: SW sent resources for rent via email. SW sent PCP a message to inquire about sending a script for a rollator. SW asked about order placed for a scale and backbrace. SW sent another message to patients PCP. SW resend resources for rent via mail.  [name], HEDWIG, MHA [place]  [facility] Social Worker, Population Health [phone]

## 2024-12-12 NOTE — Patient Outreach (Signed)
 Social Drivers of Health  Community Resource and Care Coordination Visit Note   12/12/2024  Name: Diana Galvan MRN: [id number] DOB:[birth year]  Situation: Referral received for [facility] needs assessment and assistance related to Housing  rent resources. I obtained verbal consent from Patient.  Visit completed with Patient on the phone.   Background:      Assessment:   Goals Addressed             This Visit's Progress    BSW Goals       Current SDOH Barriers:  Assistance with rent. Needing DME Rollator for work (Rollator has been received).  Interventions: SW sent resources for rent via email. SW sent PCP a message to inquire about sending a script for a rollator. SW asked about order placed for a scale and backbrace. SW sent another message to patients PCP. SW resend resources for rent via mail.  [name], BSW, MHA [place]  [facility] Social Worker, Population Health [phone]           Recommendation:   Use resources to assist with rent.  Follow Up Plan:   Telephone follow-up 12/24/24 at 10:30am  [name], BSW, [facility]  Value Based [facility] Institute Social Worker, Population Health [phone]

## 2024-12-16 ENCOUNTER — Ambulatory Visit

## 2024-12-16 VITALS — BP 111/79 | HR 86 | Ht 67.0 in | Wt 255.8 lb

## 2024-12-16 DIAGNOSIS — M1611 Unilateral primary osteoarthritis, right hip: Secondary | ICD-10-CM | POA: Diagnosis not present

## 2024-12-16 MED ORDER — DICLOFENAC POTASSIUM 50 MG PO TABS: 50.0000 mg | ORAL_TABLET | Freq: Three times a day (TID) | ORAL | 3 refills | Status: DC
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
## 2024-12-18 ENCOUNTER — Telehealth: Payer: Self-pay

## 2024-12-18 NOTE — Progress Notes (Signed)
 Complex Care Management Note Care Guide Note  12/18/2024 Name: Diana Galvan MRN: [id number] DOB: 1966/01/15   Complex Care Management Outreach Attempts: An unsuccessful telephone outreach was attempted today to offer the patient information about available complex care management services.  Follow Up Plan:  Additional outreach attempts will be made to offer the patient complex care management information and services.   Encounter Outcome:  No Answer  Jeoffrey Buffalo , RMA     [place]  [facility], [facility] Guide  Direct Dial: [phone]  Website: [email or web address]

## 2024-12-24 ENCOUNTER — Other Ambulatory Visit: Payer: Self-pay

## 2024-12-24 NOTE — Patient Outreach (Signed)
 Social Drivers of Health  Community Resource and Care Coordination Visit Note   12/24/2024  Name: Diana Galvan MRN: [id number] DOB:[birth year]  Situation: Referral received for [facility] needs assessment and assistance related to Applying for Disability. I obtained verbal consent from Patient.  Visit completed with Parent and Patient on the phone.   Background:      Assessment:   Goals Addressed             This Visit's Progress    BSW Goals       Current SDOH Barriers:  Assistance with rent. Needing DME Rollator for work (Rollator has been received).  Interventions: SW sent resources for rent via email. SW sent PCP a message to inquire about sending a script for a rollator. SW asked about order placed for a scale and backbrace. SW sent another message to patients PCP. SW resend resources for rent via mail. Emailed information for Disability [facility]acy Center  [name], [place], [place]  Value Based [facility] Social Worker, Population Health [phone]           Recommendation:   Contact Disability Advocacy Center to assist with applying for disability.   Follow Up Plan:   Telephone follow-up 01/11/24 at 11am  [name], BSW, [facility]   Value Based [facility] Social Worker, Population Health [phone]
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
## 2024-12-24 NOTE — Patient Instructions (Signed)
 Visit Information  [name] was given information about Medicaid Managed Care team care coordination services as a part of their [facility] Community Plan Medicaid benefit.   If you would like to schedule transportation through your [facility], please call the following number at least 2 days in advance of your appointment: [phone]   Rides for urgent appointments can also be made after hours by calling Member Services.  Call the Behavioral Health Crisis Line at [phone], at any time, 24 hours a day, 7 days a week. If you are in danger or need immediate medical attention call 911.   Social Worker will follow up on 01/11/24  [name], BSW, MHA [place]  [facility] Social Worker, Population Health [phone]   Following is a copy of your plan of care:   Goals Addressed             This Visit's Progress    BSW Goals       Current SDOH Barriers:  Assistance with rent. Needing DME Rollator for work (Rollator has been received).  Interventions: SW sent resources for rent via email. SW sent PCP a message to inquire about sending a script for a rollator. SW asked about order placed for a scale and backbrace. SW sent another message to patients PCP. SW resend resources for rent via mail. Emailed information for [employer]  [name], [place], [place]  Value Based [place] Social Worker, Population Health [phone]

## 2024-12-24 NOTE — Patient Outreach (Signed)
 Complex Care Management   Visit Note  12/24/2024  Name:  Diana Galvan MRN: [id number] DOB: Aug 09, 1966  Situation: Referral received for Complex Care Management related to pain management I obtained verbal consent from Patient.  Visit completed with Patient  on the phone  Background:   Past Medical History:  Diagnosis Date   Asthma    Coronary artery disease    mild cad in mLAD on coronary CTA   Costochondritis    DDD (degenerative disc disease), lumbar    Depression    H/O blood clots 10/26/2002   History of Erb's palsy    Thyroid disease    Thyroid goiter     Assessment: Patient Reported Symptoms:  Cognitive Cognitive Status: No symptoms reported Cognitive/Intellectual Conditions Management [RPT]: None reported or documented in medical history or problem list   Health Maintenance Behaviors: Annual physical exam, Stress management, Spiritual practice(s) Healing Pattern: Average Health Facilitated by: Prayer/meditation  Neurological Neurological Review of Symptoms: Vision changes Oher Neurological Symptoms/Conditions [RPT]: no changes today Neurological Management Strategies: Routine screening Neurological Self-Management Outcome: 4 (good)  HEENT HEENT Symptoms Reported: No symptoms reported HEENT Management Strategies: Routine screening HEENT Self-Management Outcome: 4 (good)    Cardiovascular Cardiovascular Symptoms Reported: No symptoms reported Does patient have uncontrolled Hypertension?: No Cardiovascular Management Strategies: Routine screening, Adequate rest Cardiovascular Self-Management Outcome: 4 (good)  Respiratory Respiratory Symptoms Reported: No symptoms reported Respiratory Management Strategies: Routine screening Respiratory Self-Management Outcome: 4 (good)  Endocrine Endocrine Symptoms Reported: No symptoms reported Is patient diabetic?: No Endocrine Self-Management Outcome: 4 (good)  Gastrointestinal Gastrointestinal Symptoms Reported:  Other Gastrointestinal Management Strategies: Diet modification, Medication therapy, Coping strategies Gastrointestinal Self-Management Outcome: 4 (good)    Genitourinary Genitourinary Symptoms Reported: Incontinence Genitourinary Management Strategies: Coping strategies Genitourinary Self-Management Outcome: 4 (good)  Integumentary Integumentary Symptoms Reported: No symptoms reported Skin Management Strategies: Routine screening Skin Self-Management Outcome: 4 (good)  Musculoskeletal Musculoskelatal Symptoms Reviewed: Back pain, Unsteady gait, Weakness Musculoskeletal Management Strategies: Coping strategies, Adequate rest, Routine screening Musculoskeletal Self-Management Outcome: 3 (uncertain) Falls in the past year?: Yes Patient at Risk for Falls Due to: History of fall(s), Impaired balance/gait, Impaired mobility Fall risk Follow up: Falls prevention discussed  Psychosocial Psychosocial Symptoms Reported: Depression - if selected complete PHQ 2-9 Behavioral Management Strategies: Coping strategies, Librarian, Academic Health Self-Management Outcome: 4 (good) Major Change/Loss/Stressor/Fears (CP): Medical condition, self Techniques to Cope with Loss/Stress/Change: Withdraw, Medication Quality of Family Relationships: non-existent    12/24/2024    PHQ2-9 Depression Screening   Little interest or pleasure in doing things Nearly every day  Feeling down, depressed, or hopeless Nearly every day  PHQ-2 - Total Score 6  Trouble falling or staying asleep, or sleeping too much Nearly every day  Feeling tired or having little energy Nearly every day  Poor appetite or overeating  Nearly every day  Feeling bad about yourself - or that you are a failure or have let yourself or your family down Nearly every day  Trouble concentrating on things, such as reading the newspaper or watching television Nearly every day  Moving or speaking so slowly that other people could have noticed.   Or the opposite - being so fidgety or restless that you have been moving around a lot more than usual Nearly every day  Thoughts that you would be better off dead, or hurting yourself in some way Nearly every day  PHQ2-9 Total Score 27  If you checked off any problems, how difficult have these problems made it  for you to do your work, take care of things at home, or get along with other people    Depression Interventions/Treatment Counseling, Community Resources Provided    There were no vitals filed for this visit. Pain Scale: 0-10 Pain Score: 9  Pain Type: Chronic pain Pain Location: Hip Pain Orientation: Right Pain Descriptors / Indicators: Constant Pain Onset: On-going Patients Stated Pain Goal: 0 Pain Intervention(s): Medication (See eMAR), Shower Multiple Pain Sites: No  Medications Reviewed Today   Medications were not reviewed in this encounter     Recommendation:   Continue Current Plan of Care  Follow Up Plan:   Telephone follow-up in 1 month  Liliann File RN Medical Illustrator [employer] [phone]

## 2024-12-30 ENCOUNTER — Ambulatory Visit: Admitting: Podiatry

## 2024-12-30 ENCOUNTER — Telehealth: Payer: Self-pay | Admitting: Student in an Organized Health Care Education/Training Program

## 2024-12-30 ENCOUNTER — Ambulatory Visit (INDEPENDENT_AMBULATORY_CARE_PROVIDER_SITE_OTHER)

## 2024-12-30 VITALS — Ht 67.0 in | Wt 255.0 lb

## 2024-12-30 DIAGNOSIS — M79672 Pain in left foot: Secondary | ICD-10-CM

## 2024-12-30 DIAGNOSIS — B351 Tinea unguium: Secondary | ICD-10-CM | POA: Diagnosis not present

## 2024-12-30 DIAGNOSIS — G629 Polyneuropathy, unspecified: Secondary | ICD-10-CM

## 2024-12-30 DIAGNOSIS — B353 Tinea pedis: Secondary | ICD-10-CM

## 2024-12-30 MED ORDER — KETOCONAZOLE 2 % EX CREA: 1.0000 | TOPICAL_CREAM | Freq: Every day | CUTANEOUS | 2 refills | Status: DC

## 2024-12-30 MED ORDER — TERBINAFINE HCL 250 MG PO TABS: 250.0000 mg | ORAL_TABLET | Freq: Every day | ORAL | 0 refills | Status: DC

## 2024-12-30 NOTE — Progress Notes (Signed)
 Care Guide Pharmacy Note  12/30/2024 Name: Diana Galvan MRN: [id number] DOB: 1966-03-06  Referred By: Towana Small, FNP Reason for referral: Complex Care Management (Outreach to schedule with LCSW and Pharm d )   Diana Galvan is a 59 y.o. year old female who is a primary care patient of Towana Small, FNP.  Diana Galvan was referred to the pharmacist for assistance related to: Depression  Successful contact was made with the patient to discuss pharmacy services including being ready for the pharmacist to call at least 5 minutes before the scheduled appointment time and to have medication bottles and any blood pressure readings ready for review. The patient agreed to meet with the pharmacist via telephone visit on (date/time). 01/01/2025  Jeoffrey Buffalo , RMA       [facility], [facility] Guide  Direct Dial: [phone]  Website: [email or web address]

## 2024-12-30 NOTE — Progress Notes (Signed)
 Complex Care Management Note  Care Guide Note 12/30/2024 Name: Diana Galvan MRN: [id number] DOB: September 21, 1966  Diana Galvan is a 59 y.o. year old female who sees Towana Small, FNP for primary care. I reached out to Arland Olivia Deforest by phone today to offer complex care management services.  [name] was given information about Complex Care Management services today including:   The Complex Care Management services include support from the care team which includes your Nurse Care Manager, Clinical Social Worker, or Pharmacist.  The Complex Care Management team is here to help remove barriers to the health concerns and goals most important to you. Complex Care Management services are voluntary, and the patient may decline or stop services at any time by request to their care team member.   Complex Care Management Consent Status: Patient agreed to services and verbal consent obtained.   Follow up plan:  Telephone appointment with complex care management team member scheduled for:  12/31/2024  Encounter Outcome:  Patient Scheduled  Jeoffrey Buffalo , RMA     [place]  [facility], [facility] Guide  Direct Dial: [phone]  Website: [email or web address]

## 2024-12-31 ENCOUNTER — Other Ambulatory Visit: Payer: Self-pay | Admitting: Licensed Clinical Social Worker

## 2024-12-31 ENCOUNTER — Ambulatory Visit: Payer: Self-pay | Admitting: Physical Therapy

## 2025-01-01 ENCOUNTER — Other Ambulatory Visit: Payer: Self-pay | Admitting: Pharmacist

## 2025-01-01 ENCOUNTER — Telehealth: Payer: Self-pay

## 2025-01-01 NOTE — Progress Notes (Signed)
 Complex Care Management Care Guide Note  01/01/2025 Name: Diana Galvan MRN: [id number] DOB: July 30, 1966  Diana Galvan is a 59 y.o. year old female who is a primary care patient of Towana Small, FNP and is actively engaged with the care management team. I reached out to Arland Olivia Deforest by phone today to assist with re-scheduling  with the Pharmacist.  Follow up plan: Telephone appointment with complex care management team member scheduled for:  01/08/2025  Jeoffrey Buffalo , RMA     [place]  [facility], [facility] Guide  Direct Dial: [phone]  Website: [email or web address]

## 2025-01-01 NOTE — Progress Notes (Signed)
 Complex Care Management Care Guide Note  01/01/2025 Name: Diana Galvan MRN: [id number] DOB: 1966-09-18  Diana Galvan is a 59 y.o. year old female who is a primary care patient of Towana Small, FNP and is actively engaged with the care management team. I reached out to Arland Olivia Deforest by phone today to assist with re-scheduling  with the Pharmacist.  Follow up plan: Unsuccessful telephone outreach attempt made. A HIPAA compliant phone message was left for the patient providing contact information and requesting a return call.  Jeoffrey Buffalo , RMA     [facility]  [facility], [facility] Guide  Direct Dial: [phone]  Website: [email or web address]

## 2025-01-01 NOTE — Progress Notes (Signed)
 "  Subjective:  Patient ID: Diana Galvan, female    DOB: May 20, 1966,  MRN: [id number]  Chief Complaint  Patient presents with   Numbness     RM 6 New pt is for numbness of the left heel and left 4th and 5th toes. Pt states pain in the arch of the right foot. Pt states numbness and pain has been present for three years. Pt is also concerned with dry yellowish colored skin on the bottom of feet.     Discussed the use of AI scribe software for clinical note transcription with the patient, who gave verbal consent to proceed.  History of Present Illness Diana Abeln' is a 59 year old female with degenerative spine disease and osteoarthritis who presents for evaluation of chronic numbness, discoloration, pain, and skin and nail changes of the feet.  She has longstanding numbness of the left third and fourth toes, associated with brown discoloration. The remaining toes are unaffected. She also describes chronic heel pain and a sensation in the foot that feels fake. These symptoms have persisted for years and preceded the onset of limping and increased pain. She denies recent trauma.  She experiences recurrent hyperkeratosis of the feet, requiring regular soaking and mechanical debridement. The thickened skin recurs despite these measures; she last performed debridement one month ago and currently the skin is smooth. She denies walking barefoot and typically wears socks or house slippers.  She expresses concern for onychomycosis, particularly following pedicures, and reports a toenail lifting from the nail bed. She has a history of self-treatment for ingrown toenails.  She has difficulty managing foot care due to pain and limited reach, sometimes related to Erb's palsy, and inquires about methods to clean and care for her feet given these limitations.      Objective:    Physical Exam VASCULAR: 2+ peripheral pulses. Foot is warm and well-perfused. Capillary fill time is  brisk. DERMATOLOGIC: Diffuse tinea pedis with dry scaling skin in moccasin distribution, discoloration of multiple toenails. No open lesions or rashes or ulcerations. NEUROLOGIC: Altered sensation in left third and fourth toes. Negative tarsal tunnel sign. No paresthesias on examination. ORTHOPEDIC: Smooth pain-free range of motion of all examined joints. No pain to palpation along posterior tibial tendon bilaterally. No ecchymosis or bruising. No gross deformity.   No images are attached to the encounter.    Results Radiology Bilateral foot radiograph (12/30/2024): Mild degenerative changes in the midfoot, no other bony abnormalities (Independently interpreted)   Assessment:   1. Left foot pain      Plan:  Patient was evaluated and treated and all questions answered.  Assessment and Plan Assessment & Plan Peripheral neuropathy She has chronic numbness, discoloration of the left third and fourth toes, altered sensation, and pain in the feet. Neuropathy is likely secondary to spinal pathology, given her degenerative spine disease and prior falls. No evidence of vascular compromise or bony abnormality on examination or radiographs. Neuropathy appears non-podiatric in origin. - Recommended follow-up with pain management for further evaluation of neuropathy. - Documented recommendation for possible nerve conduction study (EMG/NCV) in communication with pain management. - Advised that no specific shoe brand or orthotic is indicated at this time.  Onychomycosis She has discoloration and onycholysis of multiple toenails, consistent with chronic fungal infection. - Prescribed oral terbinafine  (Lamisil ) for a 90-day course. - Educated that oral therapy may require three months for initial clearance and up to six months for full resolution of nail changes.  Tinea pedis  She has diffuse dry scaling skin in a moccasin distribution, consistent with chronic dermatophytosis of the feet. -  Prescribed topical ketoconazole  cream, to be applied twice daily. - Provided education regarding foot hygiene, including use of a long-handled brush for cleaning if limited by pain or mobility. - Advised that improvement may be gradual and recurrence is common. - Discussed that pedicures may contribute to recurrence if proper hygiene is not maintained at salons. - Advised to follow up as needed for persistent or recurrent symptoms.      Return if symptoms worsen or fail to improve.   "

## 2025-01-03 ENCOUNTER — Other Ambulatory Visit (HOSPITAL_BASED_OUTPATIENT_CLINIC_OR_DEPARTMENT_OTHER): Payer: Self-pay | Admitting: Student

## 2025-01-06 ENCOUNTER — Ambulatory Visit: Payer: Self-pay | Admitting: Physical Therapy

## 2025-01-07 ENCOUNTER — Other Ambulatory Visit: Payer: Self-pay | Admitting: Internal Medicine

## 2025-01-07 DIAGNOSIS — J45909 Unspecified asthma, uncomplicated: Secondary | ICD-10-CM

## 2025-01-08 ENCOUNTER — Encounter: Payer: Self-pay | Admitting: Pharmacist

## 2025-01-08 ENCOUNTER — Ambulatory Visit: Payer: Self-pay | Admitting: Physical Therapy

## 2025-01-08 ENCOUNTER — Other Ambulatory Visit: Payer: Self-pay | Admitting: Pharmacist

## 2025-01-08 DIAGNOSIS — J45901 Unspecified asthma with (acute) exacerbation: Secondary | ICD-10-CM

## 2025-01-08 DIAGNOSIS — Z8709 Personal history of other diseases of the respiratory system: Secondary | ICD-10-CM

## 2025-01-08 MED ORDER — BUDESONIDE-FORMOTEROL FUMARATE 160-4.5 MCG/ACT IN AERO: 2.0000 | INHALATION_SPRAY | Freq: Two times a day (BID) | RESPIRATORY_TRACT | 1 refills | Status: AC

## 2025-01-08 MED ORDER — ALBUTEROL SULFATE HFA 108 (90 BASE) MCG/ACT IN AERS: 2.0000 | INHALATION_SPRAY | Freq: Four times a day (QID) | RESPIRATORY_TRACT | 3 refills | Status: AC | PRN

## 2025-01-08 NOTE — Patient Instructions (Signed)
 It was a pleasure speaking with you today!   Your provider has sent a renewal of your Symbicort  prescription to your pharmacy  [facility] [address] ([address]), [place] - [address] [place]) [place] [address] Phone: [phone]   Please follow up with pharmacy regarding picking up your inhaler. Please remember to use your Symbicort  Inhaler twice daily as directed and remember to rinse and spit out after each use.  Also, the following is the contact information for [facility] that you requested:  [facility] [address] [place], [place] [address] [phone]  Thank you!  Sharyle Sia, PharmD, [facility] [phone]

## 2025-01-08 NOTE — Progress Notes (Signed)
 "  01/08/2025 Name: Kemara Quigley MRN: [id number] DOB: May 26, 1966  Chief Complaint  Patient presents with   Medication Assistance    Diana Galvan is a 59 y.o. year old female who presented for a telephone visit.   They were referred to the pharmacist by their PCP for assistance in managing medication access.    Subjective:  Care Team: Primary Care Provider: Towana Small, FNP  Pain Management Specialist: Tanya Glisson, MD; Next Scheduled Visit: 01/14/2025  Dietitian: Gail Sharlet KIDD, RD; Next Scheduled Visit: 01/29/2025 Social Worker: Delene Thersia PARAS; Next Scheduled Visit: 01/10/2025  Veva Bolt, LCSW; Next Scheduled Visit: 01/30/2025 Nurse Care Manager: Prentiss Heddy HERO, RN; Next Scheduled Visit: 01/21/2025  GI Specialist: Melany Clotilda HERO, MD; Next Scheduled Visit: 01/23/2025  Orthopedic Specialist: Mariah Thamas RAMAN, MD; Next Scheduled Visit: 03/03/2025  Medication Access/Adherence  Current Pharmacy:  [facility] [address] ([address]),  - [address] [place] ([place]) [place] [address] Phone: [phone] Fax: [phone]   Patient reports affordability concerns with their medications: Yes  Patient reports access/transportation concerns to their pharmacy: No  Patient reports adherence concerns with their medications:  Yes  Reports financial strain at this time. Note patient is also currently working with Social Work team  Reports previously had difficulty with picking up her medications from [employer] as she was unable to afford her Medicaid copayments. However, reports that yesterday found that [facility] was able to override in order to bill her for these copayments, allowing her to pick up 4 of her prescriptions yesterday.  - Reports still needing to pick up 2 medications: refill of albuterol  inhaler and renewal of Symbicort  (but needing new prescription)  Asthma:  Current medications: -  albuterol  HFA inhaler - 2 puffs every 6 hours as needed for wheezing or shortness of breath  - Reports previously using Symbicort  inhaler, but not currently using as needing new prescription   Tobacco Use: Reports currently working on cutting back; currently smoking ~3-4 cigarettes/day Motivation: my breathing; fear of cancer or not breathing Triggers: boredom and stress Strategies: working on getting back to work to avoid triggers   Denies interest in nicotine replacement therapy or other medications to assist with smoking cessation, rather prefers to continue to work on gradually cutting back on her own   Objective:   Lab Results  Component Value Date   CREATININE 0.68 06/06/2023   BUN 15 06/06/2023   NA 135 06/06/2023   K 3.4 (L) 06/06/2023   CL 101 06/06/2023   CO2 23 06/06/2023    Lab Results  Component Value Date   CHOL 167 01/03/2023   HDL 61 01/03/2023   LDLCALC 95 01/03/2023   TRIG 39 01/03/2023   CHOLHDL 2.7 01/03/2023    Medications Reviewed Today     Reviewed by Alana Sharyle LABOR, RPH-CPP (Pharmacist) on 01/08/25 at 1118  Med List Status: <None>   Medication Order Taking? Sig Documenting Provider Last Dose Status Informant  ACCU-CHEK GUIDE TEST test strip [id number]  USE TO CHECK BLOOD SUGAR IN THE MORNING, AT NOON, AND AT BEDTIME [provider]  Active   albuterol  (VENTOLIN  HFA) 108 (90 Base) MCG/ACT inhaler [id number] Yes Inhale 2 puffs into the lungs every 6 (six) hours as needed for wheezing or shortness of breath. Towana Small, FNP  Active   Blood Glucose Monitoring Suppl DEVI [id number]  1 each by Does not apply route in the morning, at noon, and at bedtime. May substitute to  any manufacturer covered by [employer]. Towana Small, FNP  Active   budesonide -formoterol  (SYMBICORT ) 160-4.5 MCG/ACT inhaler [id number]  Inhale 2 puffs into the lungs 2 (two) times daily.  Patient not taking: Reported on 12/30/2024   Tapia, Leisa, PA-C   Active   citalopram  (CELEXA ) 10 MG tablet [id number] Yes Take 1 tablet (10 mg total) by mouth daily. Towana Small, FNP  Active   diclofenac  (CATAFLAM ) 50 MG tablet [id number] Yes Take 1 tablet (50 mg total) by mouth 3 (three) times daily. Mariah Thamas RAMAN, MD  Active   ketoconazole  (NIZORAL ) 2 % cream [id number] Yes Apply 1 Application topically daily. Silva Juliene SAUNDERS, DPM  Active   terbinafine  (LAMISIL ) 250 MG tablet [id number] Yes Take 1 tablet (250 mg total) by mouth daily. Silva Juliene SAUNDERS, DPM  Active               Assessment/Plan:   Comprehensive medication review performed; medication list updated in electronic medical record   Asthma: - Collaborate with PCP to request renewal of Symbicort  inhaler  Provider sends renewal to [facility] for patient - Patient plans to follow up with [facility] today regarding picking up refills of both Symbicort  and albuterol  inhalers - Reviewed appropriate inhaler technique including importance of rinsing and spitting out after each use of Symbicort    Tobacco Abuse - Provided motivational interviewing to assess tobacco use and strategies for reduction - Patient denies interest in nicotine replacement therapy or other medications to assist with smoking cessation, rather prefers to continue to work on gradually cutting back on her own   Follow Up Plan:   Patient denies further medication questions or concerns today Provide patient with contact information for clinic pharmacist to contact if needed in future for medication questions/concerns   Sharyle Sia, PharmD, [facility] [phone]    "

## 2025-01-08 NOTE — Progress Notes (Signed)
 Patient requesting renewal of her Symbicort  prescription. Reports that she is currently out of this medication and current prescription is expired.  Would you please consider sending renewal to [facility] for her?  Thank you!  Sharyle Sia, PharmD, [facility] [phone]

## 2025-01-09 ENCOUNTER — Other Ambulatory Visit: Payer: Self-pay

## 2025-01-09 MED ORDER — HYDROXYZINE PAMOATE 25 MG PO CAPS
25.0000 mg | ORAL_CAPSULE | ORAL | 1 refills | Status: AC | PRN
  Filled 2025-01-09: qty 90, 30d supply, fill #0

## 2025-01-09 MED ORDER — CITALOPRAM HYDROBROMIDE 20 MG PO TABS
30.0000 mg | ORAL_TABLET | Freq: Every day | ORAL | 1 refills | Status: AC
  Filled 2025-01-09: qty 45, 30d supply, fill #0

## 2025-01-09 MED ORDER — TRAZODONE HCL 100 MG PO TABS
100.0000 mg | ORAL_TABLET | Freq: Every day | ORAL | 1 refills | Status: AC
  Filled 2025-01-09: qty 30, 30d supply, fill #0

## 2025-01-10 ENCOUNTER — Other Ambulatory Visit: Payer: Self-pay

## 2025-01-10 ENCOUNTER — Telehealth: Payer: Self-pay

## 2025-01-10 MED ORDER — DICLOFENAC POTASSIUM 50 MG PO TABS
50.0000 mg | ORAL_TABLET | Freq: Three times a day (TID) | ORAL | 3 refills | Status: AC
  Filled 2025-01-10: qty 90, 30d supply, fill #0

## 2025-01-10 MED ORDER — KETOCONAZOLE 2 % EX CREA
1.0000 | TOPICAL_CREAM | Freq: Every day | CUTANEOUS | 2 refills | Status: AC
  Filled 2025-01-10: qty 60, 30d supply, fill #0
  Filled 2025-01-11: qty 60, 34d supply, fill #0

## 2025-01-10 MED ORDER — ALBUTEROL SULFATE HFA 108 (90 BASE) MCG/ACT IN AERS
2.0000 | INHALATION_SPRAY | Freq: Four times a day (QID) | RESPIRATORY_TRACT | 3 refills | Status: AC | PRN
  Filled 2025-01-10: qty 6.7, 25d supply, fill #0

## 2025-01-10 MED ORDER — CITALOPRAM HYDROBROMIDE 20 MG PO TABS: 30.0000 mg | ORAL_TABLET | Freq: Every day | ORAL | 1 refills | Status: AC

## 2025-01-10 MED ORDER — BUDESONIDE-FORMOTEROL FUMARATE 160-4.5 MCG/ACT IN AERO
2.0000 | INHALATION_SPRAY | Freq: Two times a day (BID) | RESPIRATORY_TRACT | 1 refills | Status: AC
  Filled 2025-01-10 – 2025-01-16 (×2): qty 10.2, 30d supply, fill #0

## 2025-01-10 MED ORDER — CITALOPRAM HYDROBROMIDE 10 MG PO TABS
10.0000 mg | ORAL_TABLET | Freq: Every day | ORAL | 3 refills | Status: DC
  Filled 2025-01-10: qty 30, 30d supply, fill #0

## 2025-01-10 MED ORDER — TERBINAFINE HCL 250 MG PO TABS: 250.0000 mg | ORAL_TABLET | Freq: Every day | ORAL | 0 refills | Status: AC

## 2025-01-10 NOTE — Patient Instructions (Signed)
 Arland Olivia Deforest - I am sorry I was unable to reach you today for our scheduled appointment. I work with Towana Small, FNP and am calling to support your healthcare needs. Please contact me at [phone] at your earliest convenience. I look forward to speaking with you soon.   Thank you,  [name], BSW, MHA [place]  [facility] Social Worker, Population Health [phone]

## 2025-01-11 ENCOUNTER — Other Ambulatory Visit: Payer: Self-pay

## 2025-01-13 ENCOUNTER — Other Ambulatory Visit: Payer: Self-pay | Admitting: Family Medicine

## 2025-01-13 ENCOUNTER — Other Ambulatory Visit: Payer: Self-pay

## 2025-01-14 ENCOUNTER — Ambulatory Visit: Attending: Pain Medicine | Admitting: Pain Medicine

## 2025-01-14 ENCOUNTER — Encounter: Payer: Self-pay | Admitting: Pain Medicine

## 2025-01-14 VITALS — BP 143/88 | HR 65 | Temp 97.2°F | Resp 16 | Ht 67.0 in | Wt 255.0 lb

## 2025-01-14 DIAGNOSIS — M549 Dorsalgia, unspecified: Secondary | ICD-10-CM | POA: Diagnosis present

## 2025-01-14 DIAGNOSIS — M5136 Other intervertebral disc degeneration, lumbar region with discogenic back pain only: Secondary | ICD-10-CM | POA: Insufficient documentation

## 2025-01-14 DIAGNOSIS — M25511 Pain in right shoulder: Secondary | ICD-10-CM | POA: Diagnosis present

## 2025-01-14 DIAGNOSIS — M899 Disorder of bone, unspecified: Secondary | ICD-10-CM | POA: Insufficient documentation

## 2025-01-14 DIAGNOSIS — G894 Chronic pain syndrome: Secondary | ICD-10-CM | POA: Diagnosis not present

## 2025-01-14 DIAGNOSIS — M1611 Unilateral primary osteoarthritis, right hip: Secondary | ICD-10-CM | POA: Insufficient documentation

## 2025-01-14 DIAGNOSIS — Z789 Other specified health status: Secondary | ICD-10-CM | POA: Diagnosis present

## 2025-01-14 DIAGNOSIS — M79605 Pain in left leg: Secondary | ICD-10-CM | POA: Insufficient documentation

## 2025-01-14 DIAGNOSIS — Z79899 Other long term (current) drug therapy: Secondary | ICD-10-CM | POA: Diagnosis present

## 2025-01-14 DIAGNOSIS — Z87898 Personal history of other specified conditions: Secondary | ICD-10-CM | POA: Diagnosis present

## 2025-01-14 DIAGNOSIS — M19011 Primary osteoarthritis, right shoulder: Secondary | ICD-10-CM | POA: Diagnosis not present

## 2025-01-14 DIAGNOSIS — M5134 Other intervertebral disc degeneration, thoracic region: Secondary | ICD-10-CM | POA: Insufficient documentation

## 2025-01-14 DIAGNOSIS — M5030 Other cervical disc degeneration, unspecified cervical region: Secondary | ICD-10-CM | POA: Insufficient documentation

## 2025-01-14 DIAGNOSIS — M545 Low back pain, unspecified: Secondary | ICD-10-CM | POA: Insufficient documentation

## 2025-01-14 DIAGNOSIS — M25551 Pain in right hip: Secondary | ICD-10-CM | POA: Insufficient documentation

## 2025-01-14 DIAGNOSIS — M79604 Pain in right leg: Secondary | ICD-10-CM | POA: Diagnosis present

## 2025-01-14 DIAGNOSIS — M542 Cervicalgia: Secondary | ICD-10-CM | POA: Diagnosis present

## 2025-01-14 DIAGNOSIS — R1031 Right lower quadrant pain: Secondary | ICD-10-CM | POA: Insufficient documentation

## 2025-01-14 DIAGNOSIS — M47816 Spondylosis without myelopathy or radiculopathy, lumbar region: Secondary | ICD-10-CM | POA: Diagnosis not present

## 2025-01-14 DIAGNOSIS — M5459 Other low back pain: Secondary | ICD-10-CM | POA: Insufficient documentation

## 2025-01-14 DIAGNOSIS — M25512 Pain in left shoulder: Secondary | ICD-10-CM | POA: Diagnosis not present

## 2025-01-14 DIAGNOSIS — M7631 Iliotibial band syndrome, right leg: Secondary | ICD-10-CM | POA: Insufficient documentation

## 2025-01-14 DIAGNOSIS — G8929 Other chronic pain: Secondary | ICD-10-CM | POA: Insufficient documentation

## 2025-01-14 NOTE — Patient Instructions (Signed)

## 2025-01-14 NOTE — Progress Notes (Signed)
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 kg)  12/16/24 255 lb 12.8 oz (116 kg)  12/04/24 253 lb (114.8 kg)    Psych/Mental status: Alert, oriented x 3 (person, place, & time)       Eyes: PERLA Respiratory: No evidence of acute respiratory distress  Assessment  Primary Diagnosis & Pertinent Problem List: The primary encounter diagnosis was Chronic pain syndrome. Diagnoses of Pharmacologic therapy, Disorder of skeletal system, Problems influencing health status, Chronic mid back pain, Chronic neck pain, DDD (degenerative disc disease), cervical, Cervicalgia, Primary osteoarthritis of right shoulder, Osteoarthritis of right AC (acromioclavicular)  joint, Osteoarthritis of glenohumeral joint, right, DDD (degenerative disc disease), thoracic, Lumbar DDD, Lumbar facet hypertrophy (L3-4, L4-5, L5-S1), Primary osteoarthritis of right hip, Chronic lower extremity pain (1ry area of Pain) (Right), Chronic iliotibial band syndrome (Right), Chronic hip pain (Right), Chronic groin pain (Right), Chronic low back pain (Bilateral) w/o sciatica, Low back pain of over 3 months duration, Low back pain radiating to both legs, Intermittent low back pain, Mechanical low back pain, Multifactorial low back pain, Discogenic low back pain, Chronic shoulder pain (Bilateral) (R>L), History of Erb's palsy (Left), Erb's palsy as birth trauma (Left), Neck pain of over 3 months duration, Myofascial neck pain, Posterior neck pain, and Neck pain, bilateral were also pertinent to this visit.  Visit Diagnosis (New problems to examiner): 1. Chronic pain syndrome   2. Pharmacologic therapy   3. Disorder of skeletal system   4. Problems influencing health status   5. Chronic mid back pain   6. Chronic neck pain   7.  DDD (degenerative disc disease), cervical   8. Cervicalgia   9. Primary osteoarthritis of right shoulder   10. Osteoarthritis of right AC (acromioclavicular) joint   11. Osteoarthritis of glenohumeral joint, right   12. DDD (degenerative disc disease), thoracic   13. Lumbar DDD   14. Lumbar facet hypertrophy (L3-4, L4-5, L5-S1)   15. Primary osteoarthritis of right hip   16. Chronic lower extremity pain (1ry area of Pain) (Right)   17. Chronic iliotibial band syndrome (Right)   18. Chronic hip pain (Right)   19. Chronic groin pain (Right)   20. Chronic low back pain (Bilateral) w/o sciatica   21. Low back pain of over 3 months duration   22. Low back pain radiating to both legs   23. Intermittent low back pain   24. Mechanical low back pain   25. Multifactorial low back pain   26. Discogenic low back pain   27. Chronic shoulder pain (Bilateral) (R>L)    28. History of Erb's palsy (Left)   29. Erb's palsy as birth trauma (Left)   30. Neck pain of over 3 months duration   31. Myofascial neck pain   32. Posterior neck pain   33. Neck pain, bilateral    Plan of Care (Initial workup plan)  Note: Ms. Speckman was reminded that as per protocol, today's visit has been an evaluation only. We have not taken over the patient's controlled substance management.  Problem-specific plan: Assessment and Plan    Primary osteoarthritis of right hip   Chronic right hip pain began suddenly in February 2023, remains constant and severe, worsens with movement, and eases with rest. Imaging showed near bone-on-bone contact. Multiple hip injections provided temporary relief. She experiences decreased range of motion and limping. A recent loud pop in the right groin caused significant pain, now resolved. No MRI of the hip has been performed. Ordered blood work and reviewed previous imaging studies. Initiate physical therapy and consider nerve conduction studies.  Lumbar spondylosis and disc degeneration with chronic low back pain   Chronic low back pain with intermittent exacerbations worsens with activities like sweeping, folding clothes, and walking. Imaging revealed degenerative spondylosis at L3-L4 and L4-L5 with disc degeneration. An epidural steroid injection provided relief for about four months. No recent physical therapy for the back. Ordered blood work and reviewed previous imaging studies. Initiate physical therapy and consider nerve conduction studies.  Cervical spondylosis and disc degeneration with chronic neck pain   Chronic neck pain with intermittent numbness and tingling in the right upper extremity, especially affecting the thumb, index, and middle fingers. Imaging indicated degenerative spondylosis at C3-C4, C4-C5, and C5-C6. Dry needling provided relief on the right side. No recent physical therapy for the neck. Ordered blood work and reviewed  previous imaging studies. Initiate physical therapy and consider nerve conduction studies.  Chronic pain syndrome   She experiences pain in multiple areas including the right hip, low back, neck, and left foot. Pain management includes diclofenac , which is well-tolerated. Previous use of meloxicam  caused swelling. She is not currently using blood thinners. History of DVT in 2005 was treated with aspirin  and blood thinners. Ordered blood work and reviewed previous imaging studies. Initiate physical therapy and consider nerve conduction studies.       Lab Orders         Compliance Drug Analysis, Ur         Comp. Metabolic Panel (12)         Magnesium  Vitamin B12         Sedimentation rate         25-Hydroxy vitamin D Lcms D2+D3         C-reactive protein     Imaging Orders         DG Cervical Spine With Flex & Extend         DG Lumbar Spine Complete W/Bend         DG Shoulder Right         DG Shoulder Left         DG HIPS BILAT W OR W/O PELVIS MIN 5 VIEWS     Referral Orders         Ambulatory referral to Physical Therapy     Procedure Orders    No procedure(s) ordered today   Pharmacotherapy (current): Medications ordered:  No orders of the defined types were placed in this encounter.  Medications administered during this visit: Trixie Maclaren. Cori Justus' had no medications administered during this visit.   Analgesic Pharmacotherapy:  Opioid Analgesics: For patients currently taking or requesting to take opioid analgesics, in accordance with [place]  Medical Board Guidelines, we will assess their risks and indications for the use of these substances. After completing our evaluation, we may offer recommendations, but we no longer take patients for medication management. The prescribing physician will ultimately decide, based on his/her training and level of comfort whether to adopt any of the recommendations, including whether or not to prescribe such medicines.   Membrane stabilizer: To be determined at a later time  Muscle relaxant: To be determined at a later time  NSAID: To be determined at a later time  Other analgesic(s): To be determined at a later time   Interventional management options: Ms. Piehl was informed that there is no guarantee that she would be a candidate for interventional therapies. The decision will be based on the results of diagnostic studies, as well as Ms. Route's risk profile.  Procedure(s) under consideration:  Pending results of ordered studies     Interventional Therapies  Risk Factors  Considerations  Medical Comorbidities:     Planned  Pending:      Under consideration:   Pending   Completed: (Analgesic benefit)1  None at this time   Therapeutic  Palliative (PRN) options:   None established   Completed by other providers:   None reported  1(Analgesic benefit): Expressed in percentage (%). (Local anesthetic[LA] +/- sedation  L.A.Local Anesthetic  Steroid benefit  Ongoing benefit)   Provider-requested follow-up: Return in about 2 weeks (around 01/28/2025) for ( ), Eval-day (M,W), (Face2F), 2nd Visit, to review of ordered test(s).  Future Appointments  Date Time Provider Department Center  01/21/2025  9:30 AM Wallace, Juana M, RN [facility] None  01/23/2025  1:00 PM Melany Clotilda HERO, MD [facility] None  01/27/2025  3:00 PM Delene Rouse J [facility] None  01/29/2025  2:00 PM Gail Sharlet KIDD, RD [facility] None  01/30/2025 11:00 AM Veva Bolt, LCSW [facility] None  02/10/2025  2:40 PM Tanya Glisson, MD [facility] None  03/04/2025  1:00 PM Mariah Thamas RAMAN, MD [facility] None   I discussed the assessment and treatment plan with the patient. The patient was provided an opportunity to ask questions and all were answered. The patient agreed with the plan and demonstrated an understanding of the instructions.  Patient advised to call back or seek an in-person evaluation if the symptoms or condition  worsens.  Duration of encounter: 123 minutes.  Total time on encounter, as per AMA guidelines included both the face-to-face and non-face-to-face time personally spent by the physician and/or other qualified health care professional(s) on the day of the encounter (includes time in activities that require the physician or other qualified health care professional and does not include time in activities normally performed by clinical staff). Physician's time may include the following activities when performed: Preparing to see the patient (e.g., pre-charting review of records, searching for previously ordered imaging, lab work, and nerve conduction tests) Review of prior analgesic pharmacotherapies. Reviewing PMP Interpreting ordered tests (e.g., lab work, imaging, nerve conduction tests) Performing post-procedure evaluations, including interpretation of diagnostic procedures Obtaining and/or reviewing separately obtained history Performing a medically appropriate examination and/or evaluation Counseling and educating the patient/family/caregiver Ordering medications, tests, or procedures Referring and communicating with other health care professionals (when not separately reported) Documenting clinical information in the electronic or other health record Independently interpreting results (not separately reported) and communicating results to the patient/ family/caregiver Care coordination (not separately reported)  Note by: Eric DELENA Como, MD (TTS and AI technology used. I apologize for any typographical errors that were not detected and corrected.) Date: 01/14/2025; Time: 4:54 PM     [1]  Current Outpatient Medications:    ACCU-CHEK GUIDE TEST test strip, USE TO CHECK BLOOD SUGAR IN THE MORNING, AT NOON, AND AT BEDTIME, Disp: , Rfl:    albuterol  (VENTOLIN  HFA) 108 (90 Base) MCG/ACT inhaler, Inhale 2 puffs into the lungs every 6 (six) hours as needed for wheezing or shortness of breath.,  Disp: 9 g, Rfl: 3   albuterol  (VENTOLIN  HFA) 108 (90 Base) MCG/ACT inhaler, Inhale 2 puffs into the lungs every 6 (six) hours as needed for wheezing or shortness of breath., Disp: 6.7 g, Rfl: 3   Blood Glucose Monitoring Suppl DEVI, 1 each by Does not apply route in the morning, at noon, and at bedtime. May substitute to any manufacturer covered by patient's insurance., Disp: 1 each, Rfl: 0   budesonide -formoterol  (SYMBICORT ) 160-4.5 MCG/ACT inhaler, Inhale 2 puffs into the lungs 2 (two) times daily., Disp: 10.2 g, Rfl: 1   budesonide -formoterol  (SYMBICORT ) 160-4.5 MCG/ACT inhaler, Inhale 2 puffs into the lungs 2 (two) times daily., Disp: 10.2 g, Rfl: 1   citalopram  (CELEXA ) 10 MG tablet, Take 1 tablet (10 mg total) by mouth daily., Disp: 30 tablet, Rfl: 3   citalopram  (CELEXA ) 10 MG tablet, Take 1 tablet (10 mg total) by mouth daily., Disp: 30 tablet, Rfl: 3   citalopram  (CELEXA ) 20 MG tablet, Take 1.5 tablets (30 mg total) by mouth daily., Disp: 45 tablet, Rfl: 1   citalopram  (CELEXA ) 20 MG tablet, Take 1.5 tablets (30 mg total) by mouth daily., Disp: 45 tablet, Rfl: 1   diclofenac  (CATAFLAM ) 50 MG tablet, Take 1 tablet (50 mg total) by mouth 3 (three) times daily., Disp: 90 tablet, Rfl: 3   diclofenac  (CATAFLAM ) 50 MG tablet, Take 1 tablet (50 mg total) by mouth 3 (three) times daily., Disp: 90 tablet, Rfl: 3   hydrOXYzine  (VISTARIL ) 25 MG capsule, Take 1 capsule (25 mg total) by mouth as needed for anxiety (up to three times daily)., Disp: 90 capsule, Rfl: 1   ketoconazole  (NIZORAL ) 2 % cream, Apply 1 Application topically daily., Disp: 60 g, Rfl: 2   ketoconazole  (NIZORAL ) 2 % cream, Apply 1 Application topically daily., Disp: 60 g, Rfl: 2   terbinafine  (LAMISIL ) 250 MG tablet, Take 1 tablet (250 mg total) by mouth daily., Disp: 84 tablet,  Rfl: 0   terbinafine  (LAMISIL ) 250 MG tablet, Take 1 tablet (250 mg total) by mouth daily., Disp: 84 tablet, Rfl: 0   traZODone  (DESYREL ) 100 MG tablet, Take  one tablet (100mg ) by mouth one hour before bed, Disp: 30 tablet, Rfl: 1

## 2025-01-14 NOTE — Telephone Encounter (Signed)
 done

## 2025-01-15 ENCOUNTER — Other Ambulatory Visit (HOSPITAL_COMMUNITY): Payer: Self-pay

## 2025-01-15 ENCOUNTER — Encounter: Payer: Self-pay | Admitting: Pain Medicine

## 2025-01-16 ENCOUNTER — Other Ambulatory Visit: Payer: Self-pay

## 2025-01-21 ENCOUNTER — Encounter: Payer: Self-pay | Admitting: Pain Medicine

## 2025-01-21 ENCOUNTER — Telehealth: Payer: Self-pay

## 2025-01-21 NOTE — Patient Instructions (Signed)
 Diana Galvan - I am sorry I was unable to reach you today for our scheduled appointment. I work with Towana Small, FNP and am calling to support your healthcare needs. Please contact me at [phone] at your earliest convenience. I look forward to speaking with you soon.   Thank you,   Heddy Shutter, RN, MSN, BSN, CCM [place]  [facility], Population Health Case Manager Phone: [phone]

## 2025-01-22 ENCOUNTER — Encounter: Admitting: Dietician

## 2025-01-23 ENCOUNTER — Other Ambulatory Visit: Payer: Self-pay

## 2025-01-23 ENCOUNTER — Encounter: Payer: Self-pay | Admitting: Gastroenterology

## 2025-01-23 ENCOUNTER — Ambulatory Visit: Admitting: Gastroenterology

## 2025-01-23 VITALS — BP 121/78 | HR 73 | Temp 98.4°F | Ht 67.0 in | Wt 262.0 lb

## 2025-01-23 DIAGNOSIS — G8929 Other chronic pain: Secondary | ICD-10-CM | POA: Diagnosis not present

## 2025-01-23 DIAGNOSIS — Z860100 Personal history of colon polyps, unspecified: Secondary | ICD-10-CM | POA: Diagnosis not present

## 2025-01-23 DIAGNOSIS — K5900 Constipation, unspecified: Secondary | ICD-10-CM | POA: Diagnosis not present

## 2025-01-23 DIAGNOSIS — K921 Melena: Secondary | ICD-10-CM | POA: Diagnosis not present

## 2025-01-23 DIAGNOSIS — R1031 Right lower quadrant pain: Secondary | ICD-10-CM | POA: Diagnosis not present

## 2025-01-23 MED ORDER — NA SULFATE-K SULFATE-MG SULF 17.5-3.13-1.6 GM/177ML PO SOLN
1.0000 | Freq: Once | ORAL | 0 refills | Status: AC
  Filled 2025-01-23: qty 354, 1d supply, fill #0

## 2025-01-23 MED ORDER — LUBIPROSTONE 8 MCG PO CAPS
8.0000 ug | ORAL_CAPSULE | Freq: Two times a day (BID) | ORAL | 11 refills | Status: AC
  Filled 2025-01-23: qty 60, 30d supply, fill #0

## 2025-01-23 NOTE — Progress Notes (Signed)
 "   Gastroenterology Consultation  Referring Provider:     Towana Small, FNP Primary Care Physician:  Towana Small, FNP Primary Gastroenterologist:  Dr. Melany     Reason for Consultation:     LLQ pain, bloating, and chronic constipation         HPI:   Diana Galvan is a 59 y.o. y/o female referred for consultation & management of LLQ abdominal pain, bloating and constipation by Dr. Towana Small, FNP.    Diana Galvan reports a history of chronic constipation with fecal impaction 3 years ago that lasted for 3 days.  She eventually was able to relieve the impaction through a combination of physical positioning and digital disimpaction.  She has now changed her diet, eating oatmeal, probiotic shots, apple juice and activity every day to help her go.  With this regimen she has 1-2 bowel movements per day, sometimes large.  Despite this, she feels full, and has noted that when she is relatively backed up, she has pain in the right groin and pain when she walks.  She strains significantly to achieve bowel movement, requires multiple position changes, and is seeing bright red blood in her stool and on the toilet paper multiple times.  She also relates abdominal distention after eating and has noted unexplained weight gain.  She has lots of gas and says her bowel movements are stinky.  She does not consume dairy or artificial sweeteners.  Finally, she can feel a ball inside her rectum when she sits.  Last colonoscopy was in 01/2020 and was notable for pan-diverticulosis. She was recommended to return in 5 years because of a personal history of colon polyps..  There is no family history of colon cancer.    Past Medical History:  Diagnosis Date   Asthma    Coronary artery disease    mild cad in mLAD on coronary CTA   Costochondritis    DDD (degenerative disc disease), lumbar    Depression    H/O blood clots 10/26/2002   History of Erb's palsy    Thyroid disease     Thyroid goiter     Past Surgical History:  Procedure Laterality Date   APPENDECTOMY     COLONOSCOPY WITH PROPOFOL  N/A 02/03/2020   Procedure: COLONOSCOPY WITH PROPOFOL ;  Surgeon: Jinny Carmine, MD;  Location: [facility] ENDOSCOPY;  Service: Endoscopy;  Laterality: N/A;  Priority 4   THYROIDECTOMY     TUBAL LIGATION     VAGINAL HYSTERECTOMY  10/30/2002    Prior to Admission medications  Medication Sig Start Date End Date Taking? Authorizing Provider  ACCU-CHEK GUIDE TEST test strip USE TO CHECK BLOOD SUGAR IN THE MORNING, AT NOON, AND AT BEDTIME 07/15/24  Yes [provider]  albuterol  (VENTOLIN  HFA) 108 (90 Base) MCG/ACT inhaler Inhale 2 puffs into the lungs every 6 (six) hours as needed for wheezing or shortness of breath. 01/08/25  Yes Towana Small, FNP  Blood Glucose Monitoring Suppl DEVI 1 each by Does not apply route in the morning, at noon, and at bedtime. May substitute to any manufacturer covered by patient's insurance. 07/15/24  Yes Butler, Kristina, FNP  budesonide -formoterol  (SYMBICORT ) 160-4.5 MCG/ACT inhaler Inhale 2 puffs into the lungs 2 (two) times daily. 01/08/25  Yes Towana Small, FNP  citalopram  (CELEXA ) 20 MG tablet Take 1.5 tablets (30 mg total) by mouth daily. 01/09/25  Yes   diclofenac  (CATAFLAM ) 50 MG tablet Take 1 tablet (50 mg total) by mouth 3 (three) times daily. 12/18/24  Yes  Mariah Thamas RAMAN, MD  hydrOXYzine  (VISTARIL ) 25 MG capsule Take 1 capsule (25 mg total) by mouth as needed for anxiety (up to three times daily). 01/09/25  Yes   ketoconazole  (NIZORAL ) 2 % cream Apply 1 Application topically daily. 12/31/24  Yes McDonald, Juliene SAUNDERS, DPM  terbinafine  (LAMISIL ) 250 MG tablet Take 1 tablet (250 mg total) by mouth daily. 12/31/24  Yes McDonald, Juliene SAUNDERS, DPM  traZODone  (DESYREL ) 100 MG tablet Take one tablet (100mg ) by mouth one hour before bed 01/09/25  Yes   citalopram  (CELEXA ) 20 MG tablet Take 1.5 tablets (30 mg total) by mouth daily. 01/10/25       Family History   Problem Relation Age of Onset   Hypertension Mother    Hyperlipidemia Mother    Mental illness Mother    Heart Problems Mother    Asthma Father    Heart disease Father    Congestive Heart Failure Paternal Aunt    Heart failure Paternal Aunt    Heart failure Paternal Grandmother    Breast cancer Cousin      Social History[1]  Allergies as of 01/23/2025 - Review Complete 01/23/2025  Allergen Reaction Noted   Oxycodone-acetaminophen  Other (See Comments) 05/04/2018   Adhesive [tape] Other (See Comments) 09/11/2019   Wool alcohol [lanolin] Itching 09/11/2019    Review of Systems:    All systems reviewed and negative except where noted in HPI.   Physical Exam:  BP 121/78 (BP Location: Right Arm, Patient Position: Sitting, Cuff Size: Large)   Pulse 73   Temp 98.4 F (36.9 C) (Oral)   Ht 5' 7 (1.702 m)   Wt 262 lb (118.8 kg)   BMI 41.04 kg/m  No LMP recorded. Patient has had a hysterectomy. General:   Alert,  Well-developed, well-nourished, pleasant and cooperative in NAD Head:  Normocephalic and atraumatic. Eyes:  Sclera clear, no icterus.   Conjunctiva pink. Ears:  Normal auditory acuity. Neck:  Supple; no masses or thyromegaly. Lungs:  Respirations even and unlabored.  Clear throughout to auscultation.   No wheezes, crackles, or rhonchi. No acute distress. Heart:  Regular rate and rhythm; no murmurs, clicks, rubs, or gallops. Abdomen:  Normal bowel sounds.  No bruits.  Soft, non-tender and non-distended without masses, hepatosplenomegaly or hernias noted.  No guarding or rebound tenderness.  Negative Carnett sign.   Rectal:  Deferred.  Pulses:  Normal pulses noted. Extremities:  No clubbing or edema.  No cyanosis. Neurologic:  Alert and oriented x3;  grossly normal neurologically. Skin:  Intact without significant lesions or rashes.  No jaundice. Lymph Nodes:  No significant cervical adenopathy. Psych:  Alert and cooperative. Normal mood and affect.  Imaging  Studies: DG Foot Complete Left Result Date: 12/30/2024 Please see detailed radiograph report in office note.   Assessment and Plan:   Diana Galvan is a 59 y.o. y/o female with chronic constipation, straining, and blood in her stool.  She has never tried a prescription medication.  She has behavioral contraindications to use of Motegrity, so we will try Amitiza  8 mcg twice daily.  She is due for colonoscopy, so we will schedule that and she can report on her experience with Amitiza  at that time.  Because of her considerable straining and need for positional maneuvers to achieve bowel movement, I will refer her to pelvic floor physical therapy to evaluate and treat her for pelvic floor dyssynergia, if present.  Diana Schaffer, MD   Note: This dictation was prepared with Dragon dictation  along with smaller phrase technology. Any transcriptional errors that result from this process are unintentional.       [1]  Social History Tobacco Use   Smoking status: Some Days    Current packs/day: 0.25    Average packs/day: 0.3 packs/day for 38.0 years (9.5 ttl pk-yrs)    Types: Cigarettes    Passive exposure: Never   Smokeless tobacco: Never  Vaping Use   Vaping status: Former   Substances: Nicotine  Substance Use Topics   Alcohol use: Not Currently   Drug use: Not Currently    Types: Crack cocaine    Comment: Recovered 2001   "

## 2025-01-24 ENCOUNTER — Other Ambulatory Visit: Payer: Self-pay

## 2025-01-24 ENCOUNTER — Telehealth: Payer: Self-pay

## 2025-01-24 NOTE — Telephone Encounter (Signed)
 Approved [id number]  02-07-2025 the 05-08-2025

## 2025-01-26 ENCOUNTER — Encounter: Payer: Self-pay | Admitting: Family Medicine

## 2025-01-27 ENCOUNTER — Other Ambulatory Visit: Payer: Self-pay | Admitting: Family Medicine

## 2025-01-27 ENCOUNTER — Other Ambulatory Visit (HOSPITAL_COMMUNITY): Payer: Self-pay

## 2025-01-27 ENCOUNTER — Telehealth: Payer: Self-pay

## 2025-01-27 ENCOUNTER — Telehealth

## 2025-01-27 NOTE — Patient Instructions (Addendum)
 Visit Information  Ms. Heidler was given information about Medicaid Managed Care team care coordination services as a part of their [facility] Community Plan Medicaid benefit.   If you would like to schedule transportation through your [facility], please call the following number at least 2 days in advance of your appointment: [phone]   Rides for urgent appointments can also be made after hours by calling Member Services.  Call the Behavioral Health Crisis Line at [phone], at any time, 24 hours a day, 7 days a week. If you are in danger or need immediate medical attention call 911.   Social Worker will follow up on 02/14/25 at 1pm .   [name], BSW, MHA [place]  [facility] Social Worker, Population Health [phone]   Following is a copy of your plan of care:   Goals Addressed             This Visit's Progress    BSW Goals       Current SDOH Barriers:  Assistance with rent. Needing DME Rollator for work (Rollator has been received).  Interventions: SW sent resources for rent via email. SW sent PCP a message to inquire about sending a script for a rollator. SW asked about order placed for a scale and backbrace. SW sent another message to patients PCP. SW resend resources for rent via mail. Emailed information for [employer] Patient provided SW with update, she has an appointment scheduled with disability advocacy center on  2/10. Patient is now receiving foodstamps.   [name], HEDWIG, MHA [place]  [facility] Social Worker, Population Health [phone]

## 2025-01-27 NOTE — Telephone Encounter (Signed)
 Please see mychart message sent by pt and advise.

## 2025-01-27 NOTE — Telephone Encounter (Signed)
 Please see new message sent by pt and advise.

## 2025-01-27 NOTE — Patient Outreach (Signed)
 Social Drivers of Health  Community Resource and Care Coordination Visit Note   01/27/2025  Name: Diana Galvan MRN: [id number] DOB:02-19-1966  Situation: Referral received for [facility] needs assessment and assistance related to Housing  utilities. I obtained verbal consent from Patient.  Visit completed with Patient on the phone.   Background:      Assessment:   Goals Addressed             This Visit's Progress    BSW Goals       Current SDOH Barriers:  Assistance with rent. Needing DME Rollator for work (Rollator has been received).  Interventions: SW sent resources for rent via email. SW sent PCP a message to inquire about sending a script for a rollator. SW asked about order placed for a scale and backbrace. SW sent another message to patients PCP. SW resend resources for rent via mail. Emailed information for [employer] Patient provided SW with update, she has an appointment scheduled with disability advocacy center on  2/10. Patient is now receiving foodstamps.   Diana Galvan, BSW, MHA Diana Galvan  [facility] Social Worker, Population Health [phone]           Recommendation:   Follow up with disability advocacy center for appointment.  Follow Up Plan:   Telephone follow-up 02/14/25 at 1pm  Diana Galvan, BSW, [facility] Diana Galvan  Value Based Diana Galvan Social Worker, Population Health [phone]

## 2025-01-28 ENCOUNTER — Other Ambulatory Visit: Payer: Self-pay | Admitting: Family Medicine

## 2025-01-29 ENCOUNTER — Encounter: Admitting: Dietician

## 2025-01-29 ENCOUNTER — Other Ambulatory Visit: Payer: Self-pay | Admitting: Pain Medicine

## 2025-01-29 VITALS — Ht 67.0 in | Wt 262.6 lb

## 2025-01-29 DIAGNOSIS — M5134 Other intervertebral disc degeneration, thoracic region: Secondary | ICD-10-CM

## 2025-01-29 DIAGNOSIS — Z6841 Body Mass Index (BMI) 40.0 and over, adult: Secondary | ICD-10-CM

## 2025-01-29 DIAGNOSIS — M5030 Other cervical disc degeneration, unspecified cervical region: Secondary | ICD-10-CM

## 2025-01-29 NOTE — Addendum Note (Signed)
 Addended by: Orianna Biskup A on: 01/29/2025 12:46 PM   Modules accepted: Orders

## 2025-01-29 NOTE — Progress Notes (Signed)
(  01/29/2025) the patient came into the office today to have her lab work done including the UDS which she did not have done on her initial visit.  Unfortunately that also means that she has had time to prepare for this and therefore it is unlikely that we will be prescribing any type of controlled substances to this patient.  The patient's initial evaluation was on 01/14/2025 and at that time orders were placed for lab work and x-rays neither which the patient had done.  Today we have reminded the patient that she needs to have that completed as soon as possible so that radiology has time to read it before she comes in.

## 2025-01-29 NOTE — Progress Notes (Unsigned)
 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 Patient reports receiving SNAP benefits, financial strain. She is applying for disability.  Patient reports frequent abdominal pain, bloating, flatulence, sometimes soon after eating. She is scheduled for colonoscopy 02/07/25. Food allergies: none known Patient seeks help with weight loss in hopes of improving pain     Dietary Intake:  Usual eating pattern includes 1-2 meals and 1-2 snacks per day. Dining out frequency: 0-1 meals per week. Who plans meals/ buys groceries? self Who prepares meals? self  Breakfast: often late am or early pm 1-2 cups Activia yogurt/ oatmeal  Lunch: no meal; usually snacks Snack: usually 1 cup Activia yogurt or other snack Supper: sometimes full meal ie chicken thighs/ turkey wings + veg/ beans/ homemade soup Snack: sometimes same as pm Beverages: apple or apple-cran juice (trying to decrease), cirkul flavored water,   Physical activity: ADLs   Intervention:   Nutrition Care Education:   Basic nutrition: basic food groups; appropriate nutrient balance; appropriate meal and snack schedule; general nutrition guidelines    Weight control: importance of low sugar and low fat choices; portion control and appropriate food portions using food models; estimated  energy needs for weight loss at 1300 kcal, provided guidance for 45% CHO, 25% pro, (30% fat); examples of balanced meals using food lists, sample menus GI distress: advised decreasing juice intake and possibly yogurt intake as both can contribute to flatulence; did not advise increase in fiber intake at this time due to upcoming colonoscopy Other:  low cost food and meal options; local resources for self-selection food pantries  Other intervention notes: Abdominal pain and financial constraints seem to be affecting patient's food choices and eating behavior. She voices readiness to make some changes and confidence in ability to implement. Established goals and provided written list to patient.  will review colonoscopy results with patient at follow up visit and advise accordingly   Nutritional Diagnosis:  [place]-1.4 Altered GI function As related to symptoms of bloating, pressure, flatulence.  As evidenced by patient report of symptoms after most meals. Daniel-3.3 Overweight/obesity As related to excess calories, inadequate physical activity, emotional stress.  As evidenced by current BMI of 41. NI-5.11.1 Predicted suboptimal nutrient intake As related to limited variety of food choices.  As evidenced by some skipped meals, financial limitations, lack of meal planning.   Education Materials given:  Designer, Industrial/product with food lists, sample meal pattern Sample menus Snacking handout goals/ instructions   Learner/ who was taught:  Patient   Level of understanding: Verbalizes/ demonstrates competency  Demonstrated degree of understanding via:   Teach back Learning barriers: None  Willingness to learn/ readiness for change: Acceptance, ready for change   Monitoring and Evaluation:  Dietary intake, exercise, GI symptoms, and body weight      follow up: 02/19/25

## 2025-01-30 ENCOUNTER — Ambulatory Visit

## 2025-01-30 ENCOUNTER — Other Ambulatory Visit: Payer: Self-pay

## 2025-01-30 ENCOUNTER — Other Ambulatory Visit: Payer: Self-pay | Admitting: Licensed Clinical Social Worker

## 2025-01-30 DIAGNOSIS — K5900 Constipation, unspecified: Secondary | ICD-10-CM

## 2025-01-30 DIAGNOSIS — N3946 Mixed incontinence: Secondary | ICD-10-CM

## 2025-01-30 DIAGNOSIS — M25551 Pain in right hip: Secondary | ICD-10-CM

## 2025-01-30 LAB — COMP. METABOLIC PANEL (12)
AST: 52 [IU]/L — ABNORMAL HIGH (ref 0–40)
Albumin: 4.7 g/dL (ref 3.8–4.9)
Alkaline Phosphatase: 133 [IU]/L (ref 49–135)
BUN/Creatinine Ratio: 23 (ref 9–23)
BUN: 19 mg/dL (ref 6–24)
Bilirubin Total: 0.3 mg/dL (ref 0.0–1.2)
Calcium: 10.3 mg/dL — ABNORMAL HIGH (ref 8.7–10.2)
Chloride: 103 mmol/L (ref 96–106)
Creatinine, Ser: 0.82 mg/dL (ref 0.57–1.00)
Globulin, Total: 3.1 g/dL (ref 1.5–4.5)
Glucose: 82 mg/dL (ref 70–99)
Potassium: 4.8 mmol/L (ref 3.5–5.2)
Sodium: 142 mmol/L (ref 134–144)
Total Protein: 7.8 g/dL (ref 6.0–8.5)
eGFR: 82 mL/min/{1.73_m2}

## 2025-01-30 LAB — SEDIMENTATION RATE: Sed Rate: 27 mm/h (ref 0–40)

## 2025-01-30 LAB — MAGNESIUM: Magnesium: 2.1 mg/dL (ref 1.6–2.3)

## 2025-01-30 LAB — 25-HYDROXY VITAMIN D LCMS D2+D3

## 2025-01-30 LAB — VITAMIN B12: Vitamin B-12: 822 pg/mL (ref 232–1245)

## 2025-01-30 LAB — C-REACTIVE PROTEIN: CRP: 5 mg/L (ref 0–10)
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
## 2025-01-30 NOTE — Patient Instructions (Addendum)
 Plan to eat a meal or snack every 3-5 hours during the day. Wait at least 2-3 hours between snacks/ meals.  Include a protein food and at least one other food group (grain, veg, fruit) with each meal or snack.  Drink mostly water, decrease juice to 1 small glass a day ideally. Or add 1-2oz juice into a glass of water.  Begin some light exercise for a few (10) minutes as able, and gradually increase the time as energy and strength increase.

## 2025-01-31 NOTE — Patient Instructions (Signed)
 Visit Information  Diana Galvan was given information about Medicaid Managed Care team care coordination services as a part of their [facility] Community Plan Medicaid benefit.   If you would like to schedule transportation through your [facility], please call the following number at least 2 days in advance of your appointment: [phone]   Rides for urgent appointments can also be made after hours by calling Member Services.  Call the Behavioral Health Crisis Line at [phone], at any time, 24 hours a day, 7 days a week. If you are in danger or need immediate medical attention call 911.   Patient verbalizes understanding of instructions and care plan provided today and agrees to view in MyChart. Active MyChart status and patient understanding of how to access instructions and care plan via MyChart confirmed with patient.     Licensed Clinical Social Worker will follow up by phone on 02/19/25 at 11am.  Cena Ligas, LCSW Clinical Social Worker [facility]   Following is a copy of your plan of care:   Goals Addressed             This Visit's Progress    VBCI Social Work Care Plan LCSW

## 2025-01-31 NOTE — Patient Outreach (Signed)
 Complex Care Management   Visit Note  01/31/2025  Name:  Diana Galvan MRN: [id number] DOB: 1966-10-31  Situation: Referral received for Complex Care Management related to Mental/Behavioral Health diagnosis and SDOH Barriers:  Housing Financial Resource Strain I obtained verbal consent from Patient.  Visit completed with Patient  on the phone  Background:   Past Medical History:  Diagnosis Date   Asthma    Coronary artery disease    mild cad in mLAD on coronary CTA   Costochondritis    DDD (degenerative disc disease), lumbar    Depression    H/O blood clots 10/26/2002   History of Erb's palsy    Thyroid disease    Thyroid goiter     Assessment: LCSW spoke with patient over the phone. Patient continues to work with disability advocate to get SSDI approved. Patient reports being behind in rent but states her landlord works with her. Patient reports being in pain and has had first appointment at pain clinic. Patient stated that she still goes to [facility] for groups and she has 2x a month one on one sessions with a counselor. Patient has upcoming appointments with RNCM and BSW.  Patient Reported Symptoms:  Cognitive Cognitive Status: Normal speech and language skills, Alert and oriented to person, place, and time Cognitive/Intellectual Conditions Management [RPT]: None reported or documented in medical history or problem list   Health Maintenance Behaviors: Annual physical exam, Stress management Healing Pattern: Average Health Facilitated by: Healthy diet  Neurological Neurological Review of Symptoms: No symptoms reported Neurological Management Strategies: Routine screening  HEENT HEENT Symptoms Reported: No symptoms reported HEENT Management Strategies: Routine screening    Cardiovascular Cardiovascular Symptoms Reported: Not assessed    Respiratory Respiratory Symptoms Reported: Not assesed    Endocrine Endocrine Symptoms Reported: Not assessed    Gastrointestinal  Gastrointestinal Symptoms Reported: Not assessed      Genitourinary Genitourinary Symptoms Reported: Not assessed    Integumentary Integumentary Symptoms Reported: Not assessed    Musculoskeletal Musculoskelatal Symptoms Reviewed: Back pain, Difficulty walking, Joint pain, Limited mobility, Muscle pain, Weakness Additional Musculoskeletal Details: Patient is in pain in her back, and hips due medical diagnosis. Has appointment with chronic pain management. Musculoskeletal Management Strategies: Routine screening      Psychosocial Psychosocial Symptoms Reported: Depression - if selected complete PHQ 2-9          01/31/2025    PHQ2-9 Depression Screening   Little interest or pleasure in doing things    Feeling down, depressed, or hopeless    PHQ-2 - Total Score    Trouble falling or staying asleep, or sleeping too much    Feeling tired or having little energy    Poor appetite or overeating     Feeling bad about yourself - or that you are a failure or have let yourself or your family down    Trouble concentrating on things, such as reading the newspaper or watching television    Moving or speaking so slowly that other people could have noticed.  Or the opposite - being so fidgety or restless that you have been moving around a lot more than usual    Thoughts that you would be better off dead, or hurting yourself in some way    PHQ2-9 Total Score    If you checked off any problems, how difficult have these problems made it for you to do your work, take care of things at home, or get along with other people  Depression Interventions/Treatment      There were no vitals filed for this visit.    Medications Reviewed Today     Reviewed by Veva Bolt, LCSW (Social Worker) on 01/30/25 at 1051  Med List Status: <None>   Medication Order Taking? Sig Documenting Provider Last Dose Status Informant  ACCU-CHEK GUIDE TEST test strip [id number] Yes USE TO CHECK BLOOD SUGAR IN THE MORNING,  AT NOON, AND AT BEDTIME [provider]  Active   albuterol  (VENTOLIN  HFA) 108 (90 Base) MCG/ACT inhaler [id number] Yes Inhale 2 puffs into the lungs every 6 (six) hours as needed for wheezing or shortness of breath. Towana Small, FNP  Active   albuterol  (VENTOLIN  HFA) 108 (90 Base) MCG/ACT inhaler [id number]  Inhale 2 puffs into the lungs every 6 (six) hours as needed for wheezing or shortness of breath. Towana Small, FNP  Active   Blood Glucose Monitoring Suppl DEVI [id number] Yes 1 each by Does not apply route in the morning, at noon, and at bedtime. May substitute to any manufacturer covered by patient's insurance. Towana Small, FNP  Active   budesonide -formoterol  (SYMBICORT ) 160-4.5 MCG/ACT inhaler [id number] Yes Inhale 2 puffs into the lungs 2 (two) times daily. Towana Small, FNP  Active   budesonide -formoterol  (SYMBICORT ) 160-4.5 MCG/ACT inhaler [id number]  Inhale 2 puffs into the lungs 2 (two) times daily. Towana Small, FNP  Active   citalopram  (CELEXA ) 20 MG tablet [id number] Yes Take 1.5 tablets (30 mg total) by mouth daily.   Active   citalopram  (CELEXA ) 20 MG tablet [id number]  Take 1.5 tablets (30 mg total) by mouth daily.   Active   diclofenac  (CATAFLAM ) 50 MG tablet [id number] Yes Take 1 tablet (50 mg total) by mouth 3 (three) times daily. Mariah Thamas RAMAN, MD  Active   hydrOXYzine  (VISTARIL ) 25 MG capsule [id number] Yes Take 1 capsule (25 mg total) by mouth as needed for anxiety (up to three times daily).   Active   ketoconazole  (NIZORAL ) 2 % cream [id number] Yes Apply 1 Application topically daily. Silva Juliene SAUNDERS, DPM  Active   lubiprostone  (AMITIZA ) 8 MCG capsule [id number]  Take 1 capsule (8 mcg total) by mouth 2 (two) times daily with a meal. Melany Clotilda HERO, MD  Active   terbinafine  (LAMISIL ) 250 MG tablet [id number] Yes Take 1 tablet (250 mg total) by mouth daily. Silva Juliene SAUNDERS, DPM  Active   traZODone  (DESYREL ) 100 MG tablet [id number] Yes Take one tablet  (100mg ) by mouth one hour before bed   Active             Recommendation:   PCP Follow-up Continue Current Plan of Care  Follow Up Plan:   Telephone follow up appointment date/time:  02/19/25 11am.  Bolt Veva, LCSW Clinical Social Worker [facility]

## 2025-01-31 NOTE — Patient Outreach (Signed)
 Complex Care Management   Visit Note  01/31/2025  Name:  Diana Galvan MRN: [id number] DOB: [birth year]  Situation: Referral received for Complex Care Management related to Mental/Behavioral Health diagnosis. I obtained verbal consent from Patient.  Visit completed with Patient  on the phone  Background:   Past Medical History:  Diagnosis Date   Asthma    Coronary artery disease    mild cad in mLAD on coronary CTA   Costochondritis    DDD (degenerative disc disease), lumbar    Depression    H/O blood clots 10/26/2002   History of Erb's palsy    Thyroid disease    Thyroid goiter     Assessment: LCSW met with patient over the phone. Patient stated she had to reschedule her appointment with the disability advocate due to weather but she is looking forward to getting started. Patient shared this is the 5th time she has applied. Patient shared that she is thousands of dollars in rent but says her landlord is working with her. Patient stated she was able to get a letter to give to food stamps stating she can't meet the 20 hours of work a week due to her pain. Patient hopes to have her food stamps reinstated. Patient reports she is still going to group and having one on one sessions with her counselor with [facility]. Patient has follow up appointments with BSW and RNCM.   Patient Reported Symptoms:  Cognitive Cognitive Status: Normal speech and language skills, Alert and oriented to person, place, and time Cognitive/Intellectual Conditions Management [RPT]: None reported or documented in medical history or problem list   Health Maintenance Behaviors: Annual physical exam, Stress management Healing Pattern: Average Health Facilitated by: Healthy diet  Neurological Neurological Review of Symptoms: No symptoms reported Neurological Management Strategies: Routine screening  HEENT HEENT Symptoms Reported: No symptoms reported HEENT Management Strategies: Routine screening    Cardiovascular  Cardiovascular Symptoms Reported: Not assessed    Respiratory Respiratory Symptoms Reported: Not assesed    Endocrine Endocrine Symptoms Reported: Not assessed    Gastrointestinal Gastrointestinal Symptoms Reported: Not assessed      Genitourinary Genitourinary Symptoms Reported: Not assessed    Integumentary Integumentary Symptoms Reported: Not assessed    Musculoskeletal Musculoskelatal Symptoms Reviewed: Back pain, Difficulty walking, Joint pain, Limited mobility, Muscle pain, Weakness Additional Musculoskeletal Details: Patient is in pain in her back, and hips due medical diagnosis. Has appointment with chronic pain management. Musculoskeletal Management Strategies: Routine screening      Psychosocial Psychosocial Symptoms Reported: Depression - if selected complete PHQ 2-9          01/31/2025    PHQ2-9 Depression Screening   Little interest or pleasure in doing things    Feeling down, depressed, or hopeless    PHQ-2 - Total Score    Trouble falling or staying asleep, or sleeping too much    Feeling tired or having little energy    Poor appetite or overeating     Feeling bad about yourself - or that you are a failure or have let yourself or your family down    Trouble concentrating on things, such as reading the newspaper or watching television    Moving or speaking so slowly that other people could have noticed.  Or the opposite - being so fidgety or restless that you have been moving around a lot more than usual    Thoughts that you would be better off dead, or hurting yourself in some way  PHQ2-9 Total Score    If you checked off any problems, how difficult have these problems made it for you to do your work, take care of things at home, or get along with other people    Depression Interventions/Treatment      There were no vitals filed for this visit.    Medications Reviewed Today     Reviewed by Veva Bolt, LCSW (Social Worker) on 01/30/25 at 1051  Med List  Status: <None>   Medication Order Taking? Sig Documenting Provider Last Dose Status Informant  ACCU-CHEK GUIDE TEST test strip [id number] Yes USE TO CHECK BLOOD SUGAR IN THE MORNING, AT NOON, AND AT BEDTIME [provider]  Active   albuterol  (VENTOLIN  HFA) 108 (90 Base) MCG/ACT inhaler [id number] Yes Inhale 2 puffs into the lungs every 6 (six) hours as needed for wheezing or shortness of breath. Towana Small, FNP  Active   albuterol  (VENTOLIN  HFA) 108 [phone] Base) MCG/ACT inhaler [id number]  Inhale 2 puffs into the lungs every 6 (six) hours as needed for wheezing or shortness of breath. Towana Small, FNP  Active   Blood Glucose Monitoring Suppl DEVI [id number] Yes 1 each by Does not apply route in the morning, at noon, and at bedtime. May substitute to any manufacturer covered by patient's insurance. Towana Small, FNP  Active   budesonide -formoterol  (SYMBICORT ) 160-4.5 MCG/ACT inhaler [id number] Yes Inhale 2 puffs into the lungs 2 (two) times daily. Towana Small, FNP  Active   budesonide -formoterol  (SYMBICORT ) 160-4.5 MCG/ACT inhaler [id number]  Inhale 2 puffs into the lungs 2 (two) times daily. Towana Small, FNP  Active   citalopram  (CELEXA ) 20 MG tablet [id number] Yes Take 1.5 tablets (30 mg total) by mouth daily.   Active   citalopram  (CELEXA ) 20 MG tablet [id number]  Take 1.5 tablets (30 mg total) by mouth daily.   Active   diclofenac  (CATAFLAM ) 50 MG tablet [id number] Yes Take 1 tablet (50 mg total) by mouth 3 (three) times daily. Mariah Thamas RAMAN, MD  Active   hydrOXYzine  (VISTARIL ) 25 MG capsule [id number] Yes Take 1 capsule (25 mg total) by mouth as needed for anxiety (up to three times daily).   Active   ketoconazole  (NIZORAL ) 2 % cream [id number] Yes Apply 1 Application topically daily. Silva Juliene SAUNDERS, DPM  Active   lubiprostone  (AMITIZA ) 8 MCG capsule [id number]  Take 1 capsule (8 mcg total) by mouth 2 (two) times daily with a meal. Melany Clotilda HERO, MD  Active   terbinafine   (LAMISIL ) 250 MG tablet [id number] Yes Take 1 tablet (250 mg total) by mouth daily. Silva Juliene SAUNDERS, DPM  Active   traZODone  (DESYREL ) 100 MG tablet [id number] Yes Take one tablet (100mg ) by mouth one hour before bed   Active             Recommendation:   PCP Follow-up Continue Current Plan of Care  Follow Up Plan:   Telephone follow up appointment date/time:  02/19/25 at 11am.   Bolt Veva, LCSW Clinical Social Worker [facility]

## 2025-02-05 ENCOUNTER — Telehealth

## 2025-02-06 ENCOUNTER — Ambulatory Visit

## 2025-02-07 ENCOUNTER — Encounter: Admission: RE | Payer: Self-pay | Source: Home / Self Care

## 2025-02-07 ENCOUNTER — Ambulatory Visit: Admit: 2025-02-07 | Admitting: Gastroenterology

## 2025-02-10 ENCOUNTER — Ambulatory Visit: Admitting: Pain Medicine

## 2025-02-13 ENCOUNTER — Ambulatory Visit

## 2025-02-14 ENCOUNTER — Telehealth

## 2025-02-19 ENCOUNTER — Encounter: Admitting: Dietician

## 2025-02-19 ENCOUNTER — Telehealth: Admitting: Licensed Clinical Social Worker

## 2025-02-20 ENCOUNTER — Ambulatory Visit

## 2025-02-27 ENCOUNTER — Telehealth: Admitting: Licensed Clinical Social Worker

## 2025-02-27 ENCOUNTER — Ambulatory Visit

## 2025-03-03 ENCOUNTER — Ambulatory Visit

## 2025-03-04 ENCOUNTER — Ambulatory Visit

## 2025-03-06 ENCOUNTER — Ambulatory Visit

## 2025-03-13 ENCOUNTER — Ambulatory Visit

## 2025-03-20 ENCOUNTER — Ambulatory Visit

## 2025-03-27 ENCOUNTER — Ambulatory Visit

## 2025-04-03 ENCOUNTER — Ambulatory Visit

## 2025-04-10 ENCOUNTER — Ambulatory Visit

## 2025-04-17 ENCOUNTER — Ambulatory Visit

## 2025-04-24 ENCOUNTER — Ambulatory Visit
